# Patient Record
Sex: Female | Born: 1945
Health system: Southern US, Community
[De-identification: ages and names within clinical notes are randomized; demographics above are authoritative.]

## PROBLEM LIST (undated history)

## (undated) DIAGNOSIS — F329 Major depressive disorder, single episode, unspecified: Secondary | ICD-10-CM

## (undated) DIAGNOSIS — Z87898 Personal history of other specified conditions: Secondary | ICD-10-CM

## (undated) DIAGNOSIS — F419 Anxiety disorder, unspecified: Secondary | ICD-10-CM

## (undated) DIAGNOSIS — F32A Depression, unspecified: Secondary | ICD-10-CM

## (undated) DIAGNOSIS — H9190 Unspecified hearing loss, unspecified ear: Secondary | ICD-10-CM

## (undated) DIAGNOSIS — E785 Hyperlipidemia, unspecified: Secondary | ICD-10-CM

## (undated) DIAGNOSIS — J9600 Acute respiratory failure, unspecified whether with hypoxia or hypercapnia: Secondary | ICD-10-CM

## (undated) DIAGNOSIS — I10 Essential (primary) hypertension: Secondary | ICD-10-CM

## (undated) DIAGNOSIS — J302 Other seasonal allergic rhinitis: Secondary | ICD-10-CM

## (undated) DIAGNOSIS — I6529 Occlusion and stenosis of unspecified carotid artery: Secondary | ICD-10-CM

## (undated) DIAGNOSIS — I2510 Atherosclerotic heart disease of native coronary artery without angina pectoris: Secondary | ICD-10-CM

## (undated) DIAGNOSIS — IMO0001 Reserved for inherently not codable concepts without codable children: Secondary | ICD-10-CM

## (undated) DIAGNOSIS — R002 Palpitations: Secondary | ICD-10-CM

## (undated) DIAGNOSIS — Z9981 Dependence on supplemental oxygen: Secondary | ICD-10-CM

## (undated) DIAGNOSIS — J449 Chronic obstructive pulmonary disease, unspecified: Secondary | ICD-10-CM

## (undated) DIAGNOSIS — J441 Chronic obstructive pulmonary disease with (acute) exacerbation: Secondary | ICD-10-CM

## (undated) DIAGNOSIS — K219 Gastro-esophageal reflux disease without esophagitis: Secondary | ICD-10-CM

## (undated) DIAGNOSIS — R0989 Other specified symptoms and signs involving the circulatory and respiratory systems: Secondary | ICD-10-CM

## (undated) DIAGNOSIS — Z515 Encounter for palliative care: Secondary | ICD-10-CM

## (undated) DIAGNOSIS — D649 Anemia, unspecified: Secondary | ICD-10-CM

## (undated) DIAGNOSIS — I499 Cardiac arrhythmia, unspecified: Secondary | ICD-10-CM

## (undated) DIAGNOSIS — M810 Age-related osteoporosis without current pathological fracture: Secondary | ICD-10-CM

## (undated) HISTORY — DX: Chronic obstructive pulmonary disease with (acute) exacerbation: J44.1

## (undated) HISTORY — DX: Occlusion and stenosis of unspecified carotid artery: I65.29

## (undated) HISTORY — PX: VAGINAL HYSTERECTOMY: SUR661

## (undated) HISTORY — PX: TUBAL LIGATION: SHX77

## (undated) HISTORY — DX: Other seasonal allergic rhinitis: J30.2

## (undated) HISTORY — DX: Essential (primary) hypertension: I10

## (undated) HISTORY — PX: CATARACT EXTRACTION, BILATERAL: SHX1313

## (undated) HISTORY — DX: Encounter for palliative care: Z51.5

## (undated) HISTORY — DX: Chronic obstructive pulmonary disease, unspecified: J44.9

## (undated) HISTORY — DX: Other specified symptoms and signs involving the circulatory and respiratory systems: R09.89

## (undated) HISTORY — DX: Acute respiratory failure, unspecified whether with hypoxia or hypercapnia: J96.00

## (undated) HISTORY — DX: Hyperlipidemia, unspecified: E78.5

---

## 2008-01-01 ENCOUNTER — Ambulatory Visit: Payer: Self-pay | Admitting: Gynecology

## 2009-01-04 ENCOUNTER — Ambulatory Visit: Payer: Self-pay

## 2010-01-05 ENCOUNTER — Ambulatory Visit: Payer: Self-pay

## 2011-01-17 ENCOUNTER — Ambulatory Visit: Payer: Self-pay | Admitting: Family Medicine

## 2011-11-22 ENCOUNTER — Emergency Department: Payer: Self-pay | Admitting: Unknown Physician Specialty

## 2012-03-31 ENCOUNTER — Ambulatory Visit: Payer: Self-pay | Admitting: Physician Assistant

## 2013-05-12 ENCOUNTER — Ambulatory Visit: Payer: Self-pay | Admitting: Family Medicine

## 2014-02-25 ENCOUNTER — Inpatient Hospital Stay: Payer: Self-pay | Admitting: Specialist

## 2014-02-25 LAB — CBC
HCT: 38.8 % (ref 35.0–47.0)
HGB: 12.7 g/dL (ref 12.0–16.0)
MCH: 32.4 pg (ref 26.0–34.0)
MCHC: 32.8 g/dL (ref 32.0–36.0)
MCV: 99 fL (ref 80–100)
Platelet: 334 10*3/uL (ref 150–440)
RBC: 3.93 10*6/uL (ref 3.80–5.20)
RDW: 13.9 % (ref 11.5–14.5)
WBC: 17.1 10*3/uL — ABNORMAL HIGH (ref 3.6–11.0)

## 2014-02-25 LAB — COMPREHENSIVE METABOLIC PANEL
ALBUMIN: 2.9 g/dL — AB (ref 3.4–5.0)
ALK PHOS: 186 U/L — AB
Anion Gap: 6 — ABNORMAL LOW (ref 7–16)
BUN: 26 mg/dL — AB (ref 7–18)
Bilirubin,Total: 0.7 mg/dL (ref 0.2–1.0)
CALCIUM: 9.3 mg/dL (ref 8.5–10.1)
CHLORIDE: 101 mmol/L (ref 98–107)
Co2: 27 mmol/L (ref 21–32)
Creatinine: 0.73 mg/dL (ref 0.60–1.30)
EGFR (Non-African Amer.): >60
GLUCOSE: 118 mg/dL — AB (ref 65–99)
OSMOLALITY: 274 (ref 275–301)
Potassium: 3.5 mmol/L (ref 3.5–5.1)
SGOT(AST): 30 U/L (ref 15–37)
SGPT (ALT): 33 U/L (ref 12–78)
Sodium: 134 mmol/L — ABNORMAL LOW (ref 136–145)
Total Protein: 7.3 g/dL (ref 6.4–8.2)

## 2014-02-25 LAB — TROPONIN I: Troponin-I: <0.02 ng/mL

## 2014-02-26 LAB — BASIC METABOLIC PANEL
Anion Gap: 6 — ABNORMAL LOW (ref 7–16)
BUN: 19 mg/dL — ABNORMAL HIGH (ref 7–18)
CALCIUM: 9.2 mg/dL (ref 8.5–10.1)
Chloride: 104 mmol/L (ref 98–107)
Co2: 25 mmol/L (ref 21–32)
Creatinine: 0.55 mg/dL — ABNORMAL LOW (ref 0.60–1.30)
EGFR (Non-African Amer.): >60
Glucose: 125 mg/dL — ABNORMAL HIGH (ref 65–99)
OSMOLALITY: 274 (ref 275–301)
Potassium: 3.7 mmol/L (ref 3.5–5.1)
SODIUM: 135 mmol/L — AB (ref 136–145)

## 2014-02-26 LAB — CBC WITH DIFFERENTIAL/PLATELET
BASOS ABS: 0 10*3/uL (ref 0.0–0.1)
BASOS PCT: 0.1 %
Eosinophil #: 0 10*3/uL (ref 0.0–0.7)
Eosinophil %: 0.1 %
HCT: 35.8 % (ref 35.0–47.0)
HGB: 11.8 g/dL — ABNORMAL LOW (ref 12.0–16.0)
Lymphocyte #: 1.9 10*3/uL (ref 1.0–3.6)
Lymphocyte %: 13.2 %
MCH: 32.7 pg (ref 26.0–34.0)
MCHC: 32.9 g/dL (ref 32.0–36.0)
MCV: 99 fL (ref 80–100)
Monocyte #: 0.5 x10 3/mm (ref 0.2–0.9)
Monocyte %: 3.8 %
Neutrophil #: 11.9 10*3/uL — ABNORMAL HIGH (ref 1.4–6.5)
Neutrophil %: 82.8 %
Platelet: 313 10*3/uL (ref 150–440)
RBC: 3.6 10*6/uL — AB (ref 3.80–5.20)
RDW: 13.9 % (ref 11.5–14.5)
WBC: 14.4 10*3/uL — AB (ref 3.6–11.0)

## 2014-02-26 LAB — LIPID PANEL
CHOLESTEROL: 110 mg/dL (ref 0–200)
HDL Cholesterol: 13 mg/dL — ABNORMAL LOW (ref 40–60)
Ldl Cholesterol, Calc: 57 mg/dL (ref 0–100)
TRIGLYCERIDES: 199 mg/dL (ref 0–200)
VLDL Cholesterol, Calc: 40 mg/dL (ref 5–40)

## 2014-02-26 LAB — MAGNESIUM: Magnesium: 2.1 mg/dL

## 2014-02-26 LAB — TSH: Thyroid Stimulating Horm: 0.193 u[IU]/mL — ABNORMAL LOW

## 2014-03-01 LAB — EXPECTORATED SPUTUM ASSESSMENT W GRAM STAIN, RFLX TO RESP C

## 2014-03-02 LAB — CULTURE, BLOOD (SINGLE)

## 2014-03-26 ENCOUNTER — Ambulatory Visit: Payer: Self-pay | Admitting: Family Medicine

## 2015-03-11 DIAGNOSIS — J302 Other seasonal allergic rhinitis: Secondary | ICD-10-CM | POA: Diagnosis not present

## 2015-03-11 DIAGNOSIS — R0989 Other specified symptoms and signs involving the circulatory and respiratory systems: Secondary | ICD-10-CM | POA: Diagnosis not present

## 2015-03-11 DIAGNOSIS — J449 Chronic obstructive pulmonary disease, unspecified: Secondary | ICD-10-CM | POA: Diagnosis not present

## 2015-03-11 DIAGNOSIS — I10 Essential (primary) hypertension: Secondary | ICD-10-CM | POA: Diagnosis not present

## 2015-03-11 DIAGNOSIS — F418 Other specified anxiety disorders: Secondary | ICD-10-CM | POA: Diagnosis not present

## 2015-03-11 DIAGNOSIS — R42 Dizziness and giddiness: Secondary | ICD-10-CM | POA: Diagnosis not present

## 2015-03-11 LAB — BASIC METABOLIC PANEL
BUN: 8 mg/dL (ref 4–21)
Creatinine: 0.6 mg/dL (ref 0.5–1.1)
GLUCOSE: 107 mg/dL
Potassium: 5.4 mmol/L — AB (ref 3.4–5.3)
SODIUM: 137 mmol/L (ref 137–147)

## 2015-03-11 LAB — CBC AND DIFFERENTIAL
HEMATOCRIT: 45 % (ref 36–46)
Hemoglobin: 15 g/dL (ref 12.0–16.0)
NEUTROS ABS: 62 /uL
PLATELETS: 362 10*3/uL (ref 150–399)
WBC: 9.2 10^3/mL

## 2015-03-11 LAB — HEPATIC FUNCTION PANEL
ALT: 11 U/L (ref 7–35)
AST: 16 U/L (ref 13–35)
Alkaline Phosphatase: 97 U/L (ref 25–125)
BILIRUBIN, TOTAL: 0.2 mg/dL

## 2015-03-17 ENCOUNTER — Ambulatory Visit
Admit: 2015-03-17 | Disposition: A | Discharge status: Home / Self Care | Payer: Self-pay | Attending: Family Medicine | Admitting: Family Medicine

## 2015-03-17 DIAGNOSIS — R0989 Other specified symptoms and signs involving the circulatory and respiratory systems: Secondary | ICD-10-CM | POA: Diagnosis not present

## 2015-03-17 DIAGNOSIS — I6523 Occlusion and stenosis of bilateral carotid arteries: Secondary | ICD-10-CM | POA: Diagnosis not present

## 2015-03-17 DIAGNOSIS — R42 Dizziness and giddiness: Secondary | ICD-10-CM | POA: Diagnosis not present

## 2015-03-25 DIAGNOSIS — J449 Chronic obstructive pulmonary disease, unspecified: Secondary | ICD-10-CM | POA: Diagnosis not present

## 2015-03-25 DIAGNOSIS — G459 Transient cerebral ischemic attack, unspecified: Secondary | ICD-10-CM | POA: Diagnosis not present

## 2015-03-25 DIAGNOSIS — I6529 Occlusion and stenosis of unspecified carotid artery: Secondary | ICD-10-CM | POA: Diagnosis not present

## 2015-03-25 DIAGNOSIS — F17200 Nicotine dependence, unspecified, uncomplicated: Secondary | ICD-10-CM | POA: Diagnosis not present

## 2015-04-02 NOTE — Discharge Summary (Signed)
PATIENT NAME:  Hailey Gibbs, Hailey Gibbs MR#:  [id number] DATE OF BIRTH:  1946-10-17  DATE OF ADMISSION:  02/25/2014 DATE OF DISCHARGE:  02/28/2014  For a detailed note, please take a look at the history and physical done on admission by Dr. Imogene Burnhen.   DIAGNOSES AT DISCHARGE: As follows: 1.  Chronic obstructive pulmonary disease exacerbation.  2.  Acute bronchitis.  3.  Hypertension.  4.  Gastroesophageal reflux disease.  5.  Depression.   DIET: The patient is being discharged on a low-sodium diet.   ACTIVITY: As tolerated.   FOLLOWUP: With Dr. Joanie Coddingtonennis Crissman in the next 1 to 2 weeks.   DISCHARGE MEDICATIONS:  Albuterol inhaler 1 to 2 puffs q.i.d. as needed, lisinopril 5 mg daily, Zyrtec 10 mg daily, Zoloft 50 mg at bedtime, multivitamin daily, prednisone taper starting at 60 mg down to 10 mg over the next 6 days, Levaquin 5 mg daily x 5 days, Spiriva 1 puff daily and Symbicort 160/4.5, 2 puffs b.i.d.   PERTINENT STUDIES DONE DURING THE HOSPITAL COURSE: As follows: A chest x-ray done on admission showing mild interstitial prominence bilaterally, suspicious for edema or pneumonitis and hyperinflation. Blood cultures and sputum cultures noted to be negative.   HOSPITAL COURSE: This is a 69 year old female with medical problems as mentioned above, presented to the hospital with shortness of breath, wheezing and noted to be in COPD exacerbation.  1.  COPD exacerbation. This would likely cause the patient's shortness of breath, wheezing and cough. This was likely secondary to ongoing tobacco abuse and possible underlying bronchitis. The patient's chest x-ray did not show any evidence of focal infiltrate. The patient was started on aggressive therapy with IV steroids, around-the-clock nebulizer treatments, also started on IV Levaquin. The patient also started on some Spiriva. The patient's clinical symptoms have significantly improved since admission. She is less bronchospastic and wheezing. She was  ambulated on room air. Did desaturate below 88%; therefore, qualifies for home oxygen, which is being arranged for her. At this point, since she is clinically feeling much better, she is being discharged home on oral prednisone taper along with Levaquin and oxygen at home. She will also continue some Symbicort and Spiriva and have follow up with her primary care physician as an outpatient.  2.  Hypertension. The patient remained hemodynamically stable. She will continue her lisinopril.  3.  Depression. The patient was maintained on her Zoloft and she will resume that.  4.  Tobacco abuse. The patient was strongly advised to quit smoking and was maintained on nicotine patch while in the hospital.  5.  GERD. The patient was maintained on her Protonix and she will resume that.   CODE STATUS: The patient is a full code.   TIME SPENT ON DISCHARGE: 40 minutes. ____________________________ Rolly PancakeVivek J. Cherlynn KaiserSainani, MD vjs:aw D: 02/28/2014 16:12:24 ET T: 03/01/2014 06:45:36 ET JOB#: [id number]  cc: Rolly PancakeVivek J. Cherlynn KaiserSainani, MD, <Dictator> Joanie CoddingtonENNIS CRISSMAN, MD Houston SirenVIVEK J Stephan Draughn MD ELECTRONICALLY SIGNED 03/05/2014 20:27

## 2015-04-02 NOTE — H&P (Signed)
PATIENT NAME:  Hailey Gibbs, Hailey Gibbs MR#:  [id number] DATE OF BIRTH:  10-06-1946  DATE OF ADMISSION:  02/25/2014  PRIMARY CARE PHYSICIAN: Dr. Shella Spearing  REFERRING PHYSICIAN: Dr. Scotty Court  CHIEF COMPLAINT: Shortness of breath, cough, sputum 4 days.   HISTORY OF PRESENT ILLNESS: A 69 year old Caucasian female with a history of COPD, hypertension, depression, anxiety presenting  to the ED with the above chief complaint. The patient is alert, awake, oriented, in no acute distress. The patient said she started to have shortness of breath, cough, sputum, four days ago. These symptoms have been worsening, so she came to the ED for further evaluation today. The patient chest x-ray showed pneumonia. The patient's ABG showed, pO2 of 56. The patient was treated with nebulizer and put on BiPAP.  The patient was also treated with Levaquin. Blood culture were sent.   PAST MEDICAL HISTORY: COPD, hypertension, depression, anxiety.   SOCIAL HISTORY: Smokes 1 pack a day for 30 years   PAST SURGICAL HISTORY: Hysterectomy.   FAMILY HISTORY: Mother has dementia, COPD, deceased. Brother has Parkinson disease. Father had coronary artery disease and died of pneumonia.   HOME MEDICATIONS: Zyrtec 10 mg p.o. daily, sertraline 50 mg p.o. at bedtime.  ProAir HFA CFC 90 mcg 1 to 2 tabs 4 times a day p.r.n., multivitamin 1 tablet p.o. daily, lisinopril 5 mg p.o. daily, Dulera 5/200 mcg inhalation 1 to 2 tabs b.i.d.   REVIEW OF SYSTEMS:  CONSTITUTIONAL: The patient denies any fever, chills. No headache or dizziness, but feels hot.  CARDIOVASCULAR: No chest pain, palpitation, orthopnea, no nocturnal dyspnea. No leg edema.  EYES: No double vision, blurred vision.  ENT: No postnasal drip, slurred speech or dysphagia.  PULMONARY: Cough, sputum, shortness of breath, wheezing, but no hemoptysis.  GASTROINTESTINAL: No abdominal pain, nausea, vomiting, diarrhea.  GENITOURINARY: No dysuria, hematuria, or incontinence.  SKIN: No  rash or jaundice.  NEUROLOGIC: No syncope, loss of consciousness or seizure.  ENDOCRINOLOGY: No polyuria, polydipsia, heat or cold intolerance.  HEMATOLOGY: No easy bruising or bleeding.   PHYSICAL EXAMINATION: VITAL SIGNS: Temperature 97.9, blood pressure 114/52, pulse 126, respirations 28, oxygen saturation 90% on oxygen.  GENERAL: The patient is alert, awake, oriented, in no acute distress.  HEENT: Pupils round, equal, and reactive to light and accommodation.  NECK: Supple. No JVD or carotid bruit. No lymphadenopathy. No thyromegaly.  CARDIOVASCULAR: S1 and S2, regular rate and tachycardia. No murmurs or gallops.  PULMONARY: Very weak breath sounds with expiratory wheezing and use of accessory muscle to breathe.  ABDOMEN: Soft. No distention or tenderness. No organomegaly. Bowel sounds present.  EXTREMITIES: No edema, clubbing or cyanosis. No calf tenderness and pedal pulses present.  SKIN: No rash or jaundice.  NEUROLOGIC: A and O x 3. No focal deficit. Power 5/5. Sensation intact.   LABORATORY AND DIAGNOSTICS: The chest x-ray showed  Mild interstitial prominence bilaterally suspicious for edema or pneumonitis. Streaky bilateral basilar atelectasis or early infiltrate.  ABG showed pH 7.3, pCO2 of 49, pO2 56 with FiO2 of 36, lactic acid 1.2. WBC 7.1, hemoglobin 12.7, platelets 334. Glucose 118, BUN 26, creatinine 0.7, sodium 134, potassium 3.5, chloride 101, bicarbonate 27. Troponin less than 0.02. ABG shows sinus tachycardia at 121 BPM.   IMPRESSION: 1. Acute respiratory failure with chronic obstructive pulmonary disease exacerbation.  2. Systemic inflammatory response syndrome.  3. Pneumonia.  4. Dehydration.  5. Hypertension.  6. Tobacco abuse.   PLAN OF TREATMENT: 1. The patient will be admitted to medical floor with  a BiPAP p.r.n. give Xopenex p.r.n. and start some Solu-Medrol.  Continue Levaquin. Follow-up CBC, blood culture, and sputum culture.  2. For dehydration, will give  normal saline IV. Follow up BMP. 3.  Hypertension. We will continue lisinopril.  4. Tobacco abuse, smoking cessation was counseled for five minutes. We will give nicotine patch.  5. I discussed the patient's critical condition with the patient.   TIME SPENT: About 56 minutes.    ____________________________ Shaune PollackQing Yoshi Vicencio, MD qc:sg D: 02/25/2014 13:50:00 ET T: 02/25/2014 14:20:50 ET JOB#: [id number]  cc: Shaune PollackQing Shomari Matusik, MD, <Dictator> Shaune PollackQING Detrick Dani MD ELECTRONICALLY SIGNED 02/26/2014 14:36

## 2015-04-08 ENCOUNTER — Ambulatory Visit
Admit: 2015-04-08 | Disposition: A | Discharge status: Home / Self Care | Payer: Self-pay | Attending: Vascular Surgery | Admitting: Vascular Surgery

## 2015-04-08 DIAGNOSIS — I672 Cerebral atherosclerosis: Secondary | ICD-10-CM | POA: Diagnosis not present

## 2015-04-08 DIAGNOSIS — I6523 Occlusion and stenosis of bilateral carotid arteries: Secondary | ICD-10-CM | POA: Diagnosis not present

## 2015-04-08 DIAGNOSIS — I6522 Occlusion and stenosis of left carotid artery: Secondary | ICD-10-CM | POA: Diagnosis not present

## 2015-04-08 DIAGNOSIS — I708 Atherosclerosis of other arteries: Secondary | ICD-10-CM | POA: Diagnosis not present

## 2015-04-08 DIAGNOSIS — I6521 Occlusion and stenosis of right carotid artery: Secondary | ICD-10-CM | POA: Diagnosis not present

## 2015-04-12 DIAGNOSIS — F17200 Nicotine dependence, unspecified, uncomplicated: Secondary | ICD-10-CM | POA: Diagnosis not present

## 2015-04-12 DIAGNOSIS — I6529 Occlusion and stenosis of unspecified carotid artery: Secondary | ICD-10-CM | POA: Diagnosis not present

## 2015-04-12 DIAGNOSIS — J449 Chronic obstructive pulmonary disease, unspecified: Secondary | ICD-10-CM | POA: Diagnosis not present

## 2015-04-12 DIAGNOSIS — G459 Transient cerebral ischemic attack, unspecified: Secondary | ICD-10-CM | POA: Diagnosis not present

## 2015-05-30 ENCOUNTER — Ambulatory Visit (INDEPENDENT_AMBULATORY_CARE_PROVIDER_SITE_OTHER): Payer: Commercial Managed Care - HMO | Admitting: Cardiovascular Disease

## 2015-05-30 ENCOUNTER — Encounter (INDEPENDENT_AMBULATORY_CARE_PROVIDER_SITE_OTHER): Payer: Self-pay

## 2015-05-30 ENCOUNTER — Encounter: Payer: Self-pay | Admitting: Cardiovascular Disease

## 2015-05-30 VITALS — BP 122/62 | HR 77 | Ht 65.0 in | Wt 142.5 lb

## 2015-05-30 DIAGNOSIS — R0602 Shortness of breath: Secondary | ICD-10-CM | POA: Diagnosis not present

## 2015-05-30 DIAGNOSIS — E785 Hyperlipidemia, unspecified: Secondary | ICD-10-CM | POA: Insufficient documentation

## 2015-05-30 DIAGNOSIS — Z01810 Encounter for preprocedural cardiovascular examination: Secondary | ICD-10-CM

## 2015-05-30 DIAGNOSIS — Z720 Tobacco use: Secondary | ICD-10-CM | POA: Insufficient documentation

## 2015-05-30 DIAGNOSIS — Z7189 Other specified counseling: Secondary | ICD-10-CM | POA: Insufficient documentation

## 2015-05-30 NOTE — Progress Notes (Signed)
HPI  This is a 69 year old pleasant female who was referred by Dr. Wyn Quaker for preoperative cardiovascular evaluation before left carotid endarterectomy. The patient has known history of COPD, prolonged tobacco use and hyperlipidemia. She has no previous cardiac history. She was suspected of having TIA recently and underwent carotid Doppler which showed significant left carotid stenosis and moderate right carotid stenosis. The patient denies any chest discomfort. She has chronic exertional dyspnea which she thinks related to COPD. Her functional capacity is reduced due to her lung disease but overall she is able to perform activities of daily living she does have high work without significant limitations.  There is no family history of premature coronary artery disease.  No Known Allergies   No current outpatient prescriptions on file prior to visit.   No current facility-administered medications on file prior to visit.     Past Medical History  Diagnosis Date  . Carotid artery occlusion   . Hypertension   . COPD (chronic obstructive pulmonary disease)   . Seasonal allergies   . Hyperlipidemia      Past Surgical History  Procedure Laterality Date  . Vaginal hysterectomy       Family History  Problem Relation Age of Onset  . Heart disease Father 63    CABG   . Hyperlipidemia Father      History   Social History  . Marital Status: Divorced    Spouse Name: N/A  . Number of Children: N/A  . Years of Education: N/A   Occupational History  . Not on file.   Social History Main Topics  . Smoking status: Current Every Day Smoker -- 1.00 packs/day for 45 years    Types: Cigarettes  . Smokeless tobacco: Not on file  . Alcohol Use: No  . Drug Use: No  . Sexual Activity: Not on file   Other Topics Concern  . Not on file   Social History Narrative  . No narrative on file     ROS A 10 point review of system was performed. It is negative other than that mentioned in  the history of present illness.   PHYSICAL EXAM   BP 122/62 mmHg  Pulse 77  Ht 5\' 5"  (1.651 m)  Wt 142 lb 8 oz (64.638 kg)  BMI 23.71 kg/m2 Constitutional: She is oriented to person, place, and time. She appears well-developed and well-nourished. No distress.  HENT: No nasal discharge.  Head: Normocephalic and atraumatic.  Eyes: Pupils are equal and round. No discharge.  Neck: Normal range of motion. Neck supple. No JVD present. No thyromegaly present. There is a faint right carotid bruit Cardiovascular: Normal rate, regular rhythm, normal heart sounds. Exam reveals no gallop and no friction rub. No murmur heard.  Pulmonary/Chest: Effort normal and breath sounds normal. No stridor. No respiratory distress. She has no wheezes. She has no rales. She exhibits no tenderness.  Abdominal: Soft. Bowel sounds are normal. She exhibits no distension. There is no tenderness. There is no rebound and no guarding.  Musculoskeletal: Normal range of motion. She exhibits no edema and no tenderness.  Neurological: She is alert and oriented to person, place, and time. Coordination normal.  Skin: Skin is warm and dry. No rash noted. She is not diaphoretic. No erythema. No pallor.  Psychiatric: She has a normal mood and affect. Her behavior is normal. Judgment and thought content normal.     NGE:XBMWU  Rhythm  -  Nonspecific T-abnormality.   ABNORMAL  ASSESSMENT AND PLAN

## 2015-05-30 NOTE — Assessment & Plan Note (Signed)
Given recent diagnosis of carotid atherosclerosis, I agree with treatment with a statin with a target LDL of less than 70.

## 2015-05-30 NOTE — Assessment & Plan Note (Signed)
I discussed with the patient the importance of smoking cessation but she is not able to quit at the present time.

## 2015-05-30 NOTE — Patient Instructions (Addendum)
Medication Instructions:  Your physician recommends that you continue on your current medications as directed. Please refer to the Current Medication list given to you today.   Labwork: none  Testing/Procedures: Your physician has requested that you have a lexiscan myoview.   [facility] MYOVIEW  Your caregiver has ordered a Stress Test with nuclear imaging. The purpose of this test is to evaluate the blood supply to your heart muscle. This procedure is referred to as a "Non-Invasive Stress Test." This is because other than having an IV started in your vein, nothing is inserted or "invades" your body. Cardiac stress tests are done to find areas of poor blood flow to the heart by determining the extent of coronary artery disease (CAD). Some patients exercise on a treadmill, which naturally increases the blood flow to your heart, while others who are  unable to walk on a treadmill due to physical limitations have a pharmacologic/chemical stress agent called Lexiscan . This medicine will mimic walking on a treadmill by temporarily increasing your coronary blood flow.   Please note: these test may take anywhere between 2-4 hours to complete  PLEASE REPORT TO [facility] MEDICAL MALL ENTRANCE  THE VOLUNTEERS AT THE FIRST DESK WILL DIRECT YOU WHERE TO GO  Date of Procedure: Thursday, June 23, 8:00am  Arrival Time for Procedure: 7:30am _ Instructions regarding medication:   PLEASE NOTIFY THE OFFICE AT LEAST 24 HOURS IN ADVANCE IF YOU ARE UNABLE TO KEEP YOUR APPOINTMENT.  [phone] AND  PLEASE NOTIFY NUCLEAR MEDICINE AT [facility] AT LEAST 24 HOURS IN ADVANCE IF YOU ARE UNABLE TO KEEP YOUR APPOINTMENT. [phone]  How to prepare for your Myoview test:  1. Do not eat or drink after midnight 2. No caffeine for 24 hours prior to test 3. No smoking 24 hours prior to test. 4. Your medication may be taken with water.  If your doctor stopped a medication because of this test, do not take that  medication. 5. Ladies, please do not wear dresses.  Skirts or pants are appropriate. Please wear a short sleeve shirt. 6. No perfume, cologne or lotion. 7. Wear comfortable walking shoes. No heels!            Follow-Up: Your physician recommends that you schedule a follow-up appointment as needed with Dr. Kirke Corin.    Any Other Special Instructions Will Be Listed Below (If Applicable).

## 2015-05-30 NOTE — Assessment & Plan Note (Signed)
The patient has chronic exertional dyspnea which could be related to COPD. However, she has not had any recent stress testing. She has multiple risk factors for coronary artery disease and has already established history of atherosclerosis manifested by significant carotid stenosis. Functional capacity is reduced due to exertional dyspnea. Physical exam does not reveal any cardiac murmurs. ECG with nonspecific T wave changes. I requested a pharmacologic nuclear stress test for risk stratification. She is not able to exercise on a treadmill due to COPD. If stress test is low risk, the patient can proceed with surgery at an overall low risk from a cardiac standpoint.

## 2015-06-02 ENCOUNTER — Encounter
Admission: RE | Admit: 2015-06-02 | Discharge: 2015-06-02 | Disposition: A | Discharge status: Home / Self Care | Payer: Commercial Managed Care - HMO | Source: Ambulatory Visit | Attending: Cardiovascular Disease | Admitting: Cardiovascular Disease

## 2015-06-02 DIAGNOSIS — R0602 Shortness of breath: Secondary | ICD-10-CM | POA: Insufficient documentation

## 2015-06-02 LAB — NM MYOCAR MULTI W/SPECT W/WALL MOTION / EF
CHL CUP RESTING HR STRESS: 82 {beats}/min
LV dias vol: 51 mL
LV sys vol: 21 mL
NUC STRESS TID: 1.02
Peak HR: 104 {beats}/min
Percent HR: 68 %
SDS: 3
SRS: 4
SSS: 0

## 2015-06-02 MED ORDER — TECHNETIUM TC 99M SESTAMIBI - CARDIOLITE
29.8300 | Freq: Once | INTRAVENOUS | Status: AC | PRN
  Administered 2015-06-02: 09:00:00 29.83 via INTRAVENOUS

## 2015-06-02 MED ORDER — TECHNETIUM TC 99M SESTAMIBI - CARDIOLITE
12.2650 | Freq: Once | INTRAVENOUS | Status: AC | PRN
  Administered 2015-06-02: 12.265 via INTRAVENOUS

## 2015-06-02 MED ORDER — REGADENOSON 0.4 MG/5ML IV SOLN
0.4000 mg | Freq: Once | INTRAVENOUS | Status: AC
  Administered 2015-06-02: 0.4 mg via INTRAVENOUS

## 2015-06-14 ENCOUNTER — Other Ambulatory Visit: Payer: Self-pay | Admitting: Family Medicine

## 2015-06-16 ENCOUNTER — Encounter
Admission: RE | Admit: 2015-06-16 | Discharge: 2015-06-16 | Disposition: A | Discharge status: Home / Self Care | Payer: Commercial Managed Care - HMO | Source: Ambulatory Visit | Attending: Vascular Surgery | Admitting: Vascular Surgery

## 2015-06-16 DIAGNOSIS — E785 Hyperlipidemia, unspecified: Secondary | ICD-10-CM | POA: Diagnosis not present

## 2015-06-16 DIAGNOSIS — Z01812 Encounter for preprocedural laboratory examination: Secondary | ICD-10-CM | POA: Insufficient documentation

## 2015-06-16 DIAGNOSIS — I6529 Occlusion and stenosis of unspecified carotid artery: Secondary | ICD-10-CM | POA: Diagnosis not present

## 2015-06-16 DIAGNOSIS — J449 Chronic obstructive pulmonary disease, unspecified: Secondary | ICD-10-CM | POA: Diagnosis not present

## 2015-06-16 DIAGNOSIS — F329 Major depressive disorder, single episode, unspecified: Secondary | ICD-10-CM | POA: Diagnosis not present

## 2015-06-16 DIAGNOSIS — F17200 Nicotine dependence, unspecified, uncomplicated: Secondary | ICD-10-CM | POA: Diagnosis not present

## 2015-06-16 DIAGNOSIS — Z01810 Encounter for preprocedural cardiovascular examination: Secondary | ICD-10-CM | POA: Diagnosis not present

## 2015-06-16 DIAGNOSIS — I10 Essential (primary) hypertension: Secondary | ICD-10-CM | POA: Diagnosis not present

## 2015-06-16 DIAGNOSIS — G459 Transient cerebral ischemic attack, unspecified: Secondary | ICD-10-CM | POA: Diagnosis not present

## 2015-06-16 HISTORY — DX: Gastro-esophageal reflux disease without esophagitis: K21.9

## 2015-06-16 HISTORY — DX: Depression, unspecified: F32.A

## 2015-06-16 HISTORY — DX: Major depressive disorder, single episode, unspecified: F32.9

## 2015-06-16 HISTORY — DX: Reserved for inherently not codable concepts without codable children: IMO0001

## 2015-06-16 HISTORY — DX: Anxiety disorder, unspecified: F41.9

## 2015-06-16 LAB — CBC
HCT: 44.1 % (ref 35.0–47.0)
HEMOGLOBIN: 14.3 g/dL (ref 12.0–16.0)
MCH: 31.8 pg (ref 26.0–34.0)
MCHC: 32.5 g/dL (ref 32.0–36.0)
MCV: 97.8 fL (ref 80.0–100.0)
Platelets: 335 10*3/uL (ref 150–440)
RBC: 4.51 MIL/uL (ref 3.80–5.20)
RDW: 13.6 % (ref 11.5–14.5)
WBC: 8.7 10*3/uL (ref 3.6–11.0)

## 2015-06-16 LAB — PROTIME-INR
INR: 0.87
Prothrombin Time: 12 seconds (ref 11.4–15.0)

## 2015-06-16 LAB — TYPE AND SCREEN
ABO/RH(D): A POS
ANTIBODY SCREEN: NEGATIVE

## 2015-06-16 LAB — BASIC METABOLIC PANEL
Anion gap: 8 (ref 5–15)
BUN: 9 mg/dL (ref 6–20)
CALCIUM: 9.7 mg/dL (ref 8.9–10.3)
CHLORIDE: 102 mmol/L (ref 101–111)
CO2: 29 mmol/L (ref 22–32)
Creatinine, Ser: 0.66 mg/dL (ref 0.44–1.00)
GFR calc non Af Amer: >60 mL/min (ref >60)
Glucose, Bld: 106 mg/dL — ABNORMAL HIGH (ref 65–99)
Potassium: 4.1 mmol/L (ref 3.5–5.1)
SODIUM: 139 mmol/L (ref 135–145)

## 2015-06-16 LAB — ABO/RH: ABO/RH(D): A POS

## 2015-06-16 LAB — APTT: aPTT: 31 seconds (ref 24–36)

## 2015-06-16 NOTE — Patient Instructions (Signed)
  Your procedure is scheduled on: June 23, 2015 (Thursday) Report to Day Surgery. To find out your arrival time please call [phone] between 1PM - 3PM on June 22, 2015 (Wednesday).  Remember: Instructions that are not followed completely may result in serious medical risk, up to and including death, or upon the discretion of your surgeon and anesthesiologist your surgery may need to be rescheduled.    __x__ 1. Do not eat food or drink liquids after midnight. No gum chewing or hard candies.     ____ 2. No Alcohol for 24 hours before or after surgery.   ____ 3. Bring all medications with you on the day of surgery if instructed.    __x__ 4. Notify your doctor if there is any change in your medical condition     (cold, fever, infections).     Do not wear jewelry, make-up, hairpins, clips or nail polish.  Do not wear lotions, powders, or perfumes. You may wear deodorant.  Do not shave 48 hours prior to surgery. Men may shave face and neck.  Do not bring valuables to the hospital.    [facility] not [facility] any belongings or valuables.               Contacts, dentures or bridgework may not be worn into surgery.  Leave your suitcase in the car. After surgery it may be brought to your room.  For patients admitted to the hospital, discharge time is determined by your                treatment team.   Patients discharged the day of surgery will not be allowed to drive home.   Please read over the following fact sheets that you were given:   Surgical Site Infection Prevention   ____ Take these medicines the morning of surgery with A SIP OF WATER:    1. Atorvastatin  2. Zantac (Zantac at bedtime on July 13)  3.   4.  5.  6.  ____ Fleet Enema (as directed)   __x__ Use CHG Soap as directed  __x__ Use inhalers on the day of surgery (Ventolin, Spiriva, and Symbicort, bring to hospital)  ____ Stop metformin 2 days prior to surgery    ____ Take 1/2 of usual insulin dose  the night before surgery and none on the morning of surgery.   __x__ Stop Coumadin/Plavix/aspirin on (Continue aspirin ,but do not take day of surgery)  ____ Stop Anti-inflammatories on    ____ Stop supplements until after surgery.    ____ Bring C-Pap to the hospital.

## 2015-06-23 ENCOUNTER — Inpatient Hospital Stay: Payer: Commercial Managed Care - HMO | Admitting: Anesthesiology

## 2015-06-23 ENCOUNTER — Encounter
Admission: RE | Disposition: A | Discharge status: Home / Self Care | Payer: Self-pay | Source: Ambulatory Visit | Attending: Vascular Surgery

## 2015-06-23 ENCOUNTER — Inpatient Hospital Stay
Admission: RE | Admit: 2015-06-23 | Discharge: 2015-06-24 | DRG: 039 | Disposition: A | Discharge status: Home / Self Care | Payer: Commercial Managed Care - HMO | Source: Ambulatory Visit | Attending: Vascular Surgery | Admitting: Vascular Surgery

## 2015-06-23 ENCOUNTER — Encounter: Payer: Self-pay | Admitting: *Deleted

## 2015-06-23 DIAGNOSIS — J449 Chronic obstructive pulmonary disease, unspecified: Secondary | ICD-10-CM | POA: Diagnosis not present

## 2015-06-23 DIAGNOSIS — K219 Gastro-esophageal reflux disease without esophagitis: Secondary | ICD-10-CM | POA: Diagnosis not present

## 2015-06-23 DIAGNOSIS — F329 Major depressive disorder, single episode, unspecified: Secondary | ICD-10-CM | POA: Diagnosis present

## 2015-06-23 DIAGNOSIS — Z8673 Personal history of transient ischemic attack (TIA), and cerebral infarction without residual deficits: Secondary | ICD-10-CM | POA: Diagnosis not present

## 2015-06-23 DIAGNOSIS — Z7982 Long term (current) use of aspirin: Secondary | ICD-10-CM

## 2015-06-23 DIAGNOSIS — F17210 Nicotine dependence, cigarettes, uncomplicated: Secondary | ICD-10-CM | POA: Diagnosis present

## 2015-06-23 DIAGNOSIS — I252 Old myocardial infarction: Secondary | ICD-10-CM | POA: Diagnosis not present

## 2015-06-23 DIAGNOSIS — I6529 Occlusion and stenosis of unspecified carotid artery: Secondary | ICD-10-CM

## 2015-06-23 DIAGNOSIS — I6522 Occlusion and stenosis of left carotid artery: Principal | ICD-10-CM | POA: Diagnosis present

## 2015-06-23 DIAGNOSIS — I10 Essential (primary) hypertension: Secondary | ICD-10-CM | POA: Diagnosis present

## 2015-06-23 DIAGNOSIS — I739 Peripheral vascular disease, unspecified: Secondary | ICD-10-CM | POA: Diagnosis present

## 2015-06-23 HISTORY — PX: ENDARTERECTOMY: SHX5162

## 2015-06-23 HISTORY — DX: Occlusion and stenosis of unspecified carotid artery: I65.29

## 2015-06-23 LAB — GLUCOSE, CAPILLARY
GLUCOSE-CAPILLARY: 176 mg/dL — AB (ref 65–99)
GLUCOSE-CAPILLARY: 127 mg/dL — AB (ref 65–99)

## 2015-06-23 LAB — MRSA PCR SCREENING: MRSA BY PCR: NEGATIVE

## 2015-06-23 SURGERY — ENDARTERECTOMY, CAROTID
Anesthesia: General | Laterality: Left | Wound class: Clean

## 2015-06-23 SURGERY — EVACUATION HEMATOMA
Anesthesia: Choice

## 2015-06-23 MED ORDER — OXYCODONE HCL 5 MG PO TABS: 5.0000 mg | ORAL_TABLET | Freq: Once | ORAL | Status: AC | PRN

## 2015-06-23 MED ORDER — ALFENTANIL 500 MCG/ML IJ INJ
INJECTION | INTRAMUSCULAR | Status: DC | PRN
  Administered 2015-06-23: 1000 ug via INTRAVENOUS

## 2015-06-23 MED ORDER — METOPROLOL TARTRATE 1 MG/ML IV SOLN: 2.0000 mg | INTRAVENOUS | Status: DC | PRN

## 2015-06-23 MED ORDER — PHENYLEPHRINE 8 MG IN D5W 100 ML (0.08MG/ML) PREMIX OPTIME
INJECTION | INTRAVENOUS | Status: DC | PRN
  Administered 2015-06-23: 20 ug/min via INTRAVENOUS

## 2015-06-23 MED ORDER — PHENOL 1.4 % MT LIQD
1.0000 | OROMUCOSAL | Status: DC | PRN
  Filled 2015-06-23: qty 177

## 2015-06-23 MED ORDER — INSULIN ASPART 100 UNIT/ML ~~LOC~~ SOLN: 0.0000 [IU] | Freq: Every day | SUBCUTANEOUS | Status: DC

## 2015-06-23 MED ORDER — ACETAMINOPHEN 650 MG RE SUPP: 325.0000 mg | RECTAL | Status: DC | PRN

## 2015-06-23 MED ORDER — FAMOTIDINE IN NACL 20-0.9 MG/50ML-% IV SOLN
20.0000 mg | Freq: Two times a day (BID) | INTRAVENOUS | Status: DC
  Administered 2015-06-23: 20 mg via INTRAVENOUS
  Filled 2015-06-23 (×4): qty 50

## 2015-06-23 MED ORDER — MAGNESIUM SULFATE 2 GM/50ML IV SOLN
2.0000 g | Freq: Every day | INTRAVENOUS | Status: DC | PRN
  Filled 2015-06-23: qty 50

## 2015-06-23 MED ORDER — IPRATROPIUM-ALBUTEROL 0.5-2.5 (3) MG/3ML IN SOLN
RESPIRATORY_TRACT | Status: AC
  Administered 2015-06-23: 3 mL
  Filled 2015-06-23: qty 3

## 2015-06-23 MED ORDER — NITROGLYCERIN IN D5W 200-5 MCG/ML-% IV SOLN
INTRAVENOUS | Status: AC
  Administered 2015-06-23: 10 ug/min via INTRAVENOUS
  Filled 2015-06-23: qty 250

## 2015-06-23 MED ORDER — SODIUM CHLORIDE 0.9 % IV SOLN
INTRAVENOUS | Status: DC
  Administered 2015-06-23: 18:00:00 via INTRAVENOUS

## 2015-06-23 MED ORDER — HEPARIN SODIUM (PORCINE) 1000 UNIT/ML IJ SOLN
INTRAMUSCULAR | Status: AC
  Filled 2015-06-23: qty 1

## 2015-06-23 MED ORDER — INSULIN ASPART 100 UNIT/ML ~~LOC~~ SOLN: 0.0000 [IU] | Freq: Three times a day (TID) | SUBCUTANEOUS | Status: DC

## 2015-06-23 MED ORDER — PROPOFOL 10 MG/ML IV BOLUS
INTRAVENOUS | Status: DC | PRN
  Administered 2015-06-23: 150 mg via INTRAVENOUS
  Administered 2015-06-23: 50 mg via INTRAVENOUS

## 2015-06-23 MED ORDER — CEFAZOLIN SODIUM 1-5 GM-% IV SOLN
1.0000 g | Freq: Once | INTRAVENOUS | Status: AC
  Administered 2015-06-23: 1000 mg via INTRAVENOUS

## 2015-06-23 MED ORDER — ACETAMINOPHEN 325 MG PO TABS
325.0000 mg | ORAL_TABLET | ORAL | Status: DC | PRN
  Administered 2015-06-24: 650 mg via ORAL
  Filled 2015-06-23: qty 2

## 2015-06-23 MED ORDER — PROMETHAZINE HCL 25 MG/ML IJ SOLN
INTRAMUSCULAR | Status: AC
  Administered 2015-06-23: 6.25 mg via INTRAVENOUS
  Filled 2015-06-23: qty 1

## 2015-06-23 MED ORDER — EPHEDRINE SULFATE 50 MG/ML IJ SOLN
INTRAMUSCULAR | Status: DC | PRN
  Administered 2015-06-23 (×2): 5 mg via INTRAVENOUS

## 2015-06-23 MED ORDER — IPRATROPIUM-ALBUTEROL 0.5-2.5 (3) MG/3ML IN SOLN: 3.0000 mL | Freq: Once | RESPIRATORY_TRACT | Status: DC

## 2015-06-23 MED ORDER — MORPHINE SULFATE 2 MG/ML IJ SOLN: 2.0000 mg | INTRAMUSCULAR | Status: DC | PRN

## 2015-06-23 MED ORDER — FENTANYL CITRATE (PF) 100 MCG/2ML IJ SOLN: 25.0000 ug | INTRAMUSCULAR | Status: DC | PRN

## 2015-06-23 MED ORDER — DOCUSATE SODIUM 100 MG PO CAPS
100.0000 mg | ORAL_CAPSULE | Freq: Every day | ORAL | Status: DC
  Administered 2015-06-24: 100 mg via ORAL
  Filled 2015-06-23: qty 1

## 2015-06-23 MED ORDER — POTASSIUM CHLORIDE CRYS ER 20 MEQ PO TBCR: 20.0000 meq | EXTENDED_RELEASE_TABLET | Freq: Every day | ORAL | Status: DC | PRN

## 2015-06-23 MED ORDER — LIDOCAINE HCL (PF) 1 % IJ SOLN
INTRAMUSCULAR | Status: AC
  Filled 2015-06-23: qty 30

## 2015-06-23 MED ORDER — ASPIRIN EC 81 MG PO TBEC
81.0000 mg | DELAYED_RELEASE_TABLET | Freq: Every day | ORAL | Status: DC
  Administered 2015-06-24: 81 mg via ORAL
  Filled 2015-06-23: qty 1

## 2015-06-23 MED ORDER — FENTANYL CITRATE (PF) 100 MCG/2ML IJ SOLN
INTRAMUSCULAR | Status: DC | PRN
  Administered 2015-06-23: 250 ug via INTRAVENOUS

## 2015-06-23 MED ORDER — EVICEL 2 ML EX KIT
PACK | CUTANEOUS | Status: DC | PRN
  Administered 2015-06-23: 2 mL

## 2015-06-23 MED ORDER — DEXAMETHASONE SODIUM PHOSPHATE 4 MG/ML IJ SOLN
INTRAMUSCULAR | Status: DC | PRN
  Administered 2015-06-23: 10 mg via INTRAVENOUS

## 2015-06-23 MED ORDER — LIDOCAINE HCL (PF) 4 % IJ SOLN
INTRAMUSCULAR | Status: DC | PRN
  Administered 2015-06-23: 200 mg

## 2015-06-23 MED ORDER — LABETALOL HCL 5 MG/ML IV SOLN
INTRAVENOUS | Status: AC
  Administered 2015-06-23: 10 mg via INTRAVENOUS
  Filled 2015-06-23: qty 4

## 2015-06-23 MED ORDER — LIDOCAINE HCL 1 % IJ SOLN
INTRAMUSCULAR | Status: DC | PRN
  Administered 2015-06-23: 10 mL via INTRADERMAL

## 2015-06-23 MED ORDER — ONDANSETRON HCL 4 MG/2ML IJ SOLN
INTRAMUSCULAR | Status: DC | PRN
  Administered 2015-06-23: 4 mg via INTRAVENOUS

## 2015-06-23 MED ORDER — ESMOLOL HCL-SODIUM CHLORIDE 2500 MG/250ML IV SOLN
25.0000 ug/kg/min | INTRAVENOUS | Status: DC
  Filled 2015-06-23: qty 250
  Filled 2015-06-23: qty 100

## 2015-06-23 MED ORDER — DOPAMINE-DEXTROSE 3.2-5 MG/ML-% IV SOLN: 3.0000 ug/kg/min | INTRAVENOUS | Status: DC

## 2015-06-23 MED ORDER — ROCURONIUM BROMIDE 100 MG/10ML IV SOLN
INTRAVENOUS | Status: DC | PRN
  Administered 2015-06-23: 35 mg via INTRAVENOUS

## 2015-06-23 MED ORDER — CEFAZOLIN SODIUM 1-5 GM-% IV SOLN
INTRAVENOUS | Status: AC
  Filled 2015-06-23: qty 50

## 2015-06-23 MED ORDER — MIDAZOLAM HCL 2 MG/2ML IJ SOLN
INTRAMUSCULAR | Status: DC | PRN
  Administered 2015-06-23: 1 mg via INTRAVENOUS

## 2015-06-23 MED ORDER — HEPARIN SODIUM (PORCINE) 1000 UNIT/ML IJ SOLN
INTRAMUSCULAR | Status: DC | PRN
  Administered 2015-06-23: 5000 [IU] via INTRAVENOUS

## 2015-06-23 MED ORDER — PROMETHAZINE HCL 25 MG/ML IJ SOLN
6.2500 mg | Freq: Once | INTRAMUSCULAR | Status: AC
  Administered 2015-06-23: 6.25 mg via INTRAVENOUS

## 2015-06-23 MED ORDER — SODIUM CHLORIDE 0.9 % IV SOLN: 500.0000 mL | Freq: Once | INTRAVENOUS | Status: AC | PRN

## 2015-06-23 MED ORDER — PROMETHAZINE HCL 25 MG RE SUPP
RECTAL | Status: AC
  Filled 2015-06-23: qty 1

## 2015-06-23 MED ORDER — PHENYLEPHRINE HCL 10 MG/ML IJ SOLN
INTRAMUSCULAR | Status: DC | PRN
  Administered 2015-06-23: 100 ug via INTRAVENOUS
  Administered 2015-06-23: 50 ug via INTRAVENOUS
  Administered 2015-06-23: 100 ug via INTRAVENOUS
  Administered 2015-06-23: 50 ug via INTRAVENOUS
  Administered 2015-06-23: 200 ug via INTRAVENOUS
  Administered 2015-06-23: 50 ug via INTRAVENOUS

## 2015-06-23 MED ORDER — SODIUM CHLORIDE 0.9 % IJ SOLN
INTRAMUSCULAR | Status: AC
Start: 2015-06-23 — End: 2015-06-24
  Filled 2015-06-23: qty 10

## 2015-06-23 MED ORDER — HEPARIN SODIUM (PORCINE) 1000 UNIT/ML IJ SOLN
INTRAMUSCULAR | Status: DC | PRN
  Administered 2015-06-23: 100 mL via INTRAMUSCULAR

## 2015-06-23 MED ORDER — HYDRALAZINE HCL 20 MG/ML IJ SOLN
10.0000 mg | Freq: Once | INTRAMUSCULAR | Status: AC
  Administered 2015-06-23: 10 mg via INTRAVENOUS

## 2015-06-23 MED ORDER — ACETAMINOPHEN 10 MG/ML IV SOLN
INTRAVENOUS | Status: AC
  Filled 2015-06-23: qty 100

## 2015-06-23 MED ORDER — NITROGLYCERIN IN D5W 200-5 MCG/ML-% IV SOLN
5.0000 ug/min | INTRAVENOUS | Status: DC
  Administered 2015-06-23: 10 ug/min via INTRAVENOUS

## 2015-06-23 MED ORDER — CEFAZOLIN SODIUM 1 G IJ SOLR
INTRAMUSCULAR | Status: AC
Start: 2015-06-23 — End: 2015-06-23
  Filled 2015-06-23: qty 10

## 2015-06-23 MED ORDER — ALUM & MAG HYDROXIDE-SIMETH 200-200-20 MG/5ML PO SUSP: 15.0000 mL | ORAL | Status: DC | PRN

## 2015-06-23 MED ORDER — GUAIFENESIN-DM 100-10 MG/5ML PO SYRP: 15.0000 mL | ORAL_SOLUTION | ORAL | Status: DC | PRN

## 2015-06-23 MED ORDER — ESMOLOL HCL-SODIUM CHLORIDE 2500 MG/250ML IV SOLN
INTRAVENOUS | Status: AC
  Administered 2015-06-23: 5 ug
  Filled 2015-06-23: qty 250

## 2015-06-23 MED ORDER — DEXTROSE 5 % IV SOLN
1.5000 g | Freq: Two times a day (BID) | INTRAVENOUS | Status: AC
  Administered 2015-06-23 – 2015-06-24 (×2): 1.5 g via INTRAVENOUS
  Filled 2015-06-23 (×2): qty 1.5

## 2015-06-23 MED ORDER — ONDANSETRON HCL 4 MG/2ML IJ SOLN: 4.0000 mg | Freq: Once | INTRAMUSCULAR | Status: DC

## 2015-06-23 MED ORDER — ONDANSETRON HCL 4 MG/2ML IJ SOLN
INTRAMUSCULAR | Status: AC
  Administered 2015-06-23: 4 mg
  Filled 2015-06-23: qty 2

## 2015-06-23 MED ORDER — OXYCODONE HCL 5 MG/5ML PO SOLN: 5.0000 mg | Freq: Once | ORAL | Status: AC | PRN

## 2015-06-23 MED ORDER — LACTATED RINGERS IV SOLN
INTRAVENOUS | Status: DC
  Administered 2015-06-23: 13:00:00 via INTRAVENOUS

## 2015-06-23 MED ORDER — ACETAMINOPHEN 10 MG/ML IV SOLN
INTRAVENOUS | Status: DC | PRN
  Administered 2015-06-23: 1000 mg via INTRAVENOUS

## 2015-06-23 MED ORDER — LABETALOL HCL 5 MG/ML IV SOLN
10.0000 mg | Freq: Once | INTRAVENOUS | Status: AC
  Administered 2015-06-23: 10 mg via INTRAVENOUS

## 2015-06-23 MED ORDER — CLOPIDOGREL BISULFATE 75 MG PO TABS
75.0000 mg | ORAL_TABLET | Freq: Every day | ORAL | Status: DC
  Administered 2015-06-24: 75 mg via ORAL
  Filled 2015-06-23: qty 1

## 2015-06-23 MED ORDER — ONDANSETRON HCL 4 MG/2ML IJ SOLN: 4.0000 mg | Freq: Four times a day (QID) | INTRAMUSCULAR | Status: DC | PRN

## 2015-06-23 MED ORDER — OXYCODONE-ACETAMINOPHEN 5-325 MG PO TABS
1.0000 | ORAL_TABLET | ORAL | Status: DC | PRN
  Administered 2015-06-24: 2 via ORAL
  Administered 2015-06-24: 1 via ORAL
  Filled 2015-06-23: qty 1
  Filled 2015-06-23: qty 2

## 2015-06-23 SURGICAL SUPPLY — 58 items
BAG DECANTER STRL (MISCELLANEOUS) ×3 IMPLANT
BLADE SURG 15 STRL LF DISP TIS (BLADE) ×1 IMPLANT
BLADE SURG 15 STRL SS (BLADE) ×2
BLADE SURG SZ11 CARB STEEL (BLADE) ×3 IMPLANT
BOOT SUTURE AID YELLOW STND (SUTURE) ×3 IMPLANT
BRUSH SCRUB 4% CHG (MISCELLANEOUS) ×3 IMPLANT
CANISTER SUCT 1200ML W/VALVE (MISCELLANEOUS) ×3 IMPLANT
CATH TRAY 16F METER LATEX (MISCELLANEOUS) ×3 IMPLANT
DRAPE INCISE IOBAN 66X45 STRL (DRAPES) ×3 IMPLANT
DRAPE PED LAPAROTOMY (DRAPES) ×3 IMPLANT
DRAPE SHEET LG 3/4 BI-LAMINATE (DRAPES) ×6 IMPLANT
DRSG TEGADERM 4X4.75 (GAUZE/BANDAGES/DRESSINGS) IMPLANT
DRSG TELFA 3X8 NADH (GAUZE/BANDAGES/DRESSINGS) IMPLANT
DURAPREP 26ML APPLICATOR (WOUND CARE) ×3 IMPLANT
ELECT CAUTERY BLADE 6.4 (BLADE) ×3 IMPLANT
EVICEL 2ML SEALANT HUMAN (Miscellaneous) ×3 IMPLANT
GLOVE BIO SURGEON STRL SZ7 (GLOVE) ×6 IMPLANT
GOWN STRL REUS W/ TWL LRG LVL3 (GOWN DISPOSABLE) ×1 IMPLANT
GOWN STRL REUS W/ TWL XL LVL3 (GOWN DISPOSABLE) ×2 IMPLANT
GOWN STRL REUS W/TWL LRG LVL3 (GOWN DISPOSABLE) ×2
GOWN STRL REUS W/TWL XL LVL3 (GOWN DISPOSABLE) ×4
HEMOSTAT SURGICEL 2X3 (HEMOSTASIS) ×3 IMPLANT
IV NS 250ML (IV SOLUTION) ×2
IV NS 250ML BAXH (IV SOLUTION) ×1 IMPLANT
KIT RM TURNOVER STRD PROC AR (KITS) ×3 IMPLANT
LABEL OR SOLS (LABEL) ×3 IMPLANT
LIQUID BAND (GAUZE/BANDAGES/DRESSINGS) ×3 IMPLANT
LOOP RED MAXI  1X406MM (MISCELLANEOUS) ×4
LOOP VESSEL MAXI 1X406 RED (MISCELLANEOUS) ×2 IMPLANT
LOOP VESSEL MINI 0.8X406 BLUE (MISCELLANEOUS) ×1 IMPLANT
LOOPS BLUE MINI 0.8X406MM (MISCELLANEOUS) ×2
NDL SAFETY 25GX1.5 (NEEDLE) ×3 IMPLANT
NEEDLE FILTER BLUNT 18X 1/2SAF (NEEDLE) ×2
NEEDLE FILTER BLUNT 18X1 1/2 (NEEDLE) ×1 IMPLANT
NS IRRIG 1000ML POUR BTL (IV SOLUTION) ×3 IMPLANT
PACK BASIN MAJOR ARMC (MISCELLANEOUS) ×3 IMPLANT
PAD GROUND ADULT SPLIT (MISCELLANEOUS) ×3 IMPLANT
PATCH CAROTID ECM VASC 1X10 (Prosthesis & Implant Heart) ×3 IMPLANT
PENCIL ELECTRO HAND CTR (MISCELLANEOUS) IMPLANT
SHUNT CAROTID PRUITT F3 T3103A (SHUNT) ×3 IMPLANT
SUT MNCRL 4-0 (SUTURE) ×2
SUT MNCRL 4-0 27XMFL (SUTURE) ×1
SUT PROLENE 6 0 BV (SUTURE) ×12 IMPLANT
SUT PROLENE BV 1 BLUE 7-0 30IN (SUTURE) ×9 IMPLANT
SUT SILK 2 0 (SUTURE) ×2
SUT SILK 2-0 18XBRD TIE 12 (SUTURE) ×1 IMPLANT
SUT SILK 3 0 (SUTURE) ×2
SUT SILK 3-0 18XBRD TIE 12 (SUTURE) ×1 IMPLANT
SUT SILK 4 0 (SUTURE) ×2
SUT SILK 4-0 18XBRD TIE 12 (SUTURE) ×1 IMPLANT
SUT VIC AB 3-0 SH 27 (SUTURE) ×6
SUT VIC AB 3-0 SH 27X BRD (SUTURE) ×3 IMPLANT
SUTURE MNCRL 4-0 27XMF (SUTURE) ×1 IMPLANT
SYR 20CC LL (SYRINGE) ×3 IMPLANT
SYRINGE 10CC LL (SYRINGE) ×6 IMPLANT
TOWEL OR 17X26 4PK STRL BLUE (TOWEL DISPOSABLE) IMPLANT
TUBING CONNECTING 10 (TUBING) IMPLANT
TUBING CONNECTING 10' (TUBING)

## 2015-06-23 NOTE — H&P (Signed)
[facility] History & Physical Update  The patient was interviewed and re-examined.  The patient's previous History and Physical has been reviewed and is unchanged.  There is no change in the plan of care. We plan to proceed with the scheduled procedure.  DEW,JASON, MD  06/23/2015, 12:03 PM

## 2015-06-23 NOTE — Transfer of Care (Signed)
Immediate Anesthesia Transfer of Care Note  Patient: Hailey Gibbs  Procedure(s) Performed: Procedure(s): ENDARTERECTOMY CAROTID (Left)  Patient Location: PACU  Anesthesia Type:General  Level of Consciousness: sedated  Airway & Oxygen Therapy: Patient Spontanous Breathing and Patient connected to face mask oxygen  Post-op Assessment: Report given to RN and Post -op Vital signs reviewed and stable  Post vital signs: Reviewed and stable  Last Vitals:  Filed Vitals:   06/23/15 1507  BP: 101/55  Pulse:   Temp: 37.7 C  Resp: 11    Complications: No apparent anesthesia complications

## 2015-06-23 NOTE — OR Nursing (Signed)
Patient was noticed to have swelling at surgery site marked 12x7 cm. Dr. Wyn Quakerew notified.

## 2015-06-23 NOTE — Anesthesia Procedure Notes (Addendum)
Procedure Name: Intubation Date/Time: 06/23/2015 12:48 PM Performed by: Rosaria FerriesPISCITELLO, Alzora Ha K Pre-anesthesia Checklist: Patient identified, Patient being monitored, Timeout performed, Emergency Drugs available and Suction available Patient Re-evaluated:Patient Re-evaluated prior to inductionOxygen Delivery Method: Circle system utilized Preoxygenation: Pre-oxygenation with 100% oxygen Intubation Type: IV induction Ventilation: Mask ventilation without difficulty Laryngoscope Size: Mac and 3 Grade View: Grade I Tube type: Oral Tube size: 7.0 mm Number of attempts: 1 Airway Equipment and Method: Stylet Placement Confirmation: ETT inserted through vocal cords under direct vision,  positive ETCO2 and breath sounds checked- equal and bilateral Secured at: 21 cm Tube secured with: Tape Dental Injury: Teeth and Oropharynx as per pre-operative assessment  Comments:    Arterial Line Placement:  Date: 06/23/2015 Time: 2:43 PM  A time-out was completed verifying correct patient, procedure, site, positioning, and special equipment if applicable.   Allen's test was performed to ensure adequate perfusion. The patient's right wrist was prepped and draped in sterile fashion.  A 20 G Arrow arterial line was introduced into the radial artery. The catheter was threaded over the guide wire and the needle was removed with appropriate pulsatile blood return. The catheter was then  then secured with a sterile Tegaderm dressing. Perfusion to the extremity distal to the point of catheter insertion was checked and found to be adequate. Attending was present for the entire procedure.  Estimated Blood Loss: minimal  The patient tolerated the procedure well and there were no immediate complications

## 2015-06-23 NOTE — Anesthesia Preprocedure Evaluation (Addendum)
Anesthesia Evaluation  Patient identified by MRN, date of birth, ID band Patient awake    Reviewed: Allergy & Precautions, H&P , NPO status , Patient's Chart, lab work & pertinent test results, reviewed documented beta blocker date and time   Airway Mallampati: II  TM Distance: >3 FB Neck ROM: full    Dental  (+) Upper Dentures, Lower Dentures   Pulmonary shortness of breath, COPD COPD inhaler, Current Smoker,  breath sounds clear to auscultation  Pulmonary exam normal       Cardiovascular Exercise Tolerance: Good hypertension, + Peripheral Vascular Disease - Past MI Normal cardiovascular examRhythm:regular Rate:Normal     Neuro/Psych PSYCHIATRIC DISORDERS Anxiety Depression TIAnegative psych ROS   GI/Hepatic Neg liver ROS, GERD-  Medicated and Controlled,  Endo/Other  negative endocrine ROS  Renal/GU negative Renal ROS  negative genitourinary   Musculoskeletal   Abdominal   Peds  Hematology negative hematology ROS (+)   Anesthesia Other Findings Past Medical History:   Carotid artery occlusion                                     Hypertension                                                 COPD (chronic obstructive pulmonary disease)                 Seasonal allergies                                           Hyperlipidemia                                               Shortness of breath dyspnea                                  Depression                                                   Anxiety                                                      GERD (gastroesophageal reflux disease)                       Reproductive/Obstetrics negative OB ROS                            Anesthesia Physical Anesthesia Plan  ASA: III  Anesthesia Plan: General ETT   Post-op Pain Management:    Induction:   Airway Management Planned:   Additional Equipment:  Intra-op Plan:   Post-operative  Plan:   Informed Consent: I have reviewed the patients History and Physical, chart, labs and discussed the procedure including the risks, benefits and alternatives for the proposed anesthesia with the patient or authorized representative who has indicated his/her understanding and acceptance.   Dental Advisory Given  Plan Discussed with: Anesthesiologist, CRNA and Surgeon  Anesthesia Plan Comments:         Anesthesia Quick Evaluation

## 2015-06-23 NOTE — Progress Notes (Signed)
Patient resting in recovery room. Developed neck swelling with hypertension.  Had BP in the 175-180/90 range and has moderate hematoma. Have gotten BP under control with esmolol and NTG and now about 120/60. Neck swelling has not progressed in about 30 minutes now, and seems to be slightly better No tracheal deviation.  Airway does not appear threatened at all at this point Does not appear to be actively bleeding or expanding.   Will keep BP down.  Likely had significant oozing with severe HTN earlier.   Appears this will keep neck from swelling worse Monitor closely in PACU and in CCU tonight. Keep head elevated.  Can use Ice pack on neck as needed.

## 2015-06-23 NOTE — Op Note (Signed)
[facility]   OPERATIVE NOTE  PROCEDURE:   1.  Left carotid endarterectomy with CorMatrix arterial patch reconstruction  PRE-OPERATIVE DIAGNOSIS: 1.  left carotid stenosis  POST-OPERATIVE DIAGNOSIS: same as above   SURGEON: Festus Barren, MD  ASSISTANT(S): Raul Del, PA-C  ANESTHESIA: general  ESTIMATED BLOOD LOSS: 50 cc  FINDING(S): 1.  Left carotid plaque, highly calcified.  SPECIMEN(S):  Carotid plaque (sent to Pathology)  INDICATIONS:   Hailey Gibbs is a 69 y.o. female who presents with left carotid stenosis of greater than 70 %.  I discussed with the patient the risks, benefits, and alternatives to carotid endarterectomy.  I discussed the differences between carotid stenting and carotid endarterectomy. I discussed the procedural details of carotid endarterectomy with the patient.  The patient is aware that the risks of carotid endarterectomy include but are not limited to: bleeding, infection, stroke, myocardial infarction, death, cranial nerve injuries both temporary and permanent, neck hematoma, possible airway compromise, labile blood pressure post-operatively, cerebral hyperperfusion syndrome, and possible need for additional interventions in the future. The patient is aware of the risks and agrees to proceed forward with the procedure.  DESCRIPTION: After full informed written consent was obtained from the patient, the patient was brought back to the operating room and placed supine upon the operating table.  Prior to induction, the patient received IV antibiotics.  After obtaining adequate anesthesia, the patient was placed into a modified beach chair position with a shoulder roll in place and the patient's neck slightly hyperextended and rotated away from the surgical site.  The patient was prepped in the standard fashion for a carotid endarterectomy.  I made an incision anterior to the sternocleidomastoid muscle and dissected down through the  subcutaneous tissue.  The platysmas was opened with electrocautery.  Then I dissected down to the internal jugular vein and facial vein.  The facial vein is ligated and divided between 2-0 silk ties.  This was dissected posteriorly until I obtained visualization of the common carotid artery.  This was dissected out and then a vessel loop was placed around the common carotid artery.  I then dissected in a periadventitial fashion along the common carotid artery up to the bifurcation.  I then identified the external carotid artery and the superior thyroid artery.  I placed a vessel loop around the superior thyroid artery, and I also dissected out the external carotid artery and placed a vessel loop around it. In the process of this dissection, the hypoglossal nerve was identified and protected from harm.  I then dissected out the internal carotid artery until I identified an area in the internal carotid artery clearly above the stenosis.  I dissected slightly distal to this area, and placed a vessel loop around the artery.  At this point, we gave the patient 5000 units of intravenous heparin.  After this was allowed to circulate for several minutes, I pulled up control on the vessel loops to clamp the internal carotid artery, external carotid artery, superior thyroid artery, and then the common carotid artery.  I then made an arteriotomy in the common carotid artery with a 11 blade, and extended the arteriotomy with a Potts scissor down into the common carotid artery, then I carried the arteriotomy through the bifurcation into the internal carotid artery until I reached an area that was not diseased.  At this point, I took the [place] shunt that previously been prepared and I inserted it into the internal carotid artery first, and  then into the common carotid artery taking care to flush and de-air prior to release of control. At this point, I started the endarterectomy in the common carotid artery with a  [place]field elevator and carried this dissection down into the common carotid artery circumferentially.  Then I transected the plaque at a segment where it was adherent and transected the plaque with Potts scissors.  I then carried this dissection up into the external carotid artery.  The plaque was extracted by unclamping the external carotid artery and performing an eversion endarterectomy.  The dissection was then carried into the internal carotid artery where a nice feathered end point was created with gentle traction.  I passed the plaque off the field as a specimen. At this point I removed all loose flecks and remaining disease possible.  At this point, I was satisfied that the minimal remaining disease was densely adherent to the wall and wall integrity was intact. The distal endpoint was tacked down with three  7-0 Prolene sutures.  I then fashioned a CorMatrix arterial patch for the artery and sewed it in place with two running stitch of 6-0 Prolene.  I started at the distal endpoint and ran one half the length of the arteriotomy.  I then cut and beveled the patch to an appropriate length to match the arteriotomy.  I started the second 6-0 Prolene at the proximal end point.  The medial suture line was completed and the lateral suture line was run approximately one quarter the length of the arteriotomy.  Prior to completing this patch angioplasty, I removed the shunt first from the internal carotid artery, from which there was excellent backbleeding, and clamped it.  Then I removed the shunt from the common carotid artery, from which there was excellent antegrade bleeding, and then clamped it.  At this point, I allowed the external carotid artery to backbleed, which was excellent.  Then I instilled heparinized saline in this patched artery and then completed the patch angioplasty in the usual fashion.  First, I released the clamp on the external carotid artery, then I released it on the common carotid artery.   After waiting a few seconds, I then released it on the internal carotid artery. Several minutes of pressure were held and three 6-0 Prolene patch sutures were used as need for hemostasis.  At this point, I placed Surgicel and Evicel topical hemostatic agents.  There was no more active bleeding in the surgical site.  The sternocleidomastoid space was closed with three interrupted 3-0 Vicryl sutures. I then reapproximated the platysma muscle with a running stitch of 3-0 Vicryl.  The skin was then closed with a running subcuticular 4-0 Monocryl.  The skin was then cleaned, dried and Dermabond was used to reinforce the skin closure.  The patient awakened and was taken to the recovery room in stable condition, following commands and moving all four extremities without any apparent deficits.    COMPLICATIONS: none  CONDITION: stable  Hailey Gibbs  06/23/2015, 2:40 PM

## 2015-06-23 NOTE — OR Nursing (Signed)
Order in computer for esmolol is not correct dosing according to what pharmacy stocks. Dr. Wyn Quakerew notified and stated 2500mg  in 250 ml is okay.  Order is for 2000 mg in 100 ml.

## 2015-06-24 LAB — BASIC METABOLIC PANEL
ANION GAP: 8 (ref 5–15)
BUN: 12 mg/dL (ref 6–20)
CALCIUM: 8.9 mg/dL (ref 8.9–10.3)
CO2: 27 mmol/L (ref 22–32)
Chloride: 104 mmol/L (ref 101–111)
Creatinine, Ser: 0.62 mg/dL (ref 0.44–1.00)
GFR calc Af Amer: >60 mL/min (ref >60)
GLUCOSE: 125 mg/dL — AB (ref 65–99)
Potassium: 4.5 mmol/L (ref 3.5–5.1)
SODIUM: 139 mmol/L (ref 135–145)

## 2015-06-24 LAB — GLUCOSE, CAPILLARY: GLUCOSE-CAPILLARY: 109 mg/dL — AB (ref 65–99)

## 2015-06-24 LAB — CBC
HCT: 36.6 % (ref 35.0–47.0)
Hemoglobin: 12 g/dL (ref 12.0–16.0)
MCH: 32.1 pg (ref 26.0–34.0)
MCHC: 32.6 g/dL (ref 32.0–36.0)
MCV: 98.4 fL (ref 80.0–100.0)
PLATELETS: 273 10*3/uL (ref 150–440)
RBC: 3.72 MIL/uL — ABNORMAL LOW (ref 3.80–5.20)
RDW: 13.8 % (ref 11.5–14.5)
WBC: 11.7 10*3/uL — AB (ref 3.6–11.0)

## 2015-06-24 MED ORDER — ALBUTEROL SULFATE HFA 108 (90 BASE) MCG/ACT IN AERS: 1.0000 | INHALATION_SPRAY | RESPIRATORY_TRACT | Status: DC | PRN

## 2015-06-24 MED ORDER — HYDROCODONE-ACETAMINOPHEN 5-325 MG PO TABS: 1.0000 | ORAL_TABLET | Freq: Four times a day (QID) | ORAL | Status: DC | PRN

## 2015-06-24 MED ORDER — BUDESONIDE-FORMOTEROL FUMARATE 160-4.5 MCG/ACT IN AERO
2.0000 | INHALATION_SPRAY | Freq: Two times a day (BID) | RESPIRATORY_TRACT | Status: DC
  Administered 2015-06-24: 2 via RESPIRATORY_TRACT
  Filled 2015-06-24: qty 6

## 2015-06-24 MED ORDER — CLOPIDOGREL BISULFATE 75 MG PO TABS: 75.0000 mg | ORAL_TABLET | Freq: Every day | ORAL | Status: DC

## 2015-06-24 MED ORDER — ALBUTEROL SULFATE HFA 108 (90 BASE) MCG/ACT IN AERS
1.0000 | INHALATION_SPRAY | RESPIRATORY_TRACT | Status: DC | PRN
  Filled 2015-06-24: qty 6.7

## 2015-06-24 MED ORDER — ALBUTEROL SULFATE (2.5 MG/3ML) 0.083% IN NEBU: 2.5000 mg | INHALATION_SOLUTION | RESPIRATORY_TRACT | Status: DC | PRN

## 2015-06-24 MED ORDER — TIOTROPIUM BROMIDE MONOHYDRATE 18 MCG IN CAPS
18.0000 ug | ORAL_CAPSULE | Freq: Every day | RESPIRATORY_TRACT | Status: DC
  Filled 2015-06-24: qty 5

## 2015-06-24 NOTE — Progress Notes (Signed)
SATURATION QUALIFICATIONS: (This note is used to comply with regulatory documentation for home oxygen)  Patient Saturations on Room Air at Rest = 79%  Please briefly explain why patient needs home oxygen: COPD

## 2015-06-24 NOTE — Anesthesia Postprocedure Evaluation (Signed)
  Anesthesia Post-op Note  Patient: Hailey Gibbs  Procedure(s) Performed: Procedure(s): ENDARTERECTOMY CAROTID (Left)  Anesthesia type:General ETT  Patient location: ccu 2  Post pain: Pain level controlled  Post assessment: Post-op Vital signs reviewed, Patient's Cardiovascular Status Stable, Respiratory Function Stable, Patent Airway and No signs of Nausea or vomiting  Post vital signs: Reviewed and stable  Last Vitals:  Filed Vitals:   06/24/15 0600  BP: 111/50  Pulse: 72  Temp:   Resp: 14    Level of consciousness: awake, alert  and patient cooperative  Complications: No apparent anesthesia complications

## 2015-06-24 NOTE — Anesthesia Post-op Follow-up Note (Signed)
  Anesthesia Pain Follow-up Note  Patient: Hailey Gibbs  Day #: 1  Date of Follow-up: 06/24/2015 Time: 7:23 AM  Last Vitals:  Filed Vitals:   06/24/15 0600  BP: 111/50  Pulse: 72  Temp:   Resp: 14    Level of Consciousness: alert  Pain: none   Side Effects:None  Catheter Site Exam: none  Plan: D/C from anesthesia care  Chong SicilianLopez,  Chapin Arduini

## 2015-06-24 NOTE — Progress Notes (Signed)
Patient unable to tolerate being on room air without O2 saturation dropping into the 80s, lowest sat on room air observed was 77%, patient sat improved when placed on 1L per [place] currently on at 94%.  RN spoke to Dr Wyn Quakerew who gave order for patient to go home with home O2, no home health needed per Dr Wyn Quakerew.  Patient currently resting in no apparent distress.  See

## 2015-06-24 NOTE — Progress Notes (Signed)
RN notified Dr Wyn Quakerew of patient requesting to restart her inhalers from home, Dr give order to restart meds from home and diet.  Inhalers sent to pharmacy to be verified.

## 2015-06-24 NOTE — Progress Notes (Signed)
Patient walked around unit x2 then proceed to have feeling of dizziness, BP check is 106/44 with map at 59, O2 sats at 86%.  RN notified Dr Wyn Quakerew, ask if MD wants to give patient a 250 bolus, MD states "no, i actually prefer for her systolic to be in the lows 100s or even 90s, I am not too concern about her sat being 86%, she's a smoker and probably lives around there, she may eventually need some oxygen at home as well, tell her to wait a few more hours to see how she feels before we let her go home.

## 2015-06-24 NOTE — Discharge Instructions (Signed)
Call or contact our office with fever >101, wound redness or drainage, severe pain, neurologic changes, or other issue No driving or heavy lifting for one week

## 2015-06-24 NOTE — Progress Notes (Signed)
Patient discharge home with home 02.  Patient and son given discharge instructions and prescriptions without further questions.  Belonging returned to patient. IVs removed catheter intact.  Patient discharged via wheel chair accompanied by Nursing and family.

## 2015-06-24 NOTE — Anesthesia Postprocedure Evaluation (Deleted)
  Anesthesia Post-op Note  Patient: Hailey Gibbs  Procedure(s) Performed: Procedure(s): ENDARTERECTOMY CAROTID (Left)  Anesthesia type:General ETT  Patient location: PACU  Post pain: Pain level controlled  Post assessment: Post-op Vital signs reviewed, Patient's Cardiovascular Status Stable, Respiratory Function Stable, Patent Airway and No signs of Nausea or vomiting  Post vital signs: Reviewed and stable  Last Vitals:  Filed Vitals:   06/24/15 1200  BP:   Pulse: 84  Temp:   Resp: 21    Level of consciousness: awake, alert  and patient cooperative  Complications: No apparent anesthesia complications

## 2015-06-24 NOTE — Discharge Summary (Signed)
[facility]    Discharge Summary    Patient ID:  Hailey Gibbs MRN: [id number] DOB/AGE: 1946/04/02 69 y.o.  Admit date: 06/23/2015 Discharge date: 06/24/2015 Date of Surgery: 06/23/2015 Surgeon: Surgeon(s): Annice Needy, MD  Admission Diagnosis: CAROTID ARTERY STENOSIS  Discharge Diagnoses:  CAROTID ARTERY STENOSIS  Secondary Diagnoses: Past Medical History  Diagnosis Date  . Carotid artery occlusion   . Hypertension   . COPD (chronic obstructive pulmonary disease)   . Seasonal allergies   . Hyperlipidemia   . Shortness of breath dyspnea   . Depression   . Anxiety   . GERD (gastroesophageal reflux disease)     Procedure(s): ENDARTERECTOMY CAROTID  Discharged Condition: good  HPI:  Patient with high grade left ICA stenosis.  Brought in for elective CEA for stroke risk reduction.  Hospital Course:  Hailey Gibbs is a 69 y.o. female is S/P left Procedure(s): ENDARTERECTOMY CAROTID Extubated: in OR Physical exam: AF/VSS.  Neck swelling mild to moderate and stable to improved from last night.  No airway issues or swallowing problems Neuro exam normal. Post-op wounds healing well.  Mild to moderate bruising. Pt. Ambulating, voiding and taking PO diet without difficulty. Pt pain controlled with PO pain meds. Labs as below Complications: none  Consults:     Significant Diagnostic Studies: CBC Lab Results  Component Value Date   WBC 11.7* 06/24/2015   HGB 12.0 06/24/2015   HCT 36.6 06/24/2015   MCV 98.4 06/24/2015   PLT 273 06/24/2015    BMET    Component Value Date/Time   NA 139 06/24/2015 0401   NA 135* 02/26/2014 0412   K 4.5 06/24/2015 0401   K 3.7 02/26/2014 0412   CL 104 06/24/2015 0401   CL 104 02/26/2014 0412   CO2 27 06/24/2015 0401   CO2 25 02/26/2014 0412   GLUCOSE 125* 06/24/2015 0401   GLUCOSE 125* 02/26/2014 0412   BUN 12 06/24/2015 0401   BUN 19* 02/26/2014 0412   CREATININE 0.62 06/24/2015 0401    CREATININE 0.55* 02/26/2014 0412   CALCIUM 8.9 06/24/2015 0401   CALCIUM 9.2 02/26/2014 0412   GFRNONAA >60 06/24/2015 0401   GFRNONAA >60 02/26/2014 0412   GFRAA >60 06/24/2015 0401   GFRAA >60 02/26/2014 0412   COAG Lab Results  Component Value Date   INR 0.87 06/16/2015     Disposition:  Discharge to :home    Medication List    TAKE these medications        acetaminophen 500 MG tablet  Commonly known as:  TYLENOL  Take 1,000 mg by mouth every 6 (six) hours as needed for mild pain or headache.     albuterol 108 (90 BASE) MCG/ACT inhaler  Commonly known as:  PROVENTIL HFA;VENTOLIN HFA  Inhale 1-2 puffs into the lungs 4 (four) times daily as needed for wheezing or shortness of breath.     aspirin 81 MG chewable tablet  Chew 81 mg by mouth at bedtime.     atorvastatin 10 MG tablet  Commonly known as:  LIPITOR  Take 10 mg by mouth at bedtime.     budesonide-formoterol 160-4.5 MCG/ACT inhaler  Commonly known as:  SYMBICORT  Inhale 2 puffs into the lungs 2 (two) times daily.     cetirizine 10 MG tablet  Commonly known as:  ZYRTEC  Take 10 mg by mouth at bedtime.     clopidogrel 75 MG tablet  Commonly known as:  PLAVIX  Take 1  tablet (75 mg total) by mouth daily with breakfast.     HYDROcodone-acetaminophen 5-325 MG per tablet  Commonly known as:  NORCO  Take 1 tablet by mouth every 6 (six) hours as needed for moderate pain.     multivitamin tablet  Take 1 tablet by mouth at bedtime.     ranitidine 150 MG tablet  Commonly known as:  ZANTAC  Take 150 mg by mouth daily.     sertraline 100 MG tablet  Commonly known as:  ZOLOFT  Take 100 mg by mouth at bedtime.     tiotropium 18 MCG inhalation capsule  Commonly known as:  SPIRIVA  Place 18 mcg into inhaler and inhale daily.       Verbal and written Discharge instructions given to the patient. Wound care per Discharge AVS     Follow-up Information    Follow up with DEW,JASON, MD In 3 weeks.    Specialties:  Vascular Surgery, Radiology, Interventional Cardiology   Why:  with me or PA with carotid duplex   Contact information:   [address] [place] [address] [phone]       Signed: Festus BarrenEW,JASON, MD  06/24/2015, 8:40 AM

## 2015-06-24 NOTE — Care Management (Signed)
Home O2 to be delivered by Will with [facility] per Dr. Festus BarrenJason Dew order. O2 portable tank (tank will last ~4 hours- patient aware) will be delivered to this room prior to discharge and patient will be given [facility] telephone number to call when she gets home to have it set up in the home. PCP- Dr. Aline Augustrissmon. Patient advised and agrees. No further RNCM needs. Case closed.

## 2015-06-27 LAB — SURGICAL PATHOLOGY

## 2015-06-29 ENCOUNTER — Telehealth: Payer: Self-pay | Admitting: Family Medicine

## 2015-06-29 NOTE — Telephone Encounter (Signed)
Pt. Called to let Dr know that she just got out of hospital on Friday July 15,2016 discharge home with oxygen. Pt states she will  call back to make a follow up  Appointment. CB# [phone]. CC

## 2015-07-01 NOTE — Telephone Encounter (Signed)
See below

## 2015-07-19 DIAGNOSIS — J449 Chronic obstructive pulmonary disease, unspecified: Secondary | ICD-10-CM | POA: Diagnosis not present

## 2015-07-19 DIAGNOSIS — I6523 Occlusion and stenosis of bilateral carotid arteries: Secondary | ICD-10-CM | POA: Diagnosis not present

## 2015-07-19 DIAGNOSIS — I6522 Occlusion and stenosis of left carotid artery: Secondary | ICD-10-CM | POA: Diagnosis not present

## 2015-07-19 DIAGNOSIS — I6529 Occlusion and stenosis of unspecified carotid artery: Secondary | ICD-10-CM | POA: Diagnosis not present

## 2015-07-25 DIAGNOSIS — J449 Chronic obstructive pulmonary disease, unspecified: Secondary | ICD-10-CM | POA: Diagnosis not present

## 2015-08-08 ENCOUNTER — Other Ambulatory Visit: Payer: Self-pay | Admitting: Family Medicine

## 2015-08-19 ENCOUNTER — Encounter: Payer: Self-pay | Admitting: Vascular Surgery

## 2015-08-25 DIAGNOSIS — J449 Chronic obstructive pulmonary disease, unspecified: Secondary | ICD-10-CM | POA: Diagnosis not present

## 2015-08-29 DIAGNOSIS — F32A Depression, unspecified: Secondary | ICD-10-CM | POA: Insufficient documentation

## 2015-08-29 DIAGNOSIS — I10 Essential (primary) hypertension: Secondary | ICD-10-CM

## 2015-08-29 DIAGNOSIS — F419 Anxiety disorder, unspecified: Secondary | ICD-10-CM

## 2015-08-29 DIAGNOSIS — R0989 Other specified symptoms and signs involving the circulatory and respiratory systems: Secondary | ICD-10-CM | POA: Insufficient documentation

## 2015-08-29 DIAGNOSIS — J441 Chronic obstructive pulmonary disease with (acute) exacerbation: Secondary | ICD-10-CM | POA: Insufficient documentation

## 2015-08-29 DIAGNOSIS — F329 Major depressive disorder, single episode, unspecified: Secondary | ICD-10-CM | POA: Insufficient documentation

## 2015-08-29 HISTORY — DX: Essential (primary) hypertension: I10

## 2015-08-29 HISTORY — DX: Chronic obstructive pulmonary disease with (acute) exacerbation: J44.1

## 2015-08-29 HISTORY — DX: Other specified symptoms and signs involving the circulatory and respiratory systems: R09.89

## 2015-08-30 ENCOUNTER — Ambulatory Visit (INDEPENDENT_AMBULATORY_CARE_PROVIDER_SITE_OTHER): Payer: Commercial Managed Care - HMO | Admitting: Family Medicine

## 2015-08-30 ENCOUNTER — Ambulatory Visit
Admission: RE | Admit: 2015-08-30 | Discharge: 2015-08-30 | Disposition: A | Discharge status: Home / Self Care | Payer: Commercial Managed Care - HMO | Source: Ambulatory Visit | Attending: Family Medicine | Admitting: Family Medicine

## 2015-08-30 ENCOUNTER — Encounter: Payer: Self-pay | Admitting: Family Medicine

## 2015-08-30 VITALS — BP 110/86 | HR 88 | Resp 20 | Wt 148.0 lb

## 2015-08-30 DIAGNOSIS — R0989 Other specified symptoms and signs involving the circulatory and respiratory systems: Secondary | ICD-10-CM

## 2015-08-30 DIAGNOSIS — R05 Cough: Secondary | ICD-10-CM | POA: Diagnosis not present

## 2015-08-30 DIAGNOSIS — Z720 Tobacco use: Secondary | ICD-10-CM

## 2015-08-30 DIAGNOSIS — I10 Essential (primary) hypertension: Secondary | ICD-10-CM | POA: Diagnosis not present

## 2015-08-30 DIAGNOSIS — I6521 Occlusion and stenosis of right carotid artery: Secondary | ICD-10-CM | POA: Diagnosis not present

## 2015-08-30 DIAGNOSIS — J441 Chronic obstructive pulmonary disease with (acute) exacerbation: Secondary | ICD-10-CM | POA: Diagnosis not present

## 2015-08-30 MED ORDER — AZITHROMYCIN 250 MG PO TABS: ORAL_TABLET | ORAL | Status: DC

## 2015-08-30 NOTE — Progress Notes (Signed)
Subjective:    Patient ID: Hailey Gibbs, female    DOB: [birth year], 69 y.o.   MRN: [id number] Chief Complaint  Patient presents with  . Cough  . Hypertension  . Hypotension  . Shortness of Breath   HPI  This 69 year old female developed a cough last week and very concerned about possible pneumonia with history of COPD/emphysema with continuation of smoking (been using Nicotine patches but smoking 1/2-3/4 ppd).Has noticed drop of BP on the Lisinopril 5 mg qd since left carotid endarterectomy 06-23-15. Has stopped taking Lisinopril because BP dropped below 100/60. Denies headaches, dizziness, sore throat or fever. Some greenish sputum production occasionally.  Past Medical History  Diagnosis Date  . Carotid artery occlusion   . Hypertension   . COPD (chronic obstructive pulmonary disease)   . Seasonal allergies   . Hyperlipidemia   . Shortness of breath dyspnea   . Depression   . Anxiety   . GERD (gastroesophageal reflux disease)    Patient Active Problem List   Diagnosis Date Noted  . Abnormal chest sounds 08/29/2015  . Obstructive chronic bronchitis with exacerbation 08/29/2015  . Anxiety and depression 08/29/2015  . BP (high blood pressure) 08/29/2015  . Carotid stenosis 06/23/2015  . Pre-operative cardiovascular examination 05/30/2015  . Tobacco use 05/30/2015  . Hyperlipidemia    Past Surgical History  Procedure Laterality Date  . Vaginal hysterectomy    . Tubal ligation    . Endarterectomy Left 06/23/2015    Procedure: ENDARTERECTOMY CAROTID;  Surgeon: Annice Needy, MD;  Location: [facility] ORS;  Service: Vascular;  Laterality: Left;   Family History  Problem Relation Age of Onset  . Heart disease Father 71    CABG   . Hyperlipidemia Father   . Pneumonia Father   . Dementia Mother   . COPD Mother   . Healthy Sister   . Parkinson's disease Brother    Social History  Substance Use Topics  . Smoking status: Current Every Day Smoker -- 0.75 packs/day for 45  years    Types: Cigarettes  . Smokeless tobacco: Never Used     Comment: pt uses nictine patches also  . Alcohol Use: No   Allergies  Allergen Reactions  . Codeine Anxiety        Current Outpatient Prescriptions on File Prior to Visit  Medication Sig Dispense Refill  . acetaminophen (TYLENOL) 500 MG tablet Take 1,000 mg by mouth every 6 (six) hours as needed for mild pain or headache.     . albuterol (PROVENTIL HFA;VENTOLIN HFA) 108 (90 BASE) MCG/ACT inhaler Inhale 1-2 puffs into the lungs 4 (four) times daily as needed for wheezing or shortness of breath.    Marland Kitchen aspirin 81 MG chewable tablet Chew 81 mg by mouth at bedtime.     Marland Kitchen atorvastatin (LIPITOR) 10 MG tablet Take 10 mg by mouth at bedtime.    . budesonide-formoterol (SYMBICORT) 160-4.5 MCG/ACT inhaler Inhale 2 puffs into the lungs 2 (two) times daily.    . cetirizine (ZYRTEC) 10 MG tablet Take 10 mg by mouth at bedtime.     . clopidogrel (PLAVIX) 75 MG tablet Take 1 tablet (75 mg total) by mouth daily with breakfast. 30 tablet 3  . Multiple Vitamin (MULTIVITAMIN) tablet Take 1 tablet by mouth at bedtime.     . ranitidine (ZANTAC) 150 MG tablet Take 150 mg by mouth daily.    . sertraline (ZOLOFT) 100 MG tablet 1 (ONE) TABLET, ORAL, AT BEDTIME DAILY BY  MOUTH 30 tablet 3  . tiotropium (SPIRIVA) 18 MCG inhalation capsule Place 18 mcg into inhaler and inhale daily.    Marland Kitchen lisinopril (PRINIVIL,ZESTRIL) 5 MG tablet Take by mouth.     No current facility-administered medications on file prior to visit.   Review of Systems  Constitutional: Negative.   HENT: Negative.   Eyes: Negative.   Respiratory: Positive for cough and shortness of breath.   Cardiovascular: Negative.   Gastrointestinal: Negative.   Neurological: Negative.   Hematological: Negative.       BP 110/86 mmHg  Pulse 88  Resp 20  Wt 148 lb (67.132 kg)  SpO2 93%   Objective:   Physical Exam  Constitutional: She is oriented to person, place, and time. She appears  well-developed and well-nourished.  HENT:  Head: Normocephalic and atraumatic.  Right Ear: External ear normal.  Left Ear: External ear normal.  Nose: Nose normal.  Mouth/Throat: Oropharynx is clear and moist.  Eyes: Conjunctivae and EOM are normal.  Neck: Normal range of motion. Neck supple. Carotid bruit is present.    Cardiovascular: Normal rate, regular rhythm and normal heart sounds.   Pulmonary/Chest: Effort normal. She has rales.  Rales in left posterior base. No pleurisy. Otherwise distant breath sounds on 2LPM  24 hours a day.  Abdominal: Soft. Bowel sounds are normal.  Musculoskeletal: Normal range of motion.  Neurological: She is alert and oriented to person, place, and time.  Psychiatric: She has a normal mood and affect. Her behavior is normal. Thought content normal.      Assessment & Plan:  1. Rales Onset with cough over the past week. Denies fever but having some occasional sputum production. No increase in dyspnea. States pulse oximetry at home ranging form 79% (without oxygen) to 97% (with oxygen). Will start Azithromycin (Z-pak) and check CBC with CXR. Follow up pending reports. - DG Chest 2 View - CBC with Differential/Platelet  2. Carotid stenosis, right Still has bruit present on the right. Left carotic endarterectomy was done 06-23-15 by Dr. Wyn Quaker. States she feels well and has not had any headaches since surgery. Denies dizziness or focal neurologic deficits. Dr. Wyn Quaker has her on Pradaxa and Lipitor for the next 3 months. Has follow up appointment with him in Feb. 2017.  3. Obstructive chronic bronchitis with exacerbation Stable on the Spiriva, Symbicort 160/4.5 mcg and oxygen at 2 LPM continuously. Pulse oximetry 93% on oxygen at rest. Drops to 79% at home without oxygen and usually as high as 97% on the oxygen at home. Recent congestion, cough and rales has lowered on oxygen levels.  4. Essential hypertension Stable low BP at home and in the office. Stopped the  Lisinopril recently due to BP below the 100/60 range. May hold the Lisinopril and continue to check BP at home. Call report of progress in 2 weeks to see if needing to restart the Lisinopril. Encouraged to stop all smoking.  5. Tobacco use Has lowered cigarette use to 1/2 - 3/4 ppd now. Using the nicotine patches to try to quit all smoking. Recheck in 3 months. Recommend stopping all smoking while using the nicotine patch. States she is considering stopping the patch until she stops smoking. (Doubt she will quit).

## 2015-08-31 ENCOUNTER — Telehealth: Payer: Self-pay

## 2015-08-31 LAB — CBC WITH DIFFERENTIAL/PLATELET
Basophils Absolute: 0 10*3/uL (ref 0.0–0.2)
Basos: 1 %
EOS (ABSOLUTE): 0.2 10*3/uL (ref 0.0–0.4)
EOS: 2 %
Hematocrit: 35 % (ref 34.0–46.6)
Hemoglobin: 11.3 g/dL (ref 11.1–15.9)
Immature Grans (Abs): 0 10*3/uL (ref 0.0–0.1)
Immature Granulocytes: 0 %
Lymphocytes Absolute: 3 10*3/uL (ref 0.7–3.1)
Lymphs: 35 %
MCH: 31 pg (ref 26.6–33.0)
MCHC: 32.3 g/dL (ref 31.5–35.7)
MCV: 96 fL (ref 79–97)
MONOS ABS: 0.8 10*3/uL (ref 0.1–0.9)
Monocytes: 10 %
NEUTROS PCT: 52 %
Neutrophils Absolute: 4.5 10*3/uL (ref 1.4–7.0)
PLATELETS: 408 10*3/uL — AB (ref 150–379)
RBC: 3.65 x10E6/uL — AB (ref 3.77–5.28)
RDW: 13.3 % (ref 12.3–15.4)
WBC: 8.5 10*3/uL (ref 3.4–10.8)

## 2015-08-31 NOTE — Telephone Encounter (Signed)
-----   Message from Tamsen Roers, [place] sent at 08/30/2015  6:34 PM EDT ----- No sign of pneumonia. Probably an exacerbation of COPD. Awaiting lab reports. Proceed with antibiotic.

## 2015-08-31 NOTE — Telephone Encounter (Signed)
Patient advised as directed below. Patient verbalized understanding.  

## 2015-09-06 ENCOUNTER — Telehealth: Payer: Self-pay

## 2015-09-06 NOTE — Telephone Encounter (Signed)
-----   Message from Tamsen Roers, [place] sent at 09/02/2015  5:36 PM EDT ----- WBC counts back to normal compared to 2 months ago. No sign of pneumonia on chest x-ray. Finish the antibiotics and recheck prn.

## 2015-09-06 NOTE — Telephone Encounter (Signed)
LMTCB

## 2015-09-07 NOTE — Telephone Encounter (Signed)
Patient advised as directed below. Patient verbalized understanding and agrees with treatment plan. 

## 2015-09-24 DIAGNOSIS — J449 Chronic obstructive pulmonary disease, unspecified: Secondary | ICD-10-CM | POA: Diagnosis not present

## 2015-10-25 DIAGNOSIS — J449 Chronic obstructive pulmonary disease, unspecified: Secondary | ICD-10-CM | POA: Diagnosis not present

## 2015-11-12 ENCOUNTER — Other Ambulatory Visit: Payer: Self-pay | Admitting: Family Medicine

## 2015-11-24 DIAGNOSIS — J449 Chronic obstructive pulmonary disease, unspecified: Secondary | ICD-10-CM | POA: Diagnosis not present

## 2015-11-29 ENCOUNTER — Encounter: Payer: Self-pay | Admitting: Family Medicine

## 2015-11-29 ENCOUNTER — Ambulatory Visit (INDEPENDENT_AMBULATORY_CARE_PROVIDER_SITE_OTHER): Payer: Commercial Managed Care - HMO | Admitting: Family Medicine

## 2015-11-29 VITALS — BP 98/70 | HR 96 | Temp 97.6°F | Resp 18 | Wt 151.2 lb

## 2015-11-29 DIAGNOSIS — J441 Chronic obstructive pulmonary disease with (acute) exacerbation: Secondary | ICD-10-CM | POA: Diagnosis not present

## 2015-11-29 DIAGNOSIS — E785 Hyperlipidemia, unspecified: Secondary | ICD-10-CM | POA: Diagnosis not present

## 2015-11-29 DIAGNOSIS — I10 Essential (primary) hypertension: Secondary | ICD-10-CM

## 2015-11-29 NOTE — Progress Notes (Signed)
Patient ID: Hailey Gibbs, female   DOB: 13-Dec-1945, 69 y.o.   MRN: [id number]   Patient: Hailey Gibbs Female    DOB: [birth year]   69 y.o.   MRN: [id number] Visit Date: 11/29/2015  Today's Provider: Dortha Kern, PA   Chief Complaint  Patient presents with  . Hypertension  . COPD  . Hyperlipidemia  . Follow-up   Subjective:    Hypertension This is a chronic problem. The current episode started more than 1 year ago. The problem is controlled. Associated symptoms include chest pain, palpitations and sweats. The current treatment provides significant improvement. There are no compliance problems.   Hyperlipidemia This is a chronic problem. The current episode started more than 1 year ago. The problem is controlled. Associated symptoms include chest pain. Current antihyperlipidemic treatment includes diet change.   COPD: Still smoking 1/2 ppd but feeling very low energy level. Using oxygen bottles when leaving the house and a concentrator when at home. No sputum production. Without oxygen, pulse oximetry drops to 80-81% but back up to 94-96% on the oxygen at 2 LPM. Still on Symbicort, Spiriva and Albuterol prn.   Previous Medications   ACETAMINOPHEN (TYLENOL) 500 MG TABLET    Take 1,000 mg by mouth every 6 (six) hours as needed for mild pain or headache.    ALBUTEROL (PROVENTIL HFA;VENTOLIN HFA) 108 (90 BASE) MCG/ACT INHALER    Inhale 1-2 puffs into the lungs 4 (four) times daily as needed for wheezing or shortness of breath.   ASPIRIN 81 MG CHEWABLE TABLET    Chew 81 mg by mouth at bedtime.    ATORVASTATIN (LIPITOR) 10 MG TABLET    Take 10 mg by mouth at bedtime.   CETIRIZINE (ZYRTEC) 10 MG TABLET    Take 10 mg by mouth at bedtime.    CLOPIDOGREL (PLAVIX) 75 MG TABLET    Take 1 tablet (75 mg total) by mouth daily with breakfast.   LISINOPRIL (PRINIVIL,ZESTRIL) 5 MG TABLET    Take by mouth. Reported on 11/29/2015   MULTIPLE VITAMIN (MULTIVITAMIN) TABLET    Take 1 tablet by mouth  at bedtime.    RANITIDINE (ZANTAC) 150 MG TABLET    Take 150 mg by mouth daily.   SERTRALINE (ZOLOFT) 100 MG TABLET    1 (ONE) TABLET, ORAL, AT BEDTIME DAILY BY MOUTH   SYMBICORT 160-4.5 MCG/ACT INHALER    INHALE 2 PUFFS TWICE A DAY   TIOTROPIUM (SPIRIVA) 18 MCG INHALATION CAPSULE    Place 18 mcg into inhaler and inhale daily.    Review of Systems  Constitutional: Positive for fatigue.  HENT: Negative.   Eyes: Negative.   Respiratory: Negative.   Cardiovascular: Positive for chest pain and palpitations.  Gastrointestinal: Negative.   Endocrine: Negative.   Genitourinary: Negative.   Musculoskeletal: Negative.   Skin: Negative.   Neurological: Negative.   Hematological: Negative.   Psychiatric/Behavioral: Negative.     Social History  Substance Use Topics  . Smoking status: Current Every Day Smoker -- 0.75 packs/day for 45 years    Types: Cigarettes  . Smokeless tobacco: Never Used     Comment: pt uses nictine patches also  . Alcohol Use: No   Objective:   BP 98/70 mmHg  Pulse 96  Temp(Src) 97.6 F (36.4 C) (Oral)  Resp 18  Wt 151 lb 3.2 oz (68.584 kg)  SpO2 94% BP Readings from Last 3 Encounters:  11/29/15 98/70  08/30/15 110/86  03/11/15 104/68   Wt Readings from Last  3 Encounters:  11/29/15 151 lb 3.2 oz (68.584 kg)  08/30/15 148 lb (67.132 kg)  03/11/15 145 lb (65.772 kg)    Physical Exam  Constitutional: She is oriented to person, place, and time. She appears well-developed and well-nourished.  HENT:  Head: Normocephalic.  Right Ear: External ear normal.  Left Ear: External ear normal.  Nose: Nose normal.  Mouth/Throat: Oropharynx is clear and moist.  Eyes: Conjunctivae and EOM are normal.  Neck: Normal range of motion. Neck supple.  Cardiovascular: Normal rate, regular rhythm and normal heart sounds.   Left carotid bruit unchanged.  Pulmonary/Chest:  Very distant breath sounds. On constant oxygen by nasal cannula.  Abdominal: Soft. Bowel sounds are  normal.  Neurological: She is alert and oriented to person, place, and time.  Psychiatric: She has a normal mood and affect. Her behavior is normal.      Assessment & Plan:     1. Essential hypertension BP low today. No longer taking the Lisinopril. Recommend she increase fluid intake and stop all smoking. Will recheck routine labs. - CBC with Differential/Platelet  2. Hyperlipidemia No longer taking the Lipitor. States efforts to stop smoking causing appetite to increase. Will recheck CMP and lipid panel. Follow up pending reports. - Comprehensive metabolic panel - Lipid panel  3. Obstructive chronic bronchitis with exacerbation (HCC) Still getting drops of pulse oximetry to 80-81% when she goes without oxygen. Difficulty handling the oxygen bottles due to weight. Given prescription for a portable concentrator for travel to doctor's appointments and grocery shopping. Still taking the Spiriva, Albuterol and Symbicort regularly. Recheck pending reports of labs.

## 2015-11-30 LAB — LIPID PANEL
CHOL/HDL RATIO: 3.9 ratio (ref 0.0–4.4)
Cholesterol, Total: 210 mg/dL — ABNORMAL HIGH (ref 100–199)
HDL: 54 mg/dL (ref >39)
LDL Calculated: 104 mg/dL — ABNORMAL HIGH (ref 0–99)
TRIGLYCERIDES: 262 mg/dL — AB (ref 0–149)
VLDL Cholesterol Cal: 52 mg/dL — ABNORMAL HIGH (ref 5–40)

## 2015-11-30 LAB — COMPREHENSIVE METABOLIC PANEL
ALT: 13 IU/L (ref 0–32)
AST: 14 IU/L (ref 0–40)
Albumin/Globulin Ratio: 1.9 (ref 1.1–2.5)
Albumin: 4.6 g/dL (ref 3.6–4.8)
Alkaline Phosphatase: 112 IU/L (ref 39–117)
BUN/Creatinine Ratio: 8 — ABNORMAL LOW (ref 11–26)
BUN: 5 mg/dL — ABNORMAL LOW (ref 8–27)
Bilirubin Total: 0.2 mg/dL (ref 0.0–1.2)
CALCIUM: 10.2 mg/dL (ref 8.7–10.3)
CO2: 27 mmol/L (ref 18–29)
Chloride: 96 mmol/L (ref 96–106)
Creatinine, Ser: 0.63 mg/dL (ref 0.57–1.00)
GFR, EST AFRICAN AMERICAN: 107 mL/min/{1.73_m2} (ref >59)
GFR, EST NON AFRICAN AMERICAN: 92 mL/min/{1.73_m2} (ref >59)
GLUCOSE: 96 mg/dL (ref 65–99)
Globulin, Total: 2.4 g/dL (ref 1.5–4.5)
Potassium: 5 mmol/L (ref 3.5–5.2)
SODIUM: 140 mmol/L (ref 134–144)
TOTAL PROTEIN: 7 g/dL (ref 6.0–8.5)

## 2015-11-30 LAB — CBC WITH DIFFERENTIAL/PLATELET
BASOS ABS: 0 10*3/uL (ref 0.0–0.2)
Basos: 0 %
EOS (ABSOLUTE): 0.2 10*3/uL (ref 0.0–0.4)
Eos: 2 %
Hematocrit: 38.6 % (ref 34.0–46.6)
Hemoglobin: 12.2 g/dL (ref 11.1–15.9)
IMMATURE GRANS (ABS): 0 10*3/uL (ref 0.0–0.1)
IMMATURE GRANULOCYTES: 0 %
LYMPHS: 33 %
Lymphocytes Absolute: 2.5 10*3/uL (ref 0.7–3.1)
MCH: 29.2 pg (ref 26.6–33.0)
MCHC: 31.6 g/dL (ref 31.5–35.7)
MCV: 92 fL (ref 79–97)
Monocytes Absolute: 0.5 10*3/uL (ref 0.1–0.9)
Monocytes: 7 %
Neutrophils Absolute: 4.5 10*3/uL (ref 1.4–7.0)
Neutrophils: 58 %
PLATELETS: 376 10*3/uL (ref 150–379)
RBC: 4.18 x10E6/uL (ref 3.77–5.28)
RDW: 13.9 % (ref 12.3–15.4)
WBC: 7.7 10*3/uL (ref 3.4–10.8)

## 2015-12-08 ENCOUNTER — Telehealth: Payer: Self-pay

## 2015-12-08 MED ORDER — ATORVASTATIN CALCIUM 10 MG PO TABS: 10.0000 mg | ORAL_TABLET | Freq: Every day | ORAL | Status: DC

## 2015-12-08 NOTE — Telephone Encounter (Signed)
Patient advised as directed below. Patient verbalized understanding and agrees with treatment plan. RX sent to pharmacy. Patient scheduled for a follow up appointment.  

## 2015-12-08 NOTE — Telephone Encounter (Signed)
-----   Message from Jodell Ciproennis E Rio V[place], [place] sent at 12/05/2015  1:33 PM EST ----- Blood tests essentially in good shape except cholesterol and triglycerides high. Need to be back on the Lipitor 10 mg qd #30 & 3 RF and low fat diet. Recheck lipid levels in 3 months.

## 2015-12-11 ENCOUNTER — Other Ambulatory Visit: Payer: Self-pay | Admitting: Family Medicine

## 2015-12-25 DIAGNOSIS — J449 Chronic obstructive pulmonary disease, unspecified: Secondary | ICD-10-CM | POA: Diagnosis not present

## 2016-01-12 ENCOUNTER — Telehealth: Payer: Self-pay | Admitting: Family Medicine

## 2016-01-12 NOTE — Telephone Encounter (Signed)
Hailey Gibbs with [name] has questions about a hand written Rx for a portable oxygen.  [id number]#[phone]

## 2016-01-12 NOTE — Telephone Encounter (Signed)
[name] is requesting a new RX for portable and stationary oxygen for patient. Okey Regal with [name] will fax recommendations to [place].

## 2016-01-24 DIAGNOSIS — J449 Chronic obstructive pulmonary disease, unspecified: Secondary | ICD-10-CM | POA: Diagnosis not present

## 2016-01-25 DIAGNOSIS — J449 Chronic obstructive pulmonary disease, unspecified: Secondary | ICD-10-CM | POA: Diagnosis not present

## 2016-02-06 DIAGNOSIS — R0902 Hypoxemia: Secondary | ICD-10-CM | POA: Diagnosis not present

## 2016-02-06 DIAGNOSIS — J449 Chronic obstructive pulmonary disease, unspecified: Secondary | ICD-10-CM | POA: Diagnosis not present

## 2016-02-07 ENCOUNTER — Other Ambulatory Visit: Payer: Self-pay | Admitting: Family Medicine

## 2016-02-07 DIAGNOSIS — I6529 Occlusion and stenosis of unspecified carotid artery: Secondary | ICD-10-CM | POA: Diagnosis not present

## 2016-02-07 DIAGNOSIS — I6523 Occlusion and stenosis of bilateral carotid arteries: Secondary | ICD-10-CM | POA: Diagnosis not present

## 2016-02-07 DIAGNOSIS — J449 Chronic obstructive pulmonary disease, unspecified: Secondary | ICD-10-CM | POA: Diagnosis not present

## 2016-02-07 DIAGNOSIS — I6522 Occlusion and stenosis of left carotid artery: Secondary | ICD-10-CM | POA: Diagnosis not present

## 2016-02-21 DIAGNOSIS — J449 Chronic obstructive pulmonary disease, unspecified: Secondary | ICD-10-CM | POA: Diagnosis not present

## 2016-03-05 DIAGNOSIS — R0902 Hypoxemia: Secondary | ICD-10-CM | POA: Diagnosis not present

## 2016-03-05 DIAGNOSIS — J449 Chronic obstructive pulmonary disease, unspecified: Secondary | ICD-10-CM | POA: Diagnosis not present

## 2016-03-06 ENCOUNTER — Encounter: Payer: Self-pay | Admitting: Family Medicine

## 2016-03-06 ENCOUNTER — Ambulatory Visit (INDEPENDENT_AMBULATORY_CARE_PROVIDER_SITE_OTHER): Payer: Commercial Managed Care - HMO | Admitting: Family Medicine

## 2016-03-06 VITALS — BP 110/70 | HR 96 | Temp 98.3°F | Resp 16 | Wt 151.2 lb

## 2016-03-06 DIAGNOSIS — E785 Hyperlipidemia, unspecified: Secondary | ICD-10-CM | POA: Diagnosis not present

## 2016-03-06 DIAGNOSIS — J441 Chronic obstructive pulmonary disease with (acute) exacerbation: Secondary | ICD-10-CM

## 2016-03-06 DIAGNOSIS — F418 Other specified anxiety disorders: Secondary | ICD-10-CM

## 2016-03-06 DIAGNOSIS — F419 Anxiety disorder, unspecified: Secondary | ICD-10-CM

## 2016-03-06 DIAGNOSIS — I10 Essential (primary) hypertension: Secondary | ICD-10-CM | POA: Diagnosis not present

## 2016-03-06 DIAGNOSIS — F329 Major depressive disorder, single episode, unspecified: Secondary | ICD-10-CM

## 2016-03-06 NOTE — Progress Notes (Signed)
Patient ID: Hailey Gibbs, female   DOB: 08-Aug-1946, 70 y.o.   MRN: [id number]   --------------------------------------------------------------------       Patient: Hailey Gibbs Female    DOB: 1946/10/24   70 y.o.   MRN: [id number] Visit Date: 03/06/2016  Today's Provider: Dortha Kern, PA   Chief Complaint  Patient presents with  . Hyperlipidemia  . Anxiety  . Insomnia   Subjective:    Hyperlipidemia This is a chronic problem. Recent lipid tests were reviewed and are high. Factors aggravating her hyperlipidemia include fatty foods and smoking. Associated symptoms include shortness of breath. Pertinent negatives include no myalgias. Current antihyperlipidemic treatment includes statins and diet change. There are no compliance problems.    Patient is present in office today for follow up, she states that she needs refill on her medications today.    Hypertension, follow-up:  BP Readings from Last 3 Encounters:  03/06/16 110/70  11/29/15 98/70  08/30/15 110/86    She was last seen for hypertension 3 months ago.  BP at that visit was 98/70 Management changes since that visit include: stopped Lisinopril. She reports good  compliance with treatment, last office visit patient stopped Lisinopril and encouraged to increase fluid intake and quit smoking She is not having side effects.  She is not exercising. She is not adherent to low salt diet.   Outside blood pressures are not being checked She is experiencing none.  Patient denies none.   Cardiovascular risk factors include smoking/ tobacco exposure.  Use of agents associated with hypertension: none.     Weight trend: stable Wt Readings from Last 3 Encounters:  03/06/16 151 lb 3.2 oz (68.584 kg)  11/29/15 151 lb 3.2 oz (68.584 kg)  08/30/15 148 lb (67.132 kg)    Current diet: in general, a "healthy" diet    ------------------------------------------------------------------------   Insomnia  She  presents today for evaluation of insomnia. Onset was 3years ago. Insomnia is getting worse. She has trouble sleeping several times a week.   She has difficulty FALLING asleep. She has difficulty STAYING asleep. She does not wake frequently to urinate. She does not have urge to move legs when resting. She is not having pain when trying to sleep  She is not having anxiety. She is not having a lot of stress in her life. . She is not having depression.  She is not taking stimulant medications.  She is not taking new medications:  She is not taking OTC sleeping aid. Wants to try Melatonin. She is not taking medications to help sleep. She is not drinking alcohol to help sleep. She is not using illicit drugs.  Past Medical History  Diagnosis Date  . Carotid artery occlusion   . Hypertension   . COPD (chronic obstructive pulmonary disease) (HCC)   . Seasonal allergies   . Hyperlipidemia   . Shortness of breath dyspnea   . Depression   . Anxiety   . GERD (gastroesophageal reflux disease)    Past Surgical History  Procedure Laterality Date  . Vaginal hysterectomy    . Tubal ligation    . Endarterectomy Left 06/23/2015    Procedure: ENDARTERECTOMY CAROTID;  Surgeon: Annice Needy, MD;  Location: [facility] ORS;  Service: Vascular;  Laterality: Left;   Family History  Problem Relation Age of Onset  . Heart disease Father 44    CABG   . Hyperlipidemia Father   . Pneumonia Father   . Dementia Mother   . COPD Mother   .  Healthy Sister   . Parkinson's disease Brother    Allergies  Allergen Reactions  . Codeine Anxiety        Previous Medications   ACETAMINOPHEN (TYLENOL) 500 MG TABLET    Take 1,000 mg by mouth every 6 (six) hours as needed for mild pain or headache.    ALBUTEROL (PROVENTIL HFA;VENTOLIN HFA) 108 (90 BASE) MCG/ACT INHALER    Inhale 1-2 puffs into the lungs 4 (four) times daily as needed for wheezing or shortness of breath.   ASPIRIN 81 MG CHEWABLE TABLET    Chew 81  mg by mouth at bedtime.    ATORVASTATIN (LIPITOR) 10 MG TABLET    Take 1 tablet (10 mg total) by mouth daily.   CETIRIZINE (ZYRTEC) 10 MG TABLET    Take 10 mg by mouth at bedtime.    MULTIPLE VITAMIN (MULTIVITAMIN) TABLET    Take 1 tablet by mouth at bedtime.    SERTRALINE (ZOLOFT) 100 MG TABLET    1 (ONE) TABLET, ORAL, AT BEDTIME DAILY BY MOUTH   SPIRIVA HANDIHALER 18 MCG INHALATION CAPSULE    INHALE 1 CAPSULE ONCE A DAY   SYMBICORT 160-4.5 MCG/ACT INHALER    INHALE 2 PUFFS TWICE A DAY    Review of Systems  Constitutional: Negative.   HENT: Negative.   Eyes: Negative.   Respiratory: Positive for shortness of breath.   Cardiovascular: Negative.   Musculoskeletal: Negative for myalgias.  Psychiatric/Behavioral: The patient is nervous/anxious.        Not sleeping well. Better with use of Sertraline at bedtime.    Social History  Substance Use Topics  . Smoking status: Current Every Day Smoker -- 0.75 packs/day for 45 years    Types: Cigarettes  . Smokeless tobacco: Never Used     Comment: pt uses nictine patches also  . Alcohol Use: No   Objective:   BP 110/70 mmHg  Pulse 96  Temp(Src) 98.3 F (36.8 C) (Oral)  Resp 16  Wt 151 lb 3.2 oz (68.584 kg)  SpO2 93%  Physical Exam  Constitutional: She is oriented to person, place, and time. She appears well-developed and well-nourished.  HENT:  Head: Normocephalic.  Right Ear: External ear normal.  Left Ear: External ear normal.  Nose: Nose normal.  Mouth/Throat: Oropharynx is clear and moist.  Eyes: Conjunctivae and EOM are normal.  Neck: Neck supple.  Well healed scar from left carotid endarterectomy January 2017.  Cardiovascular: Normal rate, regular rhythm and normal heart sounds.   Pulmonary/Chest:  Distant breath sounds. On oxygen 2 LPM 24 hours a day.  Abdominal: Soft. Bowel sounds are normal.  Lymphadenopathy:    She has no cervical adenopathy.  Neurological: She is alert and oriented to person, place, and time.    Skin: No rash noted.  Psychiatric: She has a normal mood and affect. Her behavior is normal.      Assessment & Plan:     1. Hyperlipidemia Tolerating Lipitor 10 mg qd without side effects. Total cholesterol 210, triglycerides 262, HDL 54 and LDL 104 on 11-29-15. Continue low fat diet and recheck labs.   - Comprehensive metabolic panel - Lipid panel  2. Anxiety and depression Stable and well controlled with use of Sertraline 100 mg HS. Continue present dosage. Denies side effects.  3. Obstructive chronic bronchitis with exacerbation (HCC) Dyspnea unchanged. Pulse oximetry 93% on 2 LPM oxygen by nasal cannula today. Continue present level 24 hours a day with Spiriva 18 mcg qd, Symbicort 160-4.5 mcg/ACT BID  and Ventolin-HFA 1-2 inhalations QID prn rescue. Recheck in 3 months.  4. Essential hypertension Stable and well controlled. Restricting sodium in diet and no hypotensive episodes since stopping the Lisinopril in September 2016. Recheck labs and follow up prn. - CBC with Differential/Platelet       Dortha Kern, PA  [facility]

## 2016-03-07 DIAGNOSIS — E785 Hyperlipidemia, unspecified: Secondary | ICD-10-CM | POA: Diagnosis not present

## 2016-03-07 DIAGNOSIS — I10 Essential (primary) hypertension: Secondary | ICD-10-CM | POA: Diagnosis not present

## 2016-03-08 LAB — CBC WITH DIFFERENTIAL/PLATELET
BASOS ABS: 0 10*3/uL (ref 0.0–0.2)
Basos: 0 %
EOS (ABSOLUTE): 0.1 10*3/uL (ref 0.0–0.4)
Eos: 2 %
Hematocrit: 38.8 % (ref 34.0–46.6)
Hemoglobin: 12.5 g/dL (ref 11.1–15.9)
IMMATURE GRANS (ABS): 0 10*3/uL (ref 0.0–0.1)
IMMATURE GRANULOCYTES: 0 %
LYMPHS: 31 %
Lymphocytes Absolute: 2.4 10*3/uL (ref 0.7–3.1)
MCH: 30.6 pg (ref 26.6–33.0)
MCHC: 32.2 g/dL (ref 31.5–35.7)
MCV: 95 fL (ref 79–97)
MONOS ABS: 0.9 10*3/uL (ref 0.1–0.9)
Monocytes: 12 %
NEUTROS PCT: 55 %
Neutrophils Absolute: 4.1 10*3/uL (ref 1.4–7.0)
PLATELETS: 371 10*3/uL (ref 150–379)
RBC: 4.09 x10E6/uL (ref 3.77–5.28)
RDW: 13.8 % (ref 12.3–15.4)
WBC: 7.5 10*3/uL (ref 3.4–10.8)

## 2016-03-08 LAB — LIPID PANEL
CHOL/HDL RATIO: 2.9 ratio (ref 0.0–4.4)
Cholesterol, Total: 180 mg/dL (ref 100–199)
HDL: 62 mg/dL (ref >39)
LDL Calculated: 84 mg/dL (ref 0–99)
TRIGLYCERIDES: 168 mg/dL — AB (ref 0–149)
VLDL CHOLESTEROL CAL: 34 mg/dL (ref 5–40)

## 2016-03-08 LAB — COMPREHENSIVE METABOLIC PANEL
A/G RATIO: 2.1 (ref 1.2–2.2)
ALT: 15 IU/L (ref 0–32)
AST: 19 IU/L (ref 0–40)
Albumin: 4.9 g/dL — ABNORMAL HIGH (ref 3.6–4.8)
Alkaline Phosphatase: 104 IU/L (ref 39–117)
BUN/Creatinine Ratio: 17 (ref 11–26)
BUN: 11 mg/dL (ref 8–27)
Bilirubin Total: <0.2 mg/dL (ref 0.0–1.2)
CALCIUM: 10.3 mg/dL (ref 8.7–10.3)
CO2: 29 mmol/L (ref 18–29)
CREATININE: 0.63 mg/dL (ref 0.57–1.00)
Chloride: 96 mmol/L (ref 96–106)
GFR calc Af Amer: 107 mL/min/{1.73_m2} (ref >59)
GFR, EST NON AFRICAN AMERICAN: 92 mL/min/{1.73_m2} (ref >59)
GLUCOSE: 93 mg/dL (ref 65–99)
Globulin, Total: 2.3 g/dL (ref 1.5–4.5)
POTASSIUM: 5.3 mmol/L — AB (ref 3.5–5.2)
Sodium: 141 mmol/L (ref 134–144)
Total Protein: 7.2 g/dL (ref 6.0–8.5)

## 2016-03-12 ENCOUNTER — Other Ambulatory Visit: Payer: Self-pay

## 2016-03-12 MED ORDER — ATORVASTATIN CALCIUM 10 MG PO TABS: 10.0000 mg | ORAL_TABLET | Freq: Every day | ORAL | Status: DC

## 2016-03-12 NOTE — Telephone Encounter (Signed)
Patient advised as directed below. Patient verbalized understanding.   Patient is requesting a ref[facility] on [facility] [place]. Patient states she is completely out of the medication.

## 2016-03-12 NOTE — Telephone Encounter (Signed)
-----   Message from Tamsen Roersennis E [place], [place] sent at 03/09/2016  5:54 PM EDT ----- All blood tests normal. Triglycerides are improved but still above the goal of <150. Continue present medications and recheck in 3 months.

## 2016-03-23 DIAGNOSIS — J449 Chronic obstructive pulmonary disease, unspecified: Secondary | ICD-10-CM | POA: Diagnosis not present

## 2016-04-05 DIAGNOSIS — J449 Chronic obstructive pulmonary disease, unspecified: Secondary | ICD-10-CM | POA: Diagnosis not present

## 2016-04-05 DIAGNOSIS — R0902 Hypoxemia: Secondary | ICD-10-CM | POA: Diagnosis not present

## 2016-04-06 ENCOUNTER — Other Ambulatory Visit: Payer: Self-pay | Admitting: Family Medicine

## 2016-04-22 DIAGNOSIS — J449 Chronic obstructive pulmonary disease, unspecified: Secondary | ICD-10-CM | POA: Diagnosis not present

## 2016-05-01 IMAGING — CR DG CHEST 2V
1 series · 2 of 2 positions shown · non-contrast
Comparison: March 26, 2014.

CLINICAL DATA: Productive cough.

EXAM:
CHEST  2 VIEW

[Series 1: dg chest 2 view · 0.14mm/px · 2 of 2 slices shown]
[im 1/2]
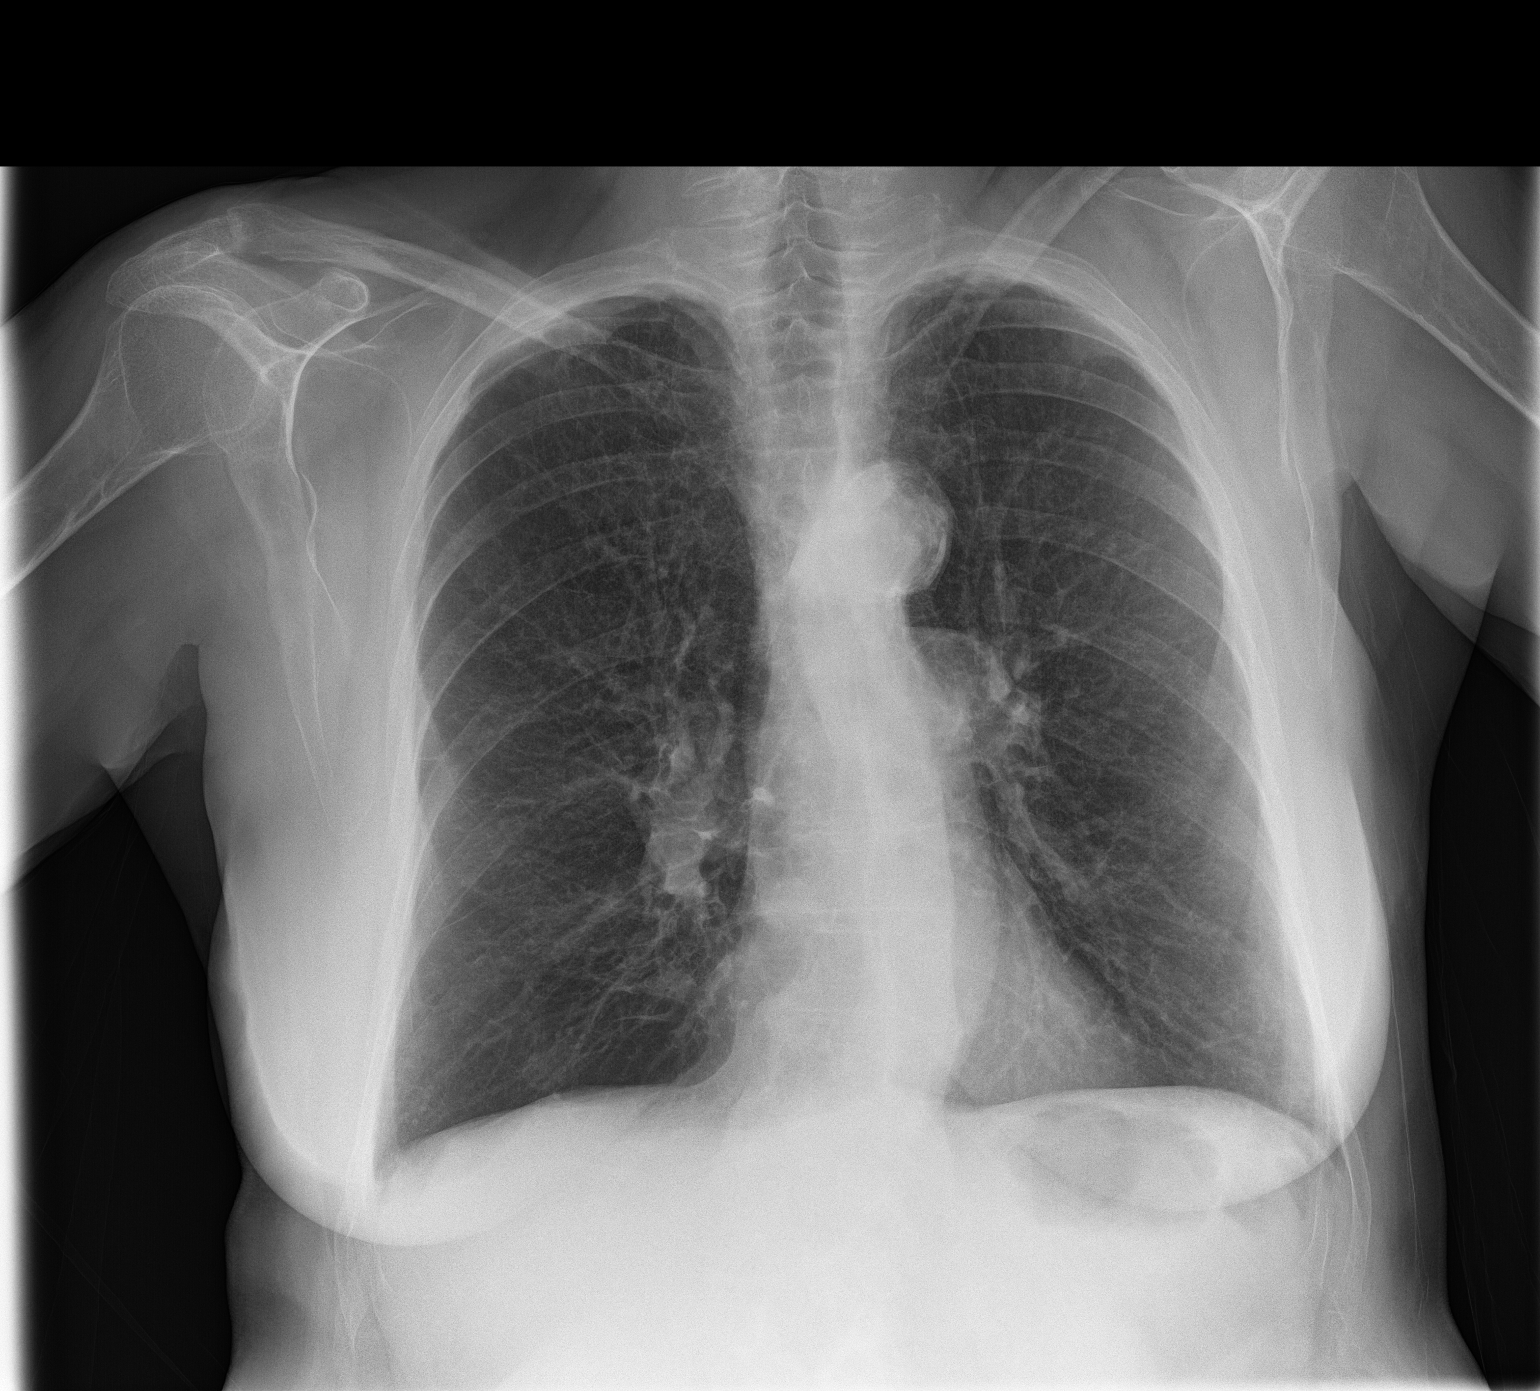
[im 2/2]
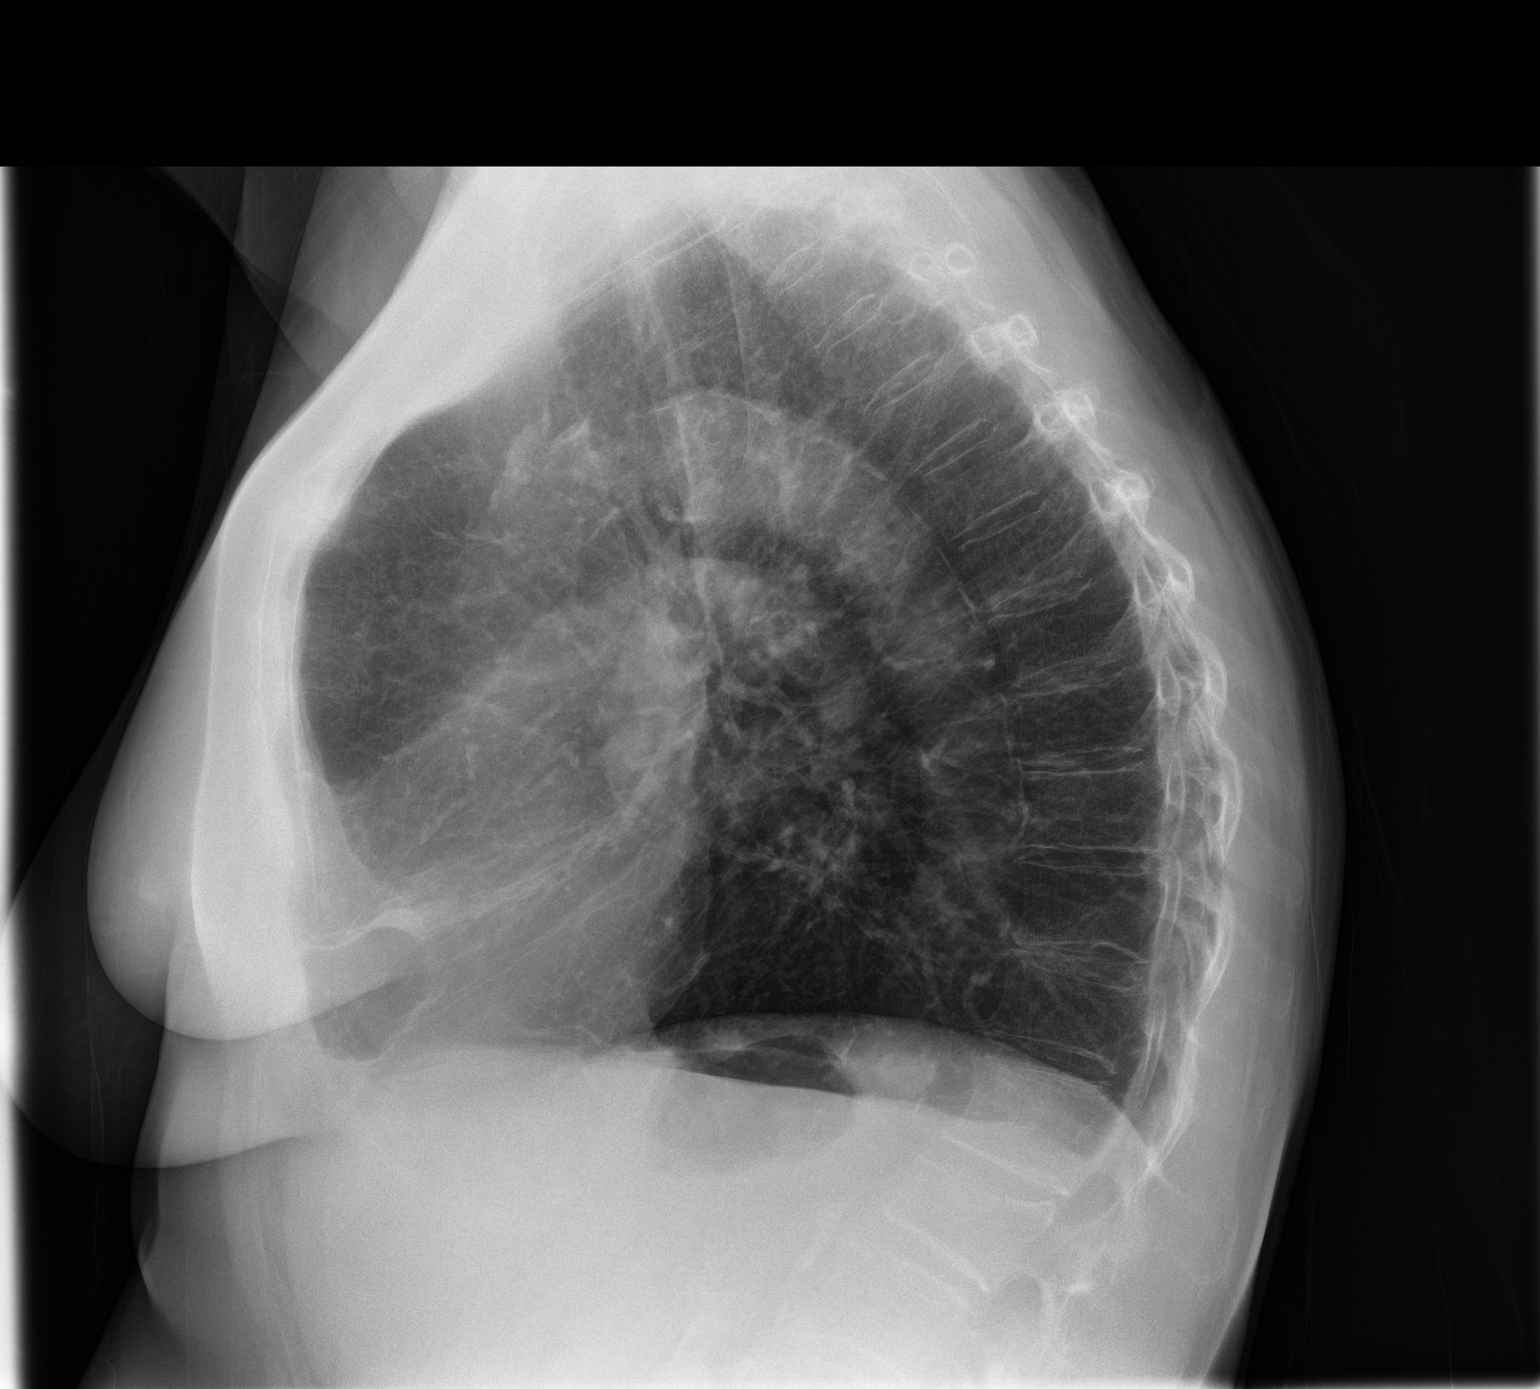

[2 of 2 positions shown; findings below may reference images not displayed]

FINDINGS: The heart size and mediastinal contours are within normal limits.
Both lungs are clear. No pneumothorax or pleural effusion is noted.
The visualized skeletal structures are unremarkable.
IMPRESSION: No active cardiopulmonary disease.

## 2016-05-05 DIAGNOSIS — J449 Chronic obstructive pulmonary disease, unspecified: Secondary | ICD-10-CM | POA: Diagnosis not present

## 2016-05-05 DIAGNOSIS — R0902 Hypoxemia: Secondary | ICD-10-CM | POA: Diagnosis not present

## 2016-05-23 DIAGNOSIS — J449 Chronic obstructive pulmonary disease, unspecified: Secondary | ICD-10-CM | POA: Diagnosis not present

## 2016-06-05 DIAGNOSIS — J449 Chronic obstructive pulmonary disease, unspecified: Secondary | ICD-10-CM | POA: Diagnosis not present

## 2016-06-05 DIAGNOSIS — R0902 Hypoxemia: Secondary | ICD-10-CM | POA: Diagnosis not present

## 2016-06-22 DIAGNOSIS — J449 Chronic obstructive pulmonary disease, unspecified: Secondary | ICD-10-CM | POA: Diagnosis not present

## 2016-07-05 DIAGNOSIS — R0902 Hypoxemia: Secondary | ICD-10-CM | POA: Diagnosis not present

## 2016-07-05 DIAGNOSIS — J449 Chronic obstructive pulmonary disease, unspecified: Secondary | ICD-10-CM | POA: Diagnosis not present

## 2016-07-23 DIAGNOSIS — J449 Chronic obstructive pulmonary disease, unspecified: Secondary | ICD-10-CM | POA: Diagnosis not present

## 2016-08-05 ENCOUNTER — Other Ambulatory Visit: Payer: Self-pay | Admitting: Family Medicine

## 2016-08-05 DIAGNOSIS — R0902 Hypoxemia: Secondary | ICD-10-CM | POA: Diagnosis not present

## 2016-08-05 DIAGNOSIS — J449 Chronic obstructive pulmonary disease, unspecified: Secondary | ICD-10-CM | POA: Diagnosis not present

## 2016-08-08 ENCOUNTER — Other Ambulatory Visit: Payer: Self-pay | Admitting: Family Medicine

## 2016-08-23 DIAGNOSIS — J449 Chronic obstructive pulmonary disease, unspecified: Secondary | ICD-10-CM | POA: Diagnosis not present

## 2016-09-05 DIAGNOSIS — R0902 Hypoxemia: Secondary | ICD-10-CM | POA: Diagnosis not present

## 2016-09-05 DIAGNOSIS — J449 Chronic obstructive pulmonary disease, unspecified: Secondary | ICD-10-CM | POA: Diagnosis not present

## 2016-09-22 DIAGNOSIS — J449 Chronic obstructive pulmonary disease, unspecified: Secondary | ICD-10-CM | POA: Diagnosis not present

## 2016-09-24 ENCOUNTER — Ambulatory Visit (INDEPENDENT_AMBULATORY_CARE_PROVIDER_SITE_OTHER): Payer: Commercial Managed Care - HMO | Admitting: Family Medicine

## 2016-09-24 ENCOUNTER — Encounter: Payer: Self-pay | Admitting: Family Medicine

## 2016-09-24 VITALS — BP 122/76 | HR 98 | Temp 97.7°F | Resp 18 | Wt 152.2 lb

## 2016-09-24 DIAGNOSIS — J449 Chronic obstructive pulmonary disease, unspecified: Secondary | ICD-10-CM

## 2016-09-24 DIAGNOSIS — E782 Mixed hyperlipidemia: Secondary | ICD-10-CM | POA: Diagnosis not present

## 2016-09-24 DIAGNOSIS — I10 Essential (primary) hypertension: Secondary | ICD-10-CM | POA: Diagnosis not present

## 2016-09-24 DIAGNOSIS — F418 Other specified anxiety disorders: Secondary | ICD-10-CM | POA: Diagnosis not present

## 2016-09-24 DIAGNOSIS — F419 Anxiety disorder, unspecified: Secondary | ICD-10-CM

## 2016-09-24 DIAGNOSIS — F329 Major depressive disorder, single episode, unspecified: Secondary | ICD-10-CM

## 2016-09-24 MED ORDER — SERTRALINE HCL 100 MG PO TABS: ORAL_TABLET | ORAL | 3 refills | Status: DC

## 2016-09-24 NOTE — Progress Notes (Signed)
Patient: Hailey Gibbs Female    DOB: [birth year]   70 y.o.   MRN: [id number] Visit Date: 09/24/2016  Today's Provider: Dortha Kern, PA   Chief Complaint  Patient presents with  . Hyperlipidemia  . Hypertension  . COPD  . Follow-up   Subjective:    HPI   Hypertension, follow-up:  BP Readings from Last 3 Encounters:  09/24/16 122/76  03/06/16 110/70  11/29/15 98/70    She was last seen for hypertension 7 months ago.  BP at that visit was 110/70. Management since that visit includes continue restricting sodium intake.She reports good compliance with treatment. She is not having side effects.  She is not exercising. She is adherent to low salt diet.   Outside blood pressures are not being checked. She is experiencing none.  Patient denies chest pain, irregular heart beat and palpitations.   Cardiovascular risk factors include advanced age (older than 42 for men, 19 for women), dyslipidemia and smoking/ tobacco exposure.  Use of agents associated with hypertension: none.   ------------------------------------------------------------------------    Lipid/Cholesterol, Follow-up:   Last seen for this 7 months ago.  Management since that visit includes continue medication.  Last Lipid Panel:    Component Value Date/Time   CHOL 180 03/07/2016 1152   CHOL 110 02/26/2014 0412   TRIG 168 (H) 03/07/2016 1152   TRIG 199 02/26/2014 0412   HDL 62 03/07/2016 1152   HDL 13 (L) 02/26/2014 0412   CHOLHDL 2.9 03/07/2016 1152   VLDL 40 02/26/2014 0412   LDLCALC 84 03/07/2016 1152   LDLCALC 57 02/26/2014 0412    She reports excellent compliance with treatment. She is not having side effects.   Wt Readings from Last 3 Encounters:  09/24/16 152 lb 3.2 oz (69 kg)  03/06/16 151 lb 3.2 oz (68.6 kg)  11/29/15 151 lb 3.2 oz (68.6 kg)    ------------------------------------------------------------------------ COPD: 7 month follow up: Using oxygen bottles when leaving  the house and a concentrator when at home at 2 LPM. Patient denies sputum production. Patient is taking Symbicort, Spiriva and Albuterol prn. Fatigues easily and becomes very dyspneic walking with or without oxygen. Still smoking 1 ppd. States she smokes because of anxiety and irritability from relationship with daughter-in-law. States she wants to be back on her SSRI (Zoloft).   Previous Medications   ACETAMINOPHEN (TYLENOL) 500 MG TABLET    Take 1,000 mg by mouth every 6 (six) hours as needed for mild pain or headache.    ASPIRIN 81 MG CHEWABLE TABLET    Chew 81 mg by mouth at bedtime.    ATORVASTATIN (LIPITOR) 10 MG TABLET    TAKE 1 TABLET (10 MG TOTAL) BY MOUTH DAILY.   CETIRIZINE (ZYRTEC) 10 MG TABLET    Take 10 mg by mouth at bedtime.    MULTIPLE VITAMIN (MULTIVITAMIN) TABLET    Take 1 tablet by mouth at bedtime.    SERTRALINE (ZOLOFT) 100 MG TABLET    1 (ONE) TABLET, ORAL, AT BEDTIME DAILY BY MOUTH   SPIRIVA HANDIHALER 18 MCG INHALATION CAPSULE    INHALE 1 CAPSULE ONCE A DAY   SYMBICORT 160-4.5 MCG/ACT INHALER    INHALE 2 PUFFS TWICE A DAY   VENTOLIN HFA 108 (90 BASE) MCG/ACT INHALER    INHALE 1-2 PUFFS 4 TIMES A DAY AS NEEDED FOR WHEEZING    Review of Systems  Constitutional: Positive for fatigue.  Respiratory: Positive for shortness of breath.   Cardiovascular: Negative.  Social History  Substance Use Topics  . Smoking status: Current Every Day Smoker    Packs/day: 0.75    Years: 45.00    Types: Cigarettes  . Smokeless tobacco: Never Used     Comment: pt uses nictine patches also  . Alcohol use No   Objective:   BP 122/76 (BP Location: Right Arm, Patient Position: Sitting, Cuff Size: Normal)   Pulse 98   Temp 97.7 F (36.5 C) (Oral)   Resp 18   Wt 152 lb 3.2 oz (69 kg)   SpO2 98%   BMI 25.33 kg/m   Physical Exam  Constitutional: She is oriented to person, place, and time. She appears well-developed and well-nourished.  HENT:  Head: Normocephalic.  Right Ear:  External ear normal.  Left Ear: External ear normal.  Nose: Nose normal.  Mouth/Throat: Oropharynx is clear and moist.  Neck: Neck supple.  Cardiovascular: Normal rate and regular rhythm.   Pulmonary/Chest: She is in respiratory distress.  Dyspnea with any exertion. No wheezing today. Barrel chested with very distant breath sounds. No rales or rhonchi today.  Abdominal: Soft. Bowel sounds are normal.  Lymphadenopathy:    She has no cervical adenopathy.  Neurological: She is alert and oriented to person, place, and time.  Psychiatric: Her speech is normal. Judgment and thought content normal. Her mood appears anxious. She is agitated. Cognition and memory are normal. She expresses no suicidal plans and no homicidal plans.      Assessment & Plan:     1. COPD (chronic obstructive pulmonary disease) with chronic bronchitis (HCC) Dyspnea unchanged. Pulse oximetry 98% on 2 LPM oxygen by nasal cannula sitting today - 87% walking with oxygen - 93% sitting without oxygen- 83% walking without oxygen. Spirometry shows severe restriction and unable to repeat test. Continue present level of oxygen 24 hours a day with Spiriva 18 mcg qd, Symbicort 160-4.5 mcg/ACT BID and Ventolin-HFA 1-2 inhalations QID prn rescue. Recheck in 3 months - Spirometry with graph  2. Essential hypertension Well controlled. Continue to restrict sodium in diet and continue oxygen therapy. Recheck CBC, CMP and Lipid panel. Follow up appointment pending reports.  3. Mixed hyperlipidemia Tolerating Lipitor without side effects. Trying to follow low fat diet. Recheck CMP and lipid panel today.  4. Anxiety and depression Return of anxiety and depressive reaction over the past 4 months. Sleeping well but feeling irritable and stressed caring for her sick dog. Also, has a stressful relationship with daughter-in-law. Want to get back on the Zoloft. Will send prescription to her pharmacy and start with 50 mg hs for 3 nights then 100 mg  hs. Recheck pending lab reports.

## 2016-09-25 LAB — CBC WITH DIFFERENTIAL/PLATELET
BASOS: 0 %
Basophils Absolute: 0 10*3/uL (ref 0.0–0.2)
EOS (ABSOLUTE): 0.1 10*3/uL (ref 0.0–0.4)
EOS: 1 %
HEMATOCRIT: 38.9 % (ref 34.0–46.6)
HEMOGLOBIN: 12.7 g/dL (ref 11.1–15.9)
IMMATURE GRANS (ABS): 0 10*3/uL (ref 0.0–0.1)
IMMATURE GRANULOCYTES: 0 %
Lymphocytes Absolute: 2.1 10*3/uL (ref 0.7–3.1)
Lymphs: 26 %
MCH: 31.3 pg (ref 26.6–33.0)
MCHC: 32.6 g/dL (ref 31.5–35.7)
MCV: 96 fL (ref 79–97)
MONOCYTES: 10 %
Monocytes Absolute: 0.8 10*3/uL (ref 0.1–0.9)
NEUTROS PCT: 63 %
Neutrophils Absolute: 5 10*3/uL (ref 1.4–7.0)
Platelets: 362 10*3/uL (ref 150–379)
RBC: 4.06 x10E6/uL (ref 3.77–5.28)
RDW: 12.8 % (ref 12.3–15.4)
WBC: 8.1 10*3/uL (ref 3.4–10.8)

## 2016-09-25 LAB — COMPREHENSIVE METABOLIC PANEL
A/G RATIO: 1.8 (ref 1.2–2.2)
ALBUMIN: 4.5 g/dL (ref 3.6–4.8)
ALT: 12 IU/L (ref 0–32)
AST: 16 IU/L (ref 0–40)
Alkaline Phosphatase: 99 IU/L (ref 39–117)
BUN / CREAT RATIO: 12 (ref 12–28)
BUN: 8 mg/dL (ref 8–27)
Bilirubin Total: <0.2 mg/dL (ref 0.0–1.2)
CALCIUM: 10.6 mg/dL — AB (ref 8.7–10.3)
CO2: 33 mmol/L — AB (ref 18–29)
CREATININE: 0.66 mg/dL (ref 0.57–1.00)
Chloride: 94 mmol/L — ABNORMAL LOW (ref 96–106)
GFR, EST AFRICAN AMERICAN: 104 mL/min/{1.73_m2} (ref >59)
GFR, EST NON AFRICAN AMERICAN: 90 mL/min/{1.73_m2} (ref >59)
GLOBULIN, TOTAL: 2.5 g/dL (ref 1.5–4.5)
Glucose: 100 mg/dL — ABNORMAL HIGH (ref 65–99)
Potassium: 4.9 mmol/L (ref 3.5–5.2)
SODIUM: 140 mmol/L (ref 134–144)
TOTAL PROTEIN: 7 g/dL (ref 6.0–8.5)

## 2016-09-25 LAB — LIPID PANEL
CHOL/HDL RATIO: 2.6 ratio (ref 0.0–4.4)
Cholesterol, Total: 150 mg/dL (ref 100–199)
HDL: 57 mg/dL (ref >39)
LDL CALC: 61 mg/dL (ref 0–99)
TRIGLYCERIDES: 160 mg/dL — AB (ref 0–149)
VLDL Cholesterol Cal: 32 mg/dL (ref 5–40)

## 2016-09-27 ENCOUNTER — Telehealth: Payer: Self-pay

## 2016-09-27 NOTE — Telephone Encounter (Signed)
Patient advised. Patient scheduled for 3 month follow up appointment.  

## 2016-09-27 NOTE — Telephone Encounter (Signed)
-----   Message from Tamsen Roersennis E [place], [place] sent at 09/27/2016 11:19 AM EDT ----- Blood cell counts are normal. CO2 slightly elevated with other variations on results due to lung disease. Triglycerides above goal of <150. Continue atorvastatin daily and recheck progress in 3 months.

## 2016-10-05 DIAGNOSIS — J449 Chronic obstructive pulmonary disease, unspecified: Secondary | ICD-10-CM | POA: Diagnosis not present

## 2016-10-05 DIAGNOSIS — R0902 Hypoxemia: Secondary | ICD-10-CM | POA: Diagnosis not present

## 2016-10-23 DIAGNOSIS — J449 Chronic obstructive pulmonary disease, unspecified: Secondary | ICD-10-CM | POA: Diagnosis not present

## 2016-10-26 ENCOUNTER — Encounter: Payer: Self-pay | Admitting: Family Medicine

## 2016-10-26 DIAGNOSIS — J441 Chronic obstructive pulmonary disease with (acute) exacerbation: Secondary | ICD-10-CM

## 2016-10-26 HISTORY — DX: Chronic obstructive pulmonary disease with (acute) exacerbation: J44.1

## 2016-11-05 DIAGNOSIS — R0902 Hypoxemia: Secondary | ICD-10-CM | POA: Diagnosis not present

## 2016-11-05 DIAGNOSIS — J449 Chronic obstructive pulmonary disease, unspecified: Secondary | ICD-10-CM | POA: Diagnosis not present

## 2016-11-07 ENCOUNTER — Emergency Department: Payer: Commercial Managed Care - HMO

## 2016-11-07 ENCOUNTER — Inpatient Hospital Stay
Admission: EM | Admit: 2016-11-07 | Discharge: 2016-11-10 | DRG: 871 | Disposition: A | Discharge status: Home / Self Care | Payer: Commercial Managed Care - HMO | Attending: Internal Medicine | Admitting: Internal Medicine

## 2016-11-07 DIAGNOSIS — Z7951 Long term (current) use of inhaled steroids: Secondary | ICD-10-CM | POA: Diagnosis not present

## 2016-11-07 DIAGNOSIS — J440 Chronic obstructive pulmonary disease with acute lower respiratory infection: Secondary | ICD-10-CM | POA: Diagnosis not present

## 2016-11-07 DIAGNOSIS — I10 Essential (primary) hypertension: Secondary | ICD-10-CM | POA: Diagnosis not present

## 2016-11-07 DIAGNOSIS — D72829 Elevated white blood cell count, unspecified: Secondary | ICD-10-CM | POA: Diagnosis not present

## 2016-11-07 DIAGNOSIS — R509 Fever, unspecified: Secondary | ICD-10-CM | POA: Diagnosis not present

## 2016-11-07 DIAGNOSIS — Z79899 Other long term (current) drug therapy: Secondary | ICD-10-CM | POA: Diagnosis not present

## 2016-11-07 DIAGNOSIS — J189 Pneumonia, unspecified organism: Secondary | ICD-10-CM | POA: Diagnosis present

## 2016-11-07 DIAGNOSIS — R2689 Other abnormalities of gait and mobility: Secondary | ICD-10-CM

## 2016-11-07 DIAGNOSIS — J441 Chronic obstructive pulmonary disease with (acute) exacerbation: Secondary | ICD-10-CM | POA: Diagnosis present

## 2016-11-07 DIAGNOSIS — A419 Sepsis, unspecified organism: Secondary | ICD-10-CM | POA: Diagnosis present

## 2016-11-07 DIAGNOSIS — J9620 Acute and chronic respiratory failure, unspecified whether with hypoxia or hypercapnia: Secondary | ICD-10-CM | POA: Diagnosis not present

## 2016-11-07 DIAGNOSIS — R0602 Shortness of breath: Secondary | ICD-10-CM | POA: Diagnosis not present

## 2016-11-07 DIAGNOSIS — Z7982 Long term (current) use of aspirin: Secondary | ICD-10-CM

## 2016-11-07 DIAGNOSIS — R05 Cough: Secondary | ICD-10-CM | POA: Diagnosis not present

## 2016-11-07 DIAGNOSIS — Z9981 Dependence on supplemental oxygen: Secondary | ICD-10-CM | POA: Diagnosis not present

## 2016-11-07 DIAGNOSIS — F329 Major depressive disorder, single episode, unspecified: Secondary | ICD-10-CM | POA: Diagnosis present

## 2016-11-07 DIAGNOSIS — Z90710 Acquired absence of both cervix and uterus: Secondary | ICD-10-CM | POA: Diagnosis not present

## 2016-11-07 DIAGNOSIS — E785 Hyperlipidemia, unspecified: Secondary | ICD-10-CM | POA: Diagnosis not present

## 2016-11-07 DIAGNOSIS — E876 Hypokalemia: Secondary | ICD-10-CM | POA: Diagnosis present

## 2016-11-07 DIAGNOSIS — F17210 Nicotine dependence, cigarettes, uncomplicated: Secondary | ICD-10-CM | POA: Diagnosis present

## 2016-11-07 LAB — COMPREHENSIVE METABOLIC PANEL
ALK PHOS: 126 U/L (ref 38–126)
ALT: 13 U/L — ABNORMAL LOW (ref 14–54)
ANION GAP: 8 (ref 5–15)
AST: 16 U/L (ref 15–41)
Albumin: 3.6 g/dL (ref 3.5–5.0)
BILIRUBIN TOTAL: 0.4 mg/dL (ref 0.3–1.2)
BUN: 10 mg/dL (ref 6–20)
CALCIUM: 9.1 mg/dL (ref 8.9–10.3)
CO2: 31 mmol/L (ref 22–32)
Chloride: 96 mmol/L — ABNORMAL LOW (ref 101–111)
Creatinine, Ser: 0.52 mg/dL (ref 0.44–1.00)
GFR calc Af Amer: >60 mL/min (ref >60)
Glucose, Bld: 130 mg/dL — ABNORMAL HIGH (ref 65–99)
POTASSIUM: 3.3 mmol/L — AB (ref 3.5–5.1)
Sodium: 135 mmol/L (ref 135–145)
TOTAL PROTEIN: 7.2 g/dL (ref 6.5–8.1)

## 2016-11-07 LAB — CBC
HEMATOCRIT: 34.1 % — AB (ref 35.0–47.0)
HEMOGLOBIN: 11.1 g/dL — AB (ref 12.0–16.0)
MCH: 30.5 pg (ref 26.0–34.0)
MCHC: 32.6 g/dL (ref 32.0–36.0)
MCV: 93.4 fL (ref 80.0–100.0)
Platelets: 375 10*3/uL (ref 150–440)
RBC: 3.66 MIL/uL — ABNORMAL LOW (ref 3.80–5.20)
RDW: 13 % (ref 11.5–14.5)
WBC: 20.3 10*3/uL — ABNORMAL HIGH (ref 3.6–11.0)

## 2016-11-07 LAB — TROPONIN I: Troponin I: <0.03 ng/mL (ref <0.03)

## 2016-11-07 LAB — BRAIN NATRIURETIC PEPTIDE: B NATRIURETIC PEPTIDE 5: 131 pg/mL — AB (ref 0.0–100.0)

## 2016-11-07 MED ORDER — DEXTROSE 5 % IV SOLN: 1.0000 g | INTRAVENOUS | Status: DC

## 2016-11-07 MED ORDER — LORATADINE 10 MG PO TABS
10.0000 mg | ORAL_TABLET | Freq: Every day | ORAL | Status: DC
  Administered 2016-11-07 – 2016-11-10 (×4): 10 mg via ORAL
  Filled 2016-11-07 (×3): qty 1

## 2016-11-07 MED ORDER — ACETAMINOPHEN 500 MG PO TABS
ORAL_TABLET | ORAL | Status: AC
  Filled 2016-11-07: qty 2

## 2016-11-07 MED ORDER — SERTRALINE HCL 100 MG PO TABS
100.0000 mg | ORAL_TABLET | Freq: Every day | ORAL | Status: DC
  Administered 2016-11-07 – 2016-11-09 (×3): 100 mg via ORAL
  Filled 2016-11-07 (×3): qty 1

## 2016-11-07 MED ORDER — ALBUTEROL SULFATE (2.5 MG/3ML) 0.083% IN NEBU: 5.0000 mg | INHALATION_SOLUTION | Freq: Once | RESPIRATORY_TRACT | Status: DC

## 2016-11-07 MED ORDER — ACETAMINOPHEN 650 MG RE SUPP: 650.0000 mg | Freq: Four times a day (QID) | RECTAL | Status: DC | PRN

## 2016-11-07 MED ORDER — IPRATROPIUM BROMIDE 0.02 % IN SOLN
0.5000 mg | Freq: Once | RESPIRATORY_TRACT | Status: AC
  Administered 2016-11-07: 0.5 mg via RESPIRATORY_TRACT
  Filled 2016-11-07: qty 2.5

## 2016-11-07 MED ORDER — ENOXAPARIN SODIUM 40 MG/0.4ML ~~LOC~~ SOLN
40.0000 mg | SUBCUTANEOUS | Status: DC
  Administered 2016-11-07 – 2016-11-09 (×3): 40 mg via SUBCUTANEOUS
  Filled 2016-11-07 (×3): qty 0.4

## 2016-11-07 MED ORDER — ALBUTEROL SULFATE HFA 108 (90 BASE) MCG/ACT IN AERS: 1.0000 | INHALATION_SPRAY | Freq: Four times a day (QID) | RESPIRATORY_TRACT | Status: DC | PRN

## 2016-11-07 MED ORDER — IPRATROPIUM-ALBUTEROL 0.5-2.5 (3) MG/3ML IN SOLN
3.0000 mL | Freq: Once | RESPIRATORY_TRACT | Status: DC
Start: 2016-11-07 — End: 2016-11-07

## 2016-11-07 MED ORDER — ATORVASTATIN CALCIUM 20 MG PO TABS
10.0000 mg | ORAL_TABLET | Freq: Every day | ORAL | Status: DC
  Administered 2016-11-07 – 2016-11-09 (×3): 10 mg via ORAL
  Filled 2016-11-07 (×3): qty 1

## 2016-11-07 MED ORDER — DOCUSATE SODIUM 100 MG PO CAPS
100.0000 mg | ORAL_CAPSULE | Freq: Two times a day (BID) | ORAL | Status: DC
  Administered 2016-11-07 – 2016-11-10 (×4): 100 mg via ORAL
  Filled 2016-11-07 (×6): qty 1

## 2016-11-07 MED ORDER — BISACODYL 10 MG RE SUPP: 10.0000 mg | Freq: Every day | RECTAL | Status: DC | PRN

## 2016-11-07 MED ORDER — METHYLPREDNISOLONE SODIUM SUCC 125 MG IJ SOLR
125.0000 mg | Freq: Once | INTRAMUSCULAR | Status: AC
  Administered 2016-11-07: 125 mg via INTRAVENOUS
  Filled 2016-11-07: qty 2

## 2016-11-07 MED ORDER — ADULT MULTIVITAMIN W/MINERALS CH
1.0000 | ORAL_TABLET | Freq: Every day | ORAL | Status: DC
  Administered 2016-11-07 – 2016-11-09 (×3): 1 via ORAL
  Filled 2016-11-07 (×3): qty 1

## 2016-11-07 MED ORDER — ATORVASTATIN CALCIUM 20 MG PO TABS: 10.0000 mg | ORAL_TABLET | Freq: Every day | ORAL | Status: DC

## 2016-11-07 MED ORDER — ACETAMINOPHEN 500 MG PO TABS
1000.0000 mg | ORAL_TABLET | Freq: Four times a day (QID) | ORAL | Status: DC | PRN
  Administered 2016-11-07: 1000 mg via ORAL

## 2016-11-07 MED ORDER — SODIUM CHLORIDE 0.9 % IV BOLUS (SEPSIS)
1000.0000 mL | Freq: Once | INTRAVENOUS | Status: DC
Start: 2016-11-07 — End: 2016-11-07

## 2016-11-07 MED ORDER — ACETAMINOPHEN 325 MG PO TABS: 650.0000 mg | ORAL_TABLET | Freq: Four times a day (QID) | ORAL | Status: DC | PRN

## 2016-11-07 MED ORDER — SODIUM CHLORIDE 0.9 % IV SOLN
Freq: Once | INTRAVENOUS | Status: AC
  Administered 2016-11-07: 15:00:00 via INTRAVENOUS

## 2016-11-07 MED ORDER — ALBUTEROL SULFATE (2.5 MG/3ML) 0.083% IN NEBU
5.0000 mg | INHALATION_SOLUTION | Freq: Once | RESPIRATORY_TRACT | Status: AC
  Administered 2016-11-07: 5 mg via RESPIRATORY_TRACT
  Filled 2016-11-07: qty 6

## 2016-11-07 MED ORDER — MOMETASONE FURO-FORMOTEROL FUM 200-5 MCG/ACT IN AERO
2.0000 | INHALATION_SPRAY | Freq: Two times a day (BID) | RESPIRATORY_TRACT | Status: DC
  Administered 2016-11-07 – 2016-11-10 (×6): 2 via RESPIRATORY_TRACT
  Filled 2016-11-07: qty 8.8

## 2016-11-07 MED ORDER — CEFTRIAXONE SODIUM-DEXTROSE 1-3.74 GM-% IV SOLR
1.0000 g | INTRAVENOUS | Status: DC
  Administered 2016-11-08 – 2016-11-09 (×2): 1 g via INTRAVENOUS
  Filled 2016-11-07 (×2): qty 50

## 2016-11-07 MED ORDER — SODIUM CHLORIDE 0.9 % IV BOLUS (SEPSIS)
1000.0000 mL | Freq: Once | INTRAVENOUS | Status: AC
  Administered 2016-11-07: 1000 mL via INTRAVENOUS

## 2016-11-07 MED ORDER — DEXTROSE 5 % IV SOLN
500.0000 mg | Freq: Once | INTRAVENOUS | Status: AC
  Administered 2016-11-07: 500 mg via INTRAVENOUS
  Filled 2016-11-07: qty 500

## 2016-11-07 MED ORDER — DEXTROSE 5 % IV SOLN: 1.0000 g | Freq: Once | INTRAVENOUS | Status: DC

## 2016-11-07 MED ORDER — METHYLPREDNISOLONE SODIUM SUCC 125 MG IJ SOLR
60.0000 mg | INTRAMUSCULAR | Status: DC
  Administered 2016-11-08 – 2016-11-09 (×2): 60 mg via INTRAVENOUS
  Filled 2016-11-07 (×2): qty 2

## 2016-11-07 MED ORDER — TRAMADOL HCL 50 MG PO TABS: 50.0000 mg | ORAL_TABLET | Freq: Four times a day (QID) | ORAL | Status: DC | PRN

## 2016-11-07 MED ORDER — DEXTROSE 5 % IV SOLN
250.0000 mg | INTRAVENOUS | Status: DC
  Administered 2016-11-08: 250 mg via INTRAVENOUS
  Filled 2016-11-07: qty 250

## 2016-11-07 MED ORDER — TIOTROPIUM BROMIDE MONOHYDRATE 18 MCG IN CAPS
18.0000 ug | ORAL_CAPSULE | Freq: Every day | RESPIRATORY_TRACT | Status: DC
  Administered 2016-11-08 – 2016-11-10 (×3): 18 ug via RESPIRATORY_TRACT
  Filled 2016-11-07: qty 5

## 2016-11-07 MED ORDER — IPRATROPIUM BROMIDE 0.02 % IN SOLN: 0.5000 mg | RESPIRATORY_TRACT | Status: DC | PRN

## 2016-11-07 MED ORDER — CEFTRIAXONE SODIUM-DEXTROSE 1-3.74 GM-% IV SOLR
INTRAVENOUS | Status: AC
  Administered 2016-11-07: 1000 mg
  Filled 2016-11-07: qty 50

## 2016-11-07 MED ORDER — ALBUTEROL SULFATE (2.5 MG/3ML) 0.083% IN NEBU
2.5000 mg | INHALATION_SOLUTION | RESPIRATORY_TRACT | Status: DC | PRN
  Administered 2016-11-09: 2.5 mg via RESPIRATORY_TRACT
  Filled 2016-11-07: qty 3

## 2016-11-07 MED ORDER — ASPIRIN 81 MG PO CHEW
81.0000 mg | CHEWABLE_TABLET | Freq: Every day | ORAL | Status: DC
  Administered 2016-11-07 – 2016-11-09 (×3): 81 mg via ORAL
  Filled 2016-11-07 (×3): qty 1

## 2016-11-07 NOTE — ED Triage Notes (Signed)
Pt arrives from home via [facility] from home with reports of increased shortness of breath  Productive cough with thick green sputum  Pt with HX of COPD  ST upon arrival

## 2016-11-07 NOTE — Progress Notes (Signed)
Family Meeting Note  Advance Directive: no Today a meeting took place with the patient in the ER The following clinical team members were present during this meeting:patient The following were discussed:Patient's diagnosis: Patient has history of chronic respiratory failure with COPD comes in with pneumonia. Discussed with patient regarding her living will and CODE STATUS. Patient requests full code.   Time spent during discussion:20 mins  Lennart Gladish, MD

## 2016-11-07 NOTE — ED Notes (Addendum)
Pt returned from xray   I placed pt back on monitor

## 2016-11-07 NOTE — ED Provider Notes (Addendum)
[facility] Emergency Department Provider Note  ____________________________________________   I have reviewed the triage vital signs and the nursing notes.   HISTORY  Chief Complaint Shortness of Breath; Cough; Nasal Congestion; and COPD    HPI Hailey Gibbs is a 70 y.o. female history of ongoing tobacco abuse COPD, who presents today complaining of cough, productive, low-grade fever and feeling worse for the last week. She is on her home oxygen. She has not had a go up on her action. She denies chest pain or leg swelling. No history of CHF. Patient states that she has had no recent travel, she denies hemoptysis. Cough is productive of greenish mucus. No recent hospitalization  Past Medical History:  Diagnosis Date  . Anxiety   . Carotid artery occlusion   . COPD (chronic obstructive pulmonary disease) (HCC)   . Depression   . GERD (gastroesophageal reflux disease)   . Hyperlipidemia   . Hypertension   . Seasonal allergies   . Shortness of breath dyspnea     Patient Active Problem List   Diagnosis Date Noted  . Chronic obstructive pulmonary disease with hypoxia (HCC) 10/26/2016  . Abnormal chest sounds 08/29/2015  . Obstructive chronic bronchitis with exacerbation (HCC) 08/29/2015  . Anxiety and depression 08/29/2015  . BP (high blood pressure) 08/29/2015  . Carotid stenosis 06/23/2015  . Pre-operative cardiovascular examination 05/30/2015  . Tobacco use 05/30/2015  . Hyperlipidemia     Past Surgical History:  Procedure Laterality Date  . ENDARTERECTOMY Left 06/23/2015   Procedure: ENDARTERECTOMY CAROTID;  Surgeon: Annice NeedyJason S Dew, MD;  Location: [facility] ORS;  Service: Vascular;  Laterality: Left;  . TUBAL LIGATION    . VAGINAL HYSTERECTOMY      Prior to Admission medications   Medication Sig Start Date End Date Taking? Authorizing Provider  aspirin 81 MG chewable tablet Chew 81 mg by mouth at bedtime.    Yes Historical Provider, MD   atorvastatin (LIPITOR) 10 MG tablet TAKE 1 TABLET (10 MG TOTAL) BY MOUTH DAILY. 08/06/16  Yes Dennis E Chrismon, PA  cetirizine (ZYRTEC) 10 MG tablet Take 10 mg by mouth at bedtime.    Yes Historical Provider, MD  Multiple Vitamin (MULTIVITAMIN) tablet Take 1 tablet by mouth at bedtime.    Yes Historical Provider, MD  sertraline (ZOLOFT) 100 MG tablet 1 (ONE) TABLET, ORAL, AT BEDTIME DAILY BY MOUTH 09/24/16  Yes Jodell CiproDennis E Chrismon, PA  SPIRIVA HANDIHALER 18 MCG inhalation capsule INHALE 1 CAPSULE ONCE A DAY 02/07/16  Yes Jodell Ciproennis E Chrismon, PA  SYMBICORT 160-4.5 MCG/ACT inhaler INHALE 2 PUFFS TWICE A DAY 08/06/16  Yes Dennis E Chrismon, PA  VENTOLIN HFA 108 (90 Base) MCG/ACT inhaler INHALE 1-2 PUFFS 4 TIMES A DAY AS NEEDED FOR WHEEZING 08/09/16  Yes Dennis E Chrismon, PA  acetaminophen (TYLENOL) 500 MG tablet Take 1,000 mg by mouth every 6 (six) hours as needed for mild pain or headache.     Historical Provider, MD    Allergies Codeine  Family History  Problem Relation Age of Onset  . Heart disease Father 7975    CABG   . Hyperlipidemia Father   . Pneumonia Father   . Dementia Mother   . COPD Mother   . Healthy Sister   . Parkinson's disease Brother     Social History Social History  Substance Use Topics  . Smoking status: Current Every Day Smoker    Packs/day: 0.75    Years: 45.00    Types: Cigarettes  .  Smokeless tobacco: Never Used     Comment: pt uses nictine patches also  . Alcohol use No    Review of Systems Constitutional: No fever/chills Eyes: No visual changes. ENT: No sore throat. No stiff neck no neck pain Cardiovascular: Denies chest pain. Respiratory: Positive shortness of breath. Gastrointestinal:   no vomiting.  No diarrhea.  No constipation. Genitourinary: Negative for dysuria. Musculoskeletal: Negative lower extremity swelling Skin: Negative for rash. Neurological: Negative for severe headaches, focal weakness or numbness. 10-point ROS otherwise  negative.  ____________________________________________   PHYSICAL EXAM:  VITAL SIGNS: ED Triage Vitals  Enc Vitals Group     BP 11/07/16 1000 113/63     Pulse Rate 11/07/16 1000 (!) 107     Resp 11/07/16 1000 (!) 28     Temp 11/07/16 1006 99.2 F (37.3 C)     Temp Source 11/07/16 1006 Oral     SpO2 11/07/16 1000 93 %     Weight 11/07/16 0940 155 lb (70.3 kg)     Height 11/07/16 0940 5\' 3"  (1.6 m)     Head Circumference --      Peak Flow --      Pain Score --      Pain Loc --      Pain Edu? --      Excl. in GC? --     Constitutional: Alert and oriented. Likely ill-appearing, patient appears to be ill today but not toxic Eyes: Conjunctivae are normal. PERRL. EOMI. Head: Atraumatic. Nose: No congestion/rhinnorhea. Mouth/Throat: Mucous membranes are moist.  Oropharynx non-erythematous. Neck: No stridor.   Nontender with no meningismus Cardiovascular: Mild tachycardia, regular rhythm. Grossly normal heart sounds.  Good peripheral circulation. Respiratory: Patient with prolonged expiratory phase with diffuse wheeze in all fields occasional rhonchi no Rales no accessory muscle use however does appear to get winded when she moves in the bed. Abdominal: Soft and nontender. No distention. No guarding no rebound Back:  There is no focal tenderness or step off.  there is no midline tenderness there are no lesions noted. there is no CVA tenderness Musculoskeletal: No lower extremity tenderness, no upper extremity tenderness. No joint effusions, no DVT signs strong distal pulses no edema Neurologic:  Normal speech and language. No gross focal neurologic deficits are appreciated.  Skin:  Skin is warm, dry and intact. No rash noted. Psychiatric: Mood and affect are normal. Speech and behavior are normal.  ____________________________________________   LABS (all labs ordered are listed, but only abnormal results are displayed)  Labs Reviewed  CBC - Abnormal; Notable for the following:        Result Value   WBC 20.3 (*)    RBC 3.66 (*)    Hemoglobin 11.1 (*)    HCT 34.1 (*)    All other components within normal limits  COMPREHENSIVE METABOLIC PANEL - Abnormal; Notable for the following:    Potassium 3.3 (*)    Chloride 96 (*)    Glucose, Bld 130 (*)    ALT 13 (*)    All other components within normal limits  BRAIN NATRIURETIC PEPTIDE - Abnormal; Notable for the following:    B Natriuretic Peptide 131.0 (*)    All other components within normal limits  CULTURE, BLOOD (ROUTINE X 2)  CULTURE, BLOOD (ROUTINE X 2)  TROPONIN I   ____________________________________________  EKG  I personally interpreted any EKGs ordered by me or triage Sinus tachycardia rate 109 acute ST elevation or depression normal axis nonspecific ST changes ____________________________________________  RADIOLOGY  I reviewed any imaging ordered by me or triage that were performed during my shift and, if possible, patient and/or family made aware of any abnormal findings. ____________________________________________   PROCEDURES  Procedure(s) performed: None  Procedures  Critical Care performed: None  ____________________________________________   INITIAL IMPRESSION / ASSESSMENT AND PLAN / ED COURSE  Pertinent labs & imaging results that were available during my care of the patient were reviewed by me and considered in my medical decision making (see chart for details).  Patient with COPD presents with cough and shortness of breath. Do not feel this likely Represents ACS CHF PE or dissection. Patient's chest x-ray, white count, and findings are all consistent with a COPD flare with Possible supervening in infection. We'll treat her with Solu-Medrol, as there is been no recent hospitalization we will treat her for community-acquired pneumonia, we will give her steroids, breathing treatments and she will be admitted to the hospital.  Clinical Course     ____________________________________________   FINAL CLINICAL IMPRESSION(S) / ED DIAGNOSES  Final diagnoses:  None      This chart was dictated using voice recognition software.  Despite best efforts to proofread,  errors can occur which can change meaning.      Jeanmarie Plant, MD 11/07/16 1141    Jeanmarie Plant, MD 11/07/16 2049

## 2016-11-07 NOTE — ED Notes (Signed)
ED Provider at bedside. 

## 2016-11-07 NOTE — H&P (Signed)
[facility] [place] [facility]   PATIENT NAME: Hailey SaaJuanita Shroff    MR#:  [id number]  DATE OF BIRTH:  21-Mar-1946  DATE OF ADMISSION:  11/07/2016  PRIMARY CARE PHYSICIAN: Dortha Kernennis Chrismon, PA   REQUESTING/REFERRING PHYSICIAN:   CHIEF COMPLAINT:  Increasing shortness of breath cough with productive green phlegm for 2 days  HISTORY OF PRESENT ILLNESS:  Hailey Gibbs  is a 70 y.o. female with a known history of Chronic respiratory failure on chronic 2 L nasal canal oxygen secondary to COPD, hypertension, hyperlipidemia, depression comes to the emergency room with increasing shortness of breath cough with productive phlegm and low-grade fever. Patient is on home oxygen. She had to go up on her oxygen. No history of CHF. Denies any recent travel or any hemoptysis. Patient was found to have pneumonia. She is being admitted with sepsis. Came in with fever of 99 1 tachycardia tachypnea and elevated white count of 20,000. She received IV Rocephin and Zithromax. Blood cultures and sputum cultures have been sent from the ER.  PAST MEDICAL HISTORY:   Past Medical History:  Diagnosis Date  . Anxiety   . Carotid artery occlusion   . COPD (chronic obstructive pulmonary disease) (HCC)   . Depression   . GERD (gastroesophageal reflux disease)   . Hyperlipidemia   . Hypertension   . Seasonal allergies   . Shortness of breath dyspnea     PAST SURGICAL HISTOIRY:   Past Surgical History:  Procedure Laterality Date  . ENDARTERECTOMY Left 06/23/2015   Procedure: ENDARTERECTOMY CAROTID;  Surgeon: Annice NeedyJason S Dew, MD;  Location: [facility] ORS;  Service: Vascular;  Laterality: Left;  . TUBAL LIGATION    . VAGINAL HYSTERECTOMY      SOCIAL HISTORY:   Social History  Substance Use Topics  . Smoking status: Current Every Day Smoker    Packs/day: 0.75    Years: 45.00    Types: Cigarettes  . Smokeless tobacco: Never Used     Comment: pt uses nictine patches also  . Alcohol use  No    FAMILY HISTORY:   Family History  Problem Relation Age of Onset  . Heart disease Father 7875    CABG   . Hyperlipidemia Father   . Pneumonia Father   . Dementia Mother   . COPD Mother   . Healthy Sister   . Parkinson's disease Brother     DRUG ALLERGIES:   Allergies  Allergen Reactions  . Codeine Anxiety         REVIEW OF SYSTEMS:  Review of Systems  Constitutional: Positive for fever. Negative for chills and weight loss.  HENT: Negative for ear discharge, ear pain and nosebleeds.   Eyes: Negative for blurred vision, pain and discharge.  Respiratory: Positive for cough, sputum production and shortness of breath. Negative for wheezing and stridor.   Cardiovascular: Negative for chest pain, palpitations, orthopnea and PND.  Gastrointestinal: Negative for abdominal pain, diarrhea, nausea and vomiting.  Genitourinary: Negative for frequency and urgency.  Musculoskeletal: Negative for back pain and joint pain.  Neurological: Positive for weakness. Negative for sensory change, speech change and focal weakness.  Psychiatric/Behavioral: Negative for depression and hallucinations. The patient is not nervous/anxious.      MEDICATIONS AT HOME:   Prior to Admission medications   Medication Sig Start Date End Date Taking? Authorizing Provider  aspirin 81 MG chewable tablet Chew 81 mg by mouth at bedtime.    Yes Historical Provider, MD  atorvastatin (LIPITOR) 10  MG tablet TAKE 1 TABLET (10 MG TOTAL) BY MOUTH DAILY. 08/06/16  Yes Dennis E Chrismon, PA  cetirizine (ZYRTEC) 10 MG tablet Take 10 mg by mouth at bedtime.    Yes Historical Provider, MD  Multiple Vitamin (MULTIVITAMIN) tablet Take 1 tablet by mouth at bedtime.    Yes Historical Provider, MD  sertraline (ZOLOFT) 100 MG tablet 1 (ONE) TABLET, ORAL, AT BEDTIME DAILY BY MOUTH 09/24/16  Yes Jodell Cipro Chrismon, PA  SPIRIVA HANDIHALER 18 MCG inhalation capsule INHALE 1 CAPSULE ONCE A DAY 02/07/16  Yes Jodell Cipro Chrismon, PA   SYMBICORT 160-4.5 MCG/ACT inhaler INHALE 2 PUFFS TWICE A DAY 08/06/16  Yes Dennis E Chrismon, PA  VENTOLIN HFA 108 (90 Base) MCG/ACT inhaler INHALE 1-2 PUFFS 4 TIMES A DAY AS NEEDED FOR WHEEZING 08/09/16  Yes Dennis E Chrismon, PA  acetaminophen (TYLENOL) 500 MG tablet Take 1,000 mg by mouth every 6 (six) hours as needed for mild pain or headache.     Historical Provider, MD      VITAL SIGNS:  Blood pressure 113/63, pulse (!) 104, temperature 99.2 F (37.3 C), temperature source Oral, resp. rate (!) 28, height 5\' 3"  (1.6 m), weight 70.3 kg (155 lb), SpO2 91 %.  PHYSICAL EXAMINATION:  GENERAL:  70 y.o.-year-old patient lying in the bed with no acute distress. Thin cachectic EYES: Pupils equal, round, reactive to light and accommodation. No scleral icterus. Extraocular muscles intact.  HEENT: Head atraumatic, normocephalic. Oropharynx and nasopharynx clear.  NECK:  Supple, no jugular venous distention. No thyroid enlargement, no tenderness.  LUNGS: Distant course breath sounds bilaterally, no wheezing, rales,rhonchi or crepitation. No use of accessory muscles of respiration.  CARDIOVASCULAR: S1, S2 normal. No murmurs, rubs, or gallops. Tachycardia ABDOMEN: Soft, nontender, nondistended. Bowel sounds present. No organomegaly or mass.  EXTREMITIES: No pedal edema, cyanosis, or clubbing.  NEUROLOGIC: Cranial nerves II through XII are intact. Muscle strength 5/5 in all extremities. Sensation intact. Gait not checked. Subjective weakness PSYCHIATRIC: The patient is alert and oriented x 3.  SKIN: No obvious rash, lesion, or ulcer.   LABORATORY PANEL:   CBC  Recent Labs Lab 11/07/16 1008  WBC 20.3*  HGB 11.1*  HCT 34.1*  PLT 375   ------------------------------------------------------------------------------------------------------------------  Chemistries   Recent Labs Lab 11/07/16 1008  NA 135  K 3.3*  CL 96*  CO2 31  GLUCOSE 130*  BUN 10  CREATININE 0.52  CALCIUM 9.1  AST  16  ALT 13*  ALKPHOS 126  BILITOT 0.4   ------------------------------------------------------------------------------------------------------------------  Cardiac Enzymes  Recent Labs Lab 11/07/16 1008  TROPONINI <0.03   ------------------------------------------------------------------------------------------------------------------  RADIOLOGY:  Dg Chest 2 View  Result Date: 11/07/2016 CLINICAL DATA:  Productive cough, shortness of breath. EXAM: CHEST  2 VIEW COMPARISON:  Radiographs of August 30, 2015. FINDINGS: The heart size and mediastinal contours are within normal limits. Atherosclerosis of thoracic aorta is noted. No pneumothorax or pleural effusion is noted. Increased interstitial densities are noted throughout both lungs diffusely concerning for edema or atypical inflammation. The visualized skeletal structures are unremarkable. IMPRESSION: Aortic atherosclerosis. Increased diffuse interstitial densities throughout both lungs concerning for edema or atypical inflammation. Electronically Signed   By: Lupita Raider, M.D.   On: 11/07/2016 10:51    EKG:    IMPRESSION AND PLAN:   Mahagony Grieb  is a 71 y.o. female with a known history of Chronic respiratory failure on chronic 2 L nasal canal oxygen secondary to COPD, hypertension, hyperlipidemia, depression comes to the emergency  room with increasing shortness of breath cough with productive phlegm and low-grade fever.   1 sepsis due to bilateral pneumonia community-acquired in the setting of chronic respiratory failure/COPD -Admit to MedSurg -IV Rocephin and IV Zithromax -IV Solu-Medrol, inhalers, nebulizer, oxygen -Incentive spirometry -Follow blood cultures sputum culture  2. Leukocytosis due to 1  3. Acute on chronic respiratory failure secondary to COPD exacerbation in the setting of pneumonia -Treatment as above  4. Hypertension continue home meds  5. Tobacco abuse smoking cessation advised 3 minutes  spent  No family in the ER.   All the records are reviewed and case discussed with ED provider. Management plans discussed with the patient, family and they are in agreement.  CODE STATUS: Full code discussed with patient TOTAL TIME TAKING CARE OF THIS PATIENT: 50 minutes.    Jerre Vandrunen M.D on 11/07/2016 at 12:01 PM  Between 7am to 6pm - Pager - [phone]  After 6pm go to [email or web address] - password [email or web address] [email or web address]  [facility]  Office  [phone]  CC: Primary care physician; Dortha Kernennis Chrismon, PA

## 2016-11-08 LAB — CBC
HCT: 33 % — ABNORMAL LOW (ref 35.0–47.0)
Hemoglobin: 10.9 g/dL — ABNORMAL LOW (ref 12.0–16.0)
MCH: 31.5 pg (ref 26.0–34.0)
MCHC: 33.2 g/dL (ref 32.0–36.0)
MCV: 95.1 fL (ref 80.0–100.0)
PLATELETS: 399 10*3/uL (ref 150–440)
RBC: 3.47 MIL/uL — ABNORMAL LOW (ref 3.80–5.20)
RDW: 13.3 % (ref 11.5–14.5)
WBC: 11.7 10*3/uL — AB (ref 3.6–11.0)

## 2016-11-08 LAB — EXPECTORATED SPUTUM ASSESSMENT W REFEX TO RESP CULTURE

## 2016-11-08 LAB — EXPECTORATED SPUTUM ASSESSMENT W GRAM STAIN, RFLX TO RESP C

## 2016-11-08 LAB — MAGNESIUM: Magnesium: 2.1 mg/dL (ref 1.7–2.4)

## 2016-11-08 MED ORDER — POTASSIUM CHLORIDE CRYS ER 20 MEQ PO TBCR
40.0000 meq | EXTENDED_RELEASE_TABLET | Freq: Once | ORAL | Status: AC
  Administered 2016-11-08: 40 meq via ORAL
  Filled 2016-11-08: qty 2

## 2016-11-08 MED ORDER — AZITHROMYCIN 250 MG PO TABS
250.0000 mg | ORAL_TABLET | Freq: Every day | ORAL | Status: DC
  Administered 2016-11-09 – 2016-11-10 (×2): 250 mg via ORAL
  Filled 2016-11-08 (×2): qty 1

## 2016-11-08 NOTE — Evaluation (Signed)
Physical Therapy Evaluation Patient Details Name: Hailey Gibbs MRN: [id number] DOB: 1946-10-20 Today's Date: 11/08/2016   History of Present Illness  Pt is a 70 y.o. female presenting to hospital with low grade fever, cough, SOB, and productive green phlegm.  Pt admitted with sepsis d/t B PNA (community acquired) in setting of chronic respiratory failure/COPD.  PMH includes COPD, chronic respiratory failure on 2 L  at home, htn, anxiety, and h/o L CEA.  Clinical Impression  Prior to hospital admission, pt was independent with functional mobility and utilized 2 L home O2 baseline.  Pt lives alone in 1 level home with stairs to enter.  Currently pt is CGA ambulating around nursing loop without AD; pt's O2 desaturated from 93% at rest to 81% post ambulation all on 3 L/min O2 via nasal cannula (nursing notified).  O2 increased back to 92% within a few minutes of purse lip breathing.  Pt would benefit from skilled PT to address noted impairments and functional limitations.  Recommend pt discharge to home when medically appropriate.    Follow Up Recommendations No PT follow up    Equipment Recommendations  None recommended by PT    Recommendations for Other Services       Precautions / Restrictions Precautions Precautions: Fall Restrictions Weight Bearing Restrictions: No      Mobility  Bed Mobility Overal bed mobility: Modified Independent             General bed mobility comments: Supine to/from sit with HOB elevated  Transfers Overall transfer level: Independent Equipment used: None Transfers: Sit to/from Ray[employer]onStand;Stand Pivot Transfers Sit to Stand: Independent Stand pivot transfers: Independent (toilet transfer)       General transfer comment: steady without loss of balance  Ambulation/Gait Ambulation/Gait assistance: Min guard Ambulation Distance (Feet): 200 Feet Assistive device:  (pt pushing O2 tank (pt preferring to walk this way)) Gait Pattern/deviations:  Step-through pattern   Gait velocity interpretation: at or above normal speed for age/gender General Gait Details: steady without loss of balance; mild SOB noted with distance  Stairs            Wheelchair Mobility    Modified Rankin (Stroke Patients Only)       Balance Overall balance assessment: No apparent balance deficits (not formally assessed)                                           Pertinent Vitals/Pain Pain Assessment: No/denies pain    Home Living Family/patient expects to be discharged to:: Private residence Living Arrangements: Alone   Type of Home: House Home Access: Stairs to enter Entrance Stairs-Rails: None Entrance Stairs-Number of Steps: 2 Home Layout: One level Home Equipment: None      Prior Function Level of Independence: Independent with assistive device(s)         Comments: Pt utilizes 2 L home O2.     Hand Dominance        Extremity/Trunk Assessment   Upper Extremity Assessment: Overall WFL for tasks assessed           Lower Extremity Assessment: Overall WFL for tasks assessed         Communication   Communication: No difficulties  Cognition Arousal/Alertness: Awake/alert Behavior During Therapy: WFL for tasks assessed/performed Overall Cognitive Status: Within Functional Limits for tasks assessed  General Comments   Nursing cleared pt for participation in physical therapy.  Pt agreeable to PT session.    Exercises  Pt required vc's for purse lip breathing during activity and post activity and also education on modifying activity based on pulmonary tolerance (monitoring SOB and O2 saturations).   Assessment/Plan    PT Assessment Patient needs continued PT services  PT Problem List Cardiopulmonary status limiting activity          PT Treatment Interventions Therapeutic activities;Therapeutic exercise;Patient/family education    PT Goals (Current goals can be  found in the Care Plan section)  Acute Rehab PT Goals Patient Stated Goal: to go home PT Goal Formulation: With patient Time For Goal Achievement: 11/22/16 Potential to Achieve Goals: Good    Frequency Min 2X/week   Barriers to discharge        Co-evaluation               End of Session Equipment Utilized During Treatment: Gait belt;Oxygen (3 L/min O2 via nasal cannula) Activity Tolerance: Other (comment) (Tolerated well except for O2 desaturation with activity) Patient left: in bed;with call bell/phone within reach (pt moderate fall risk score of 5) Nurse Communication: Mobility status;Precautions (O2 desaturation with activity)         Time: [id number] PT Time Calculation (min) (ACUTE ONLY): 20 min   Charges:   PT Evaluation $PT Eval Low Complexity: 1 Procedure PT Treatments $Therapeutic Exercise: 8-22 mins   PT G CodesHendricks Limes:        Atthew Coutant 11/08/2016, 3:48 PM Hendricks LimesEmily Tiah Heckel, PT [phone]

## 2016-11-08 NOTE — Progress Notes (Signed)
[facility] - [place] at [facility]   PATIENT NAME: Hailey Gibbs    MR#:  [id number]  DATE OF BIRTH:  1946-04-25  SUBJECTIVE:  CHIEF COMPLAINT:   Chief Complaint  Patient presents with  . Shortness of Breath  . Cough  . Nasal Congestion  . COPD   Cough, better wheezing and shortness of breath. REVIEW OF SYSTEMS:  Review of Systems  Constitutional: Positive for malaise/fatigue. Negative for chills and fever.  HENT: Negative for congestion.   Eyes: Negative for blurred vision and double vision.  Respiratory: Positive for cough, sputum production, shortness of breath and wheezing. Negative for hemoptysis and stridor.   Cardiovascular: Negative for chest pain and leg swelling.  Gastrointestinal: Negative for abdominal pain, blood in stool, diarrhea, melena, nausea and vomiting.  Genitourinary: Negative for dysuria and hematuria.  Musculoskeletal: Negative for back pain and joint pain.  Skin: Negative for itching and rash.  Neurological: Negative for dizziness, focal weakness and loss of consciousness.  Psychiatric/Behavioral: Negative for depression. The patient is not nervous/anxious.     DRUG ALLERGIES:   Allergies  Allergen Reactions  . Codeine Anxiety        VITALS:  Blood pressure 121/65, pulse (!) 106, temperature 98.3 F (36.8 C), temperature source Oral, resp. rate 18, height  (1.626 m), weight 145 lb 12.8 oz (66.1 kg), SpO2 91 %. PHYSICAL EXAMINATION:  Physical Exam  Constitutional: She is oriented to person, place, and time and well-developed, well-nourished, and in no distress.  HENT:  Head: Normocephalic.  Mouth/Throat: Oropharynx is clear and moist.  Eyes: Conjunctivae and EOM are normal.  Neck: Normal range of motion. Neck supple. No JVD present. No tracheal deviation present. No thyromegaly present.  Cardiovascular: Normal rate, regular rhythm and normal heart sounds.  Exam reveals no gallop.   No murmur  heard. Pulmonary/Chest: Effort normal. No respiratory distress. She has wheezes. She has no rales. She exhibits no tenderness.  Abdominal: Soft. Bowel sounds are normal. She exhibits no distension. There is no tenderness.  Musculoskeletal: Normal range of motion. She exhibits no edema or tenderness.  Neurological: She is alert and oriented to person, place, and time. No cranial nerve deficit.  Skin: No rash noted. No erythema.  Psychiatric: Affect and judgment normal.   LABORATORY PANEL:   CBC  Recent Labs Lab 11/08/16 0539  WBC 11.7*  HGB 10.9*  HCT 33.0*  PLT 399   ------------------------------------------------------------------------------------------------------------------ Chemistries   Recent Labs Lab 11/07/16 1008 11/08/16 0539  NA 135  --   K 3.3*  --   CL 96*  --   CO2 31  --   GLUCOSE 130*  --   BUN 10  --   CREATININE 0.52  --   CALCIUM 9.1  --   MG  --  2.1  AST 16  --   ALT 13*  --   ALKPHOS 126  --   BILITOT 0.4  --    RADIOLOGY:  No results found. ASSESSMENT AND PLAN:   Hailey Gibbs  is a 70 y.o. female with a known history of Chronic respiratory failure on chronic 2 L nasal canal oxygen secondary to COPD, hypertension, hyperlipidemia, depression comes to the emergency room with increasing shortness of breath cough with productive phlegm and low-grade fever.   1 sepsis due to bilateral pneumonia community-acquired in the setting of chronic respiratory failure/COPD Continue IV Rocephin and IV Zithromax Taper Solu-Medrol, continue inhalers, nebulizer, oxygen -Incentive spirometry -Follow blood cultures sputum culture  2. Leukocytosis due to 1, improving.  3. Acute on chronic respiratory failure secondary to COPD exacerbation in the setting of pneumonia -Treatment as above  4. Hypertension continue home meds  5. Tobacco abuse smoking cessation advised 3 minutes spent  Hypokalemia. Given potassium supplement, magnesium level is  normal.  All the records are reviewed and case discussed with Care Management/Social Worker. Management plans discussed with the patient, her husband and they are in agreement.  CODE STATUS: Full code  TOTAL TIME TAKING CARE OF THIS PATIENT: 38 minutes.   More than 50% of the time was spent in counseling/coordination of care: YES  POSSIBLE D/C IN 2 DAYS, DEPENDING ON CLINICAL CONDITION.   Hailey Pollackhen, Hailey Gibbs M.D on 11/08/2016 at 3:31 PM  Between 7am to 6pm - Pager - [phone]  After 6pm go to [email or web address] - Social research officer, governmentpassword [email or web address]  [facility] [place] [facility]  Office  [phone]  CC: Primary care physician; Dortha Kernennis Chrismon, PA  Note: This dictation was prepared with Dragon dictation along with smaller phrase technology. Any transcriptional errors that result from this process are unintentional.

## 2016-11-09 MED ORDER — PREDNISONE 20 MG PO TABS
50.0000 mg | ORAL_TABLET | Freq: Every day | ORAL | Status: DC
  Administered 2016-11-10: 50 mg via ORAL
  Filled 2016-11-09: qty 2

## 2016-11-09 MED ORDER — GUAIFENESIN-DM 100-10 MG/5ML PO SYRP: 5.0000 mL | ORAL_SOLUTION | ORAL | Status: DC | PRN

## 2016-11-09 MED ORDER — AMLODIPINE BESYLATE 10 MG PO TABS
10.0000 mg | ORAL_TABLET | Freq: Every day | ORAL | Status: DC
  Administered 2016-11-10: 10 mg via ORAL
  Filled 2016-11-09: qty 1

## 2016-11-09 MED ORDER — PHENOL 1.4 % MT LIQD
1.0000 | OROMUCOSAL | Status: DC | PRN
Start: 2016-11-09 — End: 2016-11-10
  Filled 2016-11-09: qty 177

## 2016-11-09 MED ORDER — HYDRALAZINE HCL 20 MG/ML IJ SOLN: 10.0000 mg | Freq: Four times a day (QID) | INTRAMUSCULAR | Status: DC | PRN

## 2016-11-09 NOTE — Progress Notes (Signed)
[facility] - [place] at [facility]   PATIENT NAME: Hailey Gibbs    MR#:  [id number]  DATE OF BIRTH:  11-22-1946  SUBJECTIVE:  CHIEF COMPLAINT:   Chief Complaint  Patient presents with  . Shortness of Breath  . Cough  . Nasal Congestion  . COPD   Better cough and shortness of breath. No wheezing. On O2 [place] 3 L. REVIEW OF SYSTEMS:  Review of Systems  Constitutional: Positive for malaise/fatigue. Negative for chills and fever.  HENT: Negative for congestion.   Eyes: Negative for blurred vision and double vision.  Respiratory: Positive for cough, sputum production and shortness of breath. Negative for hemoptysis, wheezing and stridor.   Cardiovascular: Negative for chest pain and leg swelling.  Gastrointestinal: Negative for abdominal pain, blood in stool, diarrhea, melena, nausea and vomiting.  Genitourinary: Negative for dysuria and hematuria.  Musculoskeletal: Negative for back pain and joint pain.  Skin: Negative for itching and rash.  Neurological: Negative for dizziness, focal weakness and loss of consciousness.  Psychiatric/Behavioral: Negative for depression. The patient is not nervous/anxious.     DRUG ALLERGIES:   Allergies  Allergen Reactions  . Codeine Anxiety        VITALS:  Blood pressure (!) 160/67, pulse (!) 103, temperature 98.1 F (36.7 C), temperature source Oral, resp. rate 18, height 5\' 4"  (1.626 m), weight 145 lb 12.8 oz (66.1 kg), SpO2 91 %. PHYSICAL EXAMINATION:  Physical Exam  Constitutional: She is oriented to person, place, and time and well-developed, well-nourished, and in no distress.  HENT:  Head: Normocephalic.  Mouth/Throat: Oropharynx is clear and moist.  Eyes: Conjunctivae and EOM are normal.  Neck: Normal range of motion. Neck supple. No JVD present. No tracheal deviation present. No thyromegaly present.  Cardiovascular: Normal rate, regular rhythm and normal heart sounds.  Exam reveals no gallop.   No murmur  heard. Pulmonary/Chest: Effort normal. No respiratory distress. She has no wheezes. She has no rales. She exhibits no tenderness.  Diminished lung sounds.  Abdominal: Soft. Bowel sounds are normal. She exhibits no distension. There is no tenderness.  Musculoskeletal: Normal range of motion. She exhibits no edema or tenderness.  Neurological: She is alert and oriented to person, place, and time. No cranial nerve deficit.  Skin: No rash noted. No erythema.  Psychiatric: Affect and judgment normal.   LABORATORY PANEL:   CBC  Recent Labs Lab 11/08/16 0539  WBC 11.7*  HGB 10.9*  HCT 33.0*  PLT 399   ------------------------------------------------------------------------------------------------------------------ Chemistries   Recent Labs Lab 11/07/16 1008 11/08/16 0539  NA 135  --   K 3.3*  --   CL 96*  --   CO2 31  --   GLUCOSE 130*  --   BUN 10  --   CREATININE 0.52  --   CALCIUM 9.1  --   MG  --  2.1  AST 16  --   ALT 13*  --   ALKPHOS 126  --   BILITOT 0.4  --    RADIOLOGY:  No results found. ASSESSMENT AND PLAN:   Hailey Gibbs  is a 70 y.o. female with a known history of Chronic respiratory failure on chronic 2 L nasal canal oxygen secondary to COPD, hypertension, hyperlipidemia, depression comes to the emergency room with increasing shortness of breath cough with productive phlegm and low-grade fever.   1 sepsis due to bilateral pneumonia community-acquired in the setting of chronic respiratory failure/COPD Discontinue IV Rocephin and change IV Zithromax  to by mouth. Discontinued Solu-Medrol, start and taper prednisone, continue inhalers, nebulizer, oxygen -Incentive spirometry -Follow blood cultures sputum culture  2. Leukocytosis due to 1, improving.  3. Acute on chronic respiratory failure secondary to COPD exacerbation in the setting of pneumonia -Treatment as above  4. Hypertension. Norvasc. Hydralazine IV when necessary.  5. Tobacco abuse  smoking cessation advised 3 minutes spent  Hypokalemia. Given potassium supplement, magnesium level is normal.  All the records are reviewed and case discussed with Care Management/Social Worker. Management plans discussed with the patient, her husband and they are in agreement.  CODE STATUS: Full code  TOTAL TIME TAKING CARE OF THIS PATIENT: 36 minutes.   More than 50% of the time was spent in counseling/coordination of care: YES  POSSIBLE D/C IN 2 DAYS, DEPENDING ON CLINICAL CONDITION.   Shaune Pollackhen, Dea Bitting M.D on 11/09/2016 at 5:06 PM  Between 7am to 6pm - Pager - [phone]  After 6pm go to [email or web address] - Social research officer, governmentpassword [email or web address]  [facility] [place] [facility]  Office  [phone]  CC: Primary care physician; Dortha Kernennis Chrismon, PA  Note: This dictation was prepared with Dragon dictation along with smaller phrase technology. Any transcriptional errors that result from this process are unintentional.

## 2016-11-10 LAB — CULTURE, RESPIRATORY W GRAM STAIN: Culture: NORMAL

## 2016-11-10 LAB — CULTURE, RESPIRATORY

## 2016-11-10 MED ORDER — METOPROLOL TARTRATE 25 MG PO TABS
25.0000 mg | ORAL_TABLET | Freq: Two times a day (BID) | ORAL | Status: DC
  Administered 2016-11-10: 25 mg via ORAL
  Filled 2016-11-10: qty 1

## 2016-11-10 MED ORDER — AZITHROMYCIN 250 MG PO TABS: 250.0000 mg | ORAL_TABLET | Freq: Every day | ORAL | 0 refills | Status: DC

## 2016-11-10 MED ORDER — PREDNISONE 10 MG PO TABS
ORAL_TABLET | ORAL | 0 refills | Status: DC
Start: 2016-11-10 — End: 2016-11-20

## 2016-11-10 MED ORDER — METOPROLOL TARTRATE 25 MG PO TABS: 25.0000 mg | ORAL_TABLET | Freq: Two times a day (BID) | ORAL | 0 refills | Status: DC

## 2016-11-10 MED ORDER — GUAIFENESIN-DM 100-10 MG/5ML PO SYRP
5.0000 mL | ORAL_SOLUTION | ORAL | 0 refills | Status: DC | PRN
Start: 2016-11-10 — End: 2017-04-12

## 2016-11-10 MED ORDER — AMLODIPINE BESYLATE 10 MG PO TABS: 10.0000 mg | ORAL_TABLET | Freq: Every day | ORAL | 2 refills | Status: DC

## 2016-11-10 NOTE — Discharge Instructions (Signed)
Heart healthy diet. °Continue home O2 [place]  2L. °

## 2016-11-10 NOTE — Discharge Summary (Signed)
[facility] - [place] at [facility]   PATIENT NAME: Hailey Gibbs    MR#:  [id number]  DATE OF BIRTH:  1946-04-14  DATE OF ADMISSION:  11/07/2016   ADMITTING PHYSICIAN: Enedina Finner, MD  DATE OF DISCHARGE: 11/10/2016 12:29 PM  PRIMARY CARE PHYSICIAN: Dortha Kern, PA   ADMISSION DIAGNOSIS:  sick DISCHARGE DIAGNOSIS:  Active Problems:   Sepsis (HCC) sepsis due to bilateral pneumonia  Acute on chronic respiratory failure secondary to COPD exacerbation in the setting of pneumonia SECONDARY DIAGNOSIS:   Past Medical History:  Diagnosis Date  . Anxiety   . Carotid artery occlusion   . COPD (chronic obstructive pulmonary disease) (HCC)   . Depression   . GERD (gastroesophageal reflux disease)   . Hyperlipidemia   . Hypertension   . Seasonal allergies   . Shortness of breath dyspnea    HOSPITAL COURSE:   Hailey Gibbs a 70 y.o. femalewith a known history of Chronic respiratory failure on chronic 2 L nasal canal oxygen secondary to COPD, hypertension, hyperlipidemia, depression comes to the emergency room with increasing shortness of breath cough with productive phlegm and low-grade fever.   1 sepsis due to bilateral pneumonia community-acquired in the setting of chronic respiratory failure/COPD Discontinued IV Rocephin and changed  IVZithromax to by mouth. Discontinued Solu-Medrol, started and tapered prednisone, continue inhalers, nebulizer, oxygen -Incentive spirometry negative blood cultures sputum culture  2. Leukocytosis due to 1, improving.  3. Acute on chronic respiratory failure secondary to COPD exacerbation in the setting of pneumonia -Treatment as above  4. Hypertension. Norvasc. Hydralazine IV when necessary.  5. Tobacco abuse smoking cessation advised 3 minutes spent  Hypokalemia. Given potassium supplement, magnesium level is normal.   DISCHARGE CONDITIONS:  Stable, discharged to home today. CONSULTS OBTAINED:    DRUG ALLERGIES:   Allergies  Allergen Reactions  . Codeine Anxiety        DISCHARGE MEDICATIONS:     Medication List    TAKE these medications   acetaminophen 500 MG tablet Commonly known as:  TYLENOL Take 1,000 mg by mouth every 6 (six) hours as needed for mild pain or headache.   amLODipine 10 MG tablet Commonly known as:  NORVASC Take 1 tablet (10 mg total) by mouth daily.   aspirin 81 MG chewable tablet Chew 81 mg by mouth at bedtime.   atorvastatin 10 MG tablet Commonly known as:  LIPITOR TAKE 1 TABLET (10 MG TOTAL) BY MOUTH DAILY.   azithromycin 250 MG tablet Commonly known as:  ZITHROMAX Take 1 tablet (250 mg total) by mouth daily.   cetirizine 10 MG tablet Commonly known as:  ZYRTEC Take 10 mg by mouth at bedtime.   guaiFENesin-dextromethorphan 100-10 MG/5ML syrup Commonly known as:  ROBITUSSIN DM Take 5 mLs by mouth every 4 (four) hours as needed for cough (chest congestion).   metoprolol tartrate 25 MG tablet Commonly known as:  LOPRESSOR Take 1 tablet (25 mg total) by mouth 2 (two) times daily.   multivitamin tablet Take 1 tablet by mouth at bedtime.   predniSONE 10 MG tablet Commonly known as:  DELTASONE 40 mg po daily for 2 days, 20 mg po daily for 2 days, 10 mg po daily for 3 days.   sertraline 100 MG tablet Commonly known as:  ZOLOFT 1 (ONE) TABLET, ORAL, AT BEDTIME DAILY BY MOUTH   SPIRIVA HANDIHALER 18 MCG inhalation capsule Generic drug:  tiotropium INHALE 1 CAPSULE ONCE A DAY   SYMBICORT 160-4.5 MCG/ACT inhaler Generic  drug:  budesonide-formoterol INHALE 2 PUFFS TWICE A DAY   VENTOLIN HFA 108 (90 Base) MCG/ACT inhaler Generic drug:  albuterol INHALE 1-2 PUFFS 4 TIMES A DAY AS NEEDED FOR WHEEZING        DISCHARGE INSTRUCTIONS:  See AVS. OXYGEN:  Home Oxygen: 2L.   If you experience worsening of your admission symptoms, develop shortness of breath, life threatening emergency, suicidal or homicidal thoughts you must seek  medical attention immediately by calling 911 or calling your MD immediately  if symptoms less severe.  You Must read complete instructions/literature along with all the possible adverse reactions/side effects for all the Medicines you take and that have been prescribed to you. Take any new Medicines after you have completely understood and accpet all the possible adverse reactions/side effects.   Please note  You were cared for by a hospitalist during your hospital stay. If you have any questions about your discharge medications or the care you received while you were in the hospital after you are discharged, you can call the unit and asked to speak with the hospitalist on call if the hospitalist that took care of you is not available. Once you are discharged, your primary care physician will handle any further medical issues. Please note that NO REFILLS for any discharge medications will be authorized once you are discharged, as it is imperative that you return to your primary care physician (or establish a relationship with a primary care physician if you do not have one) for your aftercare needs so that they can reassess your need for medications and monitor your lab values.    On the day of Discharge:  VITAL SIGNS:  Blood pressure (!) 153/62, pulse 99, temperature 98.7 F (37.1 C), temperature source Oral, resp. rate 20, height 5\' 4"  (1.626 m), weight 145 lb 12.8 oz (66.1 kg), SpO2 93 %. PHYSICAL EXAMINATION:  GENERAL:  70 y.o.-year-old patient lying in the bed with no acute distress.  EYES: Pupils equal, round, reactive to light and accommodation. No scleral icterus. Extraocular muscles intact.  HEENT: Head atraumatic, normocephalic. Oropharynx and nasopharynx clear.  NECK:  Supple, no jugular venous distention. No thyroid enlargement, no tenderness.  LUNGS: Normal breath sounds bilaterally, no wheezing, rales,rhonchi or crepitation. No use of accessory muscles of respiration.    CARDIOVASCULAR: S1, S2 normal. No murmurs, rubs, or gallops.  ABDOMEN: Soft, non-tender, non-distended. Bowel sounds present. No organomegaly or mass.  EXTREMITIES: No pedal edema, cyanosis, or clubbing.  NEUROLOGIC: Cranial nerves II through XII are intact. Muscle strength 5/5 in all extremities. Sensation intact. Gait not checked.  PSYCHIATRIC: The patient is alert and oriented x 3.  SKIN: No obvious rash, lesion, or ulcer.  DATA REVIEW:   CBC  Recent Labs Lab 11/08/16 0539  WBC 11.7*  HGB 10.9*  HCT 33.0*  PLT 399    Chemistries   Recent Labs Lab 11/07/16 1008 11/08/16 0539  NA 135  --   K 3.3*  --   CL 96*  --   CO2 31  --   GLUCOSE 130*  --   BUN 10  --   CREATININE 0.52  --   CALCIUM 9.1  --   MG  --  2.1  AST 16  --   ALT 13*  --   ALKPHOS 126  --   BILITOT 0.4  --      Microbiology Results  Results for orders placed or performed during the hospital encounter of 11/07/16  Culture, blood (routine x  2)     Status: None (Preliminary result)   Collection Time: 11/07/16 11:08 AM  Result Value Ref Range Status   Specimen Description BLOOD RIGHT FATTY CASTS  Final   Special Requests   Final    BOTTLES DRAWN AEROBIC AND ANAEROBIC 11CCAERO,10CCAA   Culture NO GROWTH 3 DAYS  Final   Report Status PENDING  Incomplete  Culture, blood (routine x 2)     Status: None (Preliminary result)   Collection Time: 11/07/16 11:08 AM  Result Value Ref Range Status   Specimen Description BLOOD LEFT ASSIST CONTROL  Final   Special Requests   Final    BOTTLES DRAWN AEROBIC AND ANAEROBIC 15CCAERO,14CCANA   Culture NO GROWTH 3 DAYS  Final   Report Status PENDING  Incomplete  Culture, sputum-assessment     Status: None   Collection Time: 11/08/16  5:26 AM  Result Value Ref Range Status   Specimen Description SPUTUM  Final   Special Requests NONE  Final   Sputum evaluation THIS SPECIMEN IS ACCEPTABLE FOR SPUTUM CULTURE  Final   Report Status 11/08/2016 FINAL  Final   Culture, respiratory (NON-Expectorated)     Status: None   Collection Time: 11/08/16  5:26 AM  Result Value Ref Range Status   Specimen Description SPUTUM  Final   Special Requests NONE Reflexed from [id number]  Final   Gram Stain   Final    MODERATE WBC PRESENT, PREDOMINANTLY PMN NO ORGANISMS SEEN    Culture   Final    Consistent with normal respiratory flora. Performed at [facility]    Report Status 11/10/2016 FINAL  Final    RADIOLOGY:  No results found.   Management plans discussed with the patient, family and they are in agreement.  CODE STATUS:  Code Status History    Date Active Date Inactive Code Status Order ID Comments User Context   11/07/2016  2:27 PM 11/10/2016  3:35 PM Full Code [id number]  Enedina FinnerSona Patel, MD Inpatient   06/23/2015  5:59 PM 06/24/2015  8:36 PM Full Code [id number]  Annice NeedyJason S Dew, MD Inpatient      TOTAL TIME TAKING CARE OF THIS PATIENT: 33 minutes.    Shaune Pollackhen, Khamia Stambaugh M.D on 11/10/2016 at 5:29 PM  Between 7am to 6pm - Pager - [phone]  After 6pm go to [email or web address] - Social research officer, governmentpassword [email or web address]  [facility] [place] [facility]  Office  [phone]  CC: Primary care physician; Dortha Kernennis Chrismon, PA   Note: This dictation was prepared with Dragon dictation along with smaller phrase technology. Any transcriptional errors that result from this process are unintentional.

## 2016-11-10 NOTE — Progress Notes (Addendum)
Patient discharged to home as ordered. Patient uses oxygen at home an her family will bring her tank for the ride home. Patient has aa follow up appointment with Dortha Kernennis Chrismon PA Jan 19th at 230 pm, follow up with Dr Wyn Quakerew February 08 2017 at 1030, Carotid appointment March 2 at 0945 AM. Patient did not received any immunizations this admission, Prescriptions given to patient as ordered.

## 2016-11-12 ENCOUNTER — Telehealth: Payer: Self-pay

## 2016-11-12 LAB — CULTURE, BLOOD (ROUTINE X 2)
Culture: NO GROWTH
Culture: NO GROWTH

## 2016-11-12 NOTE — Telephone Encounter (Signed)
-----   Message from Tamsen Roersennis E Chrismon, [place] sent at 11/12/2016  9:02 AM EST ----- Blood cultures are negative. Continue treatment as outlined at hospital discharge.

## 2016-11-12 NOTE — Telephone Encounter (Signed)
Patient advised. Patient discharged from hospital on 11/10/2016. Patient reports she feels much better and is doing okay. She states she started a new BP med becausec her BP was elevated while in the hospital, but she is checking BP at home and getting lower reading. Patient will keep record to bring to hospital follow up scheduled for 11/20/2016.

## 2016-11-20 ENCOUNTER — Encounter: Payer: Self-pay | Admitting: Family Medicine

## 2016-11-20 ENCOUNTER — Ambulatory Visit (INDEPENDENT_AMBULATORY_CARE_PROVIDER_SITE_OTHER): Payer: Commercial Managed Care - HMO | Admitting: Family Medicine

## 2016-11-20 VITALS — BP 118/82 | HR 87 | Temp 97.8°F | Resp 18 | Wt 147.8 lb

## 2016-11-20 DIAGNOSIS — J9621 Acute and chronic respiratory failure with hypoxia: Secondary | ICD-10-CM | POA: Diagnosis not present

## 2016-11-20 DIAGNOSIS — I10 Essential (primary) hypertension: Secondary | ICD-10-CM

## 2016-11-20 DIAGNOSIS — J180 Bronchopneumonia, unspecified organism: Secondary | ICD-10-CM

## 2016-11-20 DIAGNOSIS — E876 Hypokalemia: Secondary | ICD-10-CM | POA: Diagnosis not present

## 2016-11-20 NOTE — Progress Notes (Signed)
Patient: Hailey Gibbs Female    DOB: Jul 05, 1946   70 y.o.   MRN: [id number] Visit Date: 11/20/2016  Today's Provider: Dortha Kernennis Chrismon, PA   Chief Complaint  Patient presents with  . Hospitalization Follow-up   Subjective:    HPI  Follow up Hospitalization  Patient was [facility] on 11/07/2016 and discharged on 11/10/2016.  She was treated for sepsis due to bilateral pneumonia community-acquired in the setting of chronic respiratory failure/COPD, Acute on chronic respiratory failure secondary to COPD exacerbation in the setting of pneumonia    Treatment for this included started amLODipine 10 MG tablet azithromycin 250 MG tablet, guaiFENesin-dextromethorphan 100-10 MG/5ML syrup ,metoprolol tartrate 25 MG tablet ,predniSONE 10 MG tablet   She reports good compliance with treatment.  She reports this condition is Improved.   ------------------------------------------------------------------------------------      Previous Medications   ACETAMINOPHEN (TYLENOL) 500 MG TABLET    Take 1,000 mg by mouth every 6 (six) hours as needed for mild pain or headache.    AMLODIPINE (NORVASC) 10 MG TABLET    Take 1 tablet (10 mg total) by mouth daily.   ASPIRIN 81 MG CHEWABLE TABLET    Chew 81 mg by mouth at bedtime.    ATORVASTATIN (LIPITOR) 10 MG TABLET    TAKE 1 TABLET (10 MG TOTAL) BY MOUTH DAILY.   CETIRIZINE (ZYRTEC) 10 MG TABLET    Take 10 mg by mouth at bedtime.    GUAIFENESIN-DEXTROMETHORPHAN (ROBITUSSIN DM) 100-10 MG/5ML SYRUP    Take 5 mLs by mouth every 4 (four) hours as needed for cough (chest congestion).   METOPROLOL TARTRATE (LOPRESSOR) 25 MG TABLET    Take 1 tablet (25 mg total) by mouth 2 (two) times daily.   MULTIPLE VITAMIN (MULTIVITAMIN) TABLET    Take 1 tablet by mouth at bedtime.    SERTRALINE (ZOLOFT) 100 MG TABLET    1 (ONE) TABLET, ORAL, AT BEDTIME DAILY BY MOUTH   SPIRIVA HANDIHALER 18 MCG INHALATION CAPSULE    INHALE 1 CAPSULE ONCE A DAY   SYMBICORT  160-4.5 MCG/ACT INHALER    INHALE 2 PUFFS TWICE A DAY   VENTOLIN HFA 108 (90 BASE) MCG/ACT INHALER    INHALE 1-2 PUFFS 4 TIMES A DAY AS NEEDED FOR WHEEZING    Review of Systems  Constitutional: Negative.   Respiratory: Positive for cough.   Cardiovascular: Negative.     Social History  Substance Use Topics  . Smoking status: Current Every Day Smoker    Packs/day: 0.75    Years: 45.00    Types: Cigarettes  . Smokeless tobacco: Never Used     Comment: pt uses nictine patches also  . Alcohol use No   Objective:   BP 118/82 (BP Location: Right Arm, Patient Position: Sitting, Cuff Size: Normal)   Pulse 87   Temp 97.8 F (36.6 C) (Oral)   Resp 16   Wt 147 lb 12.8 oz (67 kg)   SpO2 97%   BMI 25.37 kg/m   Physical Exam  Constitutional: She appears well-developed and well-nourished.  HENT:  Head: Normocephalic.  Right Ear: External ear normal.  Left Ear: External ear normal.  Mouth/Throat: Oropharynx is clear and moist.  Eyes: Conjunctivae are normal.  Neck: Neck supple.  Cardiovascular: Normal rate and regular rhythm.   Pulmonary/Chest:  No wheezing, rales or chest pains. Slightly labored breathing. Still using Oxygen by nasal cannula at 2 LPM.  Abdominal: Soft. Bowel sounds are normal.  Lymphadenopathy:  She has no cervical adenopathy.      Assessment & Plan:     1. Diffuse pneumonia Hospitalized on 11-07-16 with diffuse bilateral pneumonia. Treated with IV rocephin then Zithromax. Also, Solu-Medrolthen prednisone taper with inhalers and nebulizer. Finished the antibiotics and prednisone. Feeling much improved. Will check labs. Blood cultures were all negative. Recheck 12-28-16 to recheck CXR for any residual infiltrates or lesions. - CBC with Differential/Platelet - Comprehensive metabolic panel  2. Acute on chronic respiratory failure with hypoxia (HCC) Back on regular 2 LPM oxygen by nasal cannula and feels energy level much improved. Pulse oximetry 97% on oxygen  today. Still using Spiriva qd and Ventolin prn wheezing QID. Went back to the Symbicort 160/4.5 mcg/ACT 2 sprays BID (was on Dulera during hospitalization).  3. Hypokalemia Potassium was down to to 3.3 on admission date 11-07-16. Denies significant muscle cramps. Will check labs to see if further supplementation needed. - Comprehensive metabolic panel  4. Essential hypertension Medications changed during hospitalization to Metoprolol 25 mg BID and Amlodipine10 mg qd. Feeling well without hypotensive episodes. Recheck labs and follow up pending reports. - Comprehensive metabolic panel

## 2016-11-21 ENCOUNTER — Other Ambulatory Visit: Payer: Self-pay | Admitting: Family Medicine

## 2016-11-21 LAB — COMPREHENSIVE METABOLIC PANEL
A/G RATIO: 1.9 (ref 1.2–2.2)
ALBUMIN: 4.4 g/dL (ref 3.6–4.8)
ALK PHOS: 93 IU/L (ref 39–117)
ALT: 20 IU/L (ref 0–32)
AST: 16 IU/L (ref 0–40)
BUN / CREAT RATIO: 16 (ref 12–28)
BUN: 9 mg/dL (ref 8–27)
Bilirubin Total: <0.2 mg/dL (ref 0.0–1.2)
CO2: 26 mmol/L (ref 18–29)
CREATININE: 0.56 mg/dL — AB (ref 0.57–1.00)
Calcium: 9.6 mg/dL (ref 8.7–10.3)
Chloride: 96 mmol/L (ref 96–106)
GFR calc Af Amer: 110 mL/min/{1.73_m2} (ref >59)
GFR, EST NON AFRICAN AMERICAN: 95 mL/min/{1.73_m2} (ref >59)
GLOBULIN, TOTAL: 2.3 g/dL (ref 1.5–4.5)
Glucose: 100 mg/dL — ABNORMAL HIGH (ref 65–99)
POTASSIUM: 4.7 mmol/L (ref 3.5–5.2)
SODIUM: 140 mmol/L (ref 134–144)
Total Protein: 6.7 g/dL (ref 6.0–8.5)

## 2016-11-21 LAB — CBC WITH DIFFERENTIAL/PLATELET
BASOS: 0 %
Basophils Absolute: 0 10*3/uL (ref 0.0–0.2)
EOS (ABSOLUTE): 0.2 10*3/uL (ref 0.0–0.4)
EOS: 1 %
HEMATOCRIT: 34.5 % (ref 34.0–46.6)
HEMOGLOBIN: 10.9 g/dL — AB (ref 11.1–15.9)
Immature Grans (Abs): 0.1 10*3/uL (ref 0.0–0.1)
Immature Granulocytes: 1 %
LYMPHS ABS: 2.8 10*3/uL (ref 0.7–3.1)
Lymphs: 19 %
MCH: 29.9 pg (ref 26.6–33.0)
MCHC: 31.6 g/dL (ref 31.5–35.7)
MCV: 95 fL (ref 79–97)
MONOCYTES: 7 %
MONOS ABS: 1 10*3/uL — AB (ref 0.1–0.9)
NEUTROS ABS: 10.9 10*3/uL — AB (ref 1.4–7.0)
Neutrophils: 72 %
Platelets: 587 10*3/uL — ABNORMAL HIGH (ref 150–379)
RBC: 3.65 x10E6/uL — AB (ref 3.77–5.28)
RDW: 14.1 % (ref 12.3–15.4)
WBC: 14.9 10*3/uL — ABNORMAL HIGH (ref 3.4–10.8)

## 2016-11-22 DIAGNOSIS — J449 Chronic obstructive pulmonary disease, unspecified: Secondary | ICD-10-CM | POA: Diagnosis not present

## 2016-11-26 ENCOUNTER — Telehealth: Payer: Self-pay

## 2016-11-26 NOTE — Telephone Encounter (Signed)
Patient advised as directed below. Patient denies any fever at this time.

## 2016-11-26 NOTE — Telephone Encounter (Signed)
-----   Message from Tamsen Roersennis E Chrismon, [place] sent at 11/24/2016  9:33 AM EST ----- Potassium back to normal. WBC count still elevated but better than in the hospital. If having any fever, will need to be back on some antibiotic. Keep follow up appointment as planned to recheck chest x-ray. Sooner if needed.

## 2016-12-05 DIAGNOSIS — J449 Chronic obstructive pulmonary disease, unspecified: Secondary | ICD-10-CM | POA: Diagnosis not present

## 2016-12-05 DIAGNOSIS — R0902 Hypoxemia: Secondary | ICD-10-CM | POA: Diagnosis not present

## 2016-12-23 DIAGNOSIS — J449 Chronic obstructive pulmonary disease, unspecified: Secondary | ICD-10-CM | POA: Diagnosis not present

## 2016-12-28 ENCOUNTER — Ambulatory Visit: Payer: Self-pay | Admitting: Family Medicine

## 2017-01-01 ENCOUNTER — Encounter: Payer: Self-pay | Admitting: Family Medicine

## 2017-01-01 ENCOUNTER — Ambulatory Visit (INDEPENDENT_AMBULATORY_CARE_PROVIDER_SITE_OTHER): Payer: Medicare HMO | Admitting: Family Medicine

## 2017-01-01 VITALS — BP 112/72 | HR 98 | Temp 98.1°F | Resp 18 | Wt 144.6 lb

## 2017-01-01 DIAGNOSIS — J3089 Other allergic rhinitis: Secondary | ICD-10-CM | POA: Diagnosis not present

## 2017-01-01 DIAGNOSIS — J9621 Acute and chronic respiratory failure with hypoxia: Secondary | ICD-10-CM | POA: Diagnosis not present

## 2017-01-01 DIAGNOSIS — J180 Bronchopneumonia, unspecified organism: Secondary | ICD-10-CM | POA: Diagnosis not present

## 2017-01-01 DIAGNOSIS — F419 Anxiety disorder, unspecified: Secondary | ICD-10-CM

## 2017-01-01 DIAGNOSIS — F329 Major depressive disorder, single episode, unspecified: Secondary | ICD-10-CM

## 2017-01-01 DIAGNOSIS — F418 Other specified anxiety disorders: Secondary | ICD-10-CM

## 2017-01-01 NOTE — Progress Notes (Signed)
Patient: Hailey Gibbs Female    DOB: April 05, 1946   71 y.o.   MRN: [id number] Visit Date: 01/01/2017  Today's Provider: Dortha Kernennis Josefina Rynders, PA   Chief Complaint  Patient presents with  . Hypertension  . Hyperlipidemia  . Anxiety  . Follow-up   Subjective:    HPI  Hypertension, follow-up:  BP Readings from Last 3 Encounters:  01/01/17 112/72  11/20/16 118/82  11/10/16 (!) 153/62    She was last seen for hypertension 3 months ago.  BP at that visit was 122/76. Management since that visit includes continue medications. Restrict sodium in diet.She reports good compliance with treatment. She is not having side effects.  She is not exercising. She is adherent to low salt diet.   Outside blood pressures are not being checked. She is experiencing none.  Patient denies chest pain, chest pressure/discomfort, irregular heart beat and palpitations.   Cardiovascular risk factors include advanced age (older than 6755 for men, 465 for women), dyslipidemia and smoking/ tobacco exposure.  Use of agents associated with hypertension: none.   ------------------------------------------------------------------------    Lipid/Cholesterol, Follow-up:   Last seen for this 3 months ago.  Management since that visit includes continue medications. Follow a low fat diet.  Last Lipid Panel:    Component Value Date/Time   CHOL 150 09/24/2016 1241   CHOL 110 02/26/2014 0412   TRIG 160 (H) 09/24/2016 1241   TRIG 199 02/26/2014 0412   HDL 57 09/24/2016 1241   HDL 13 (L) 02/26/2014 0412   CHOLHDL 2.6 09/24/2016 1241   VLDL 40 02/26/2014 0412   LDLCALC 61 09/24/2016 1241   LDLCALC 57 02/26/2014 0412    She reports good compliance with treatment. She is not having side effects.   Wt Readings from Last 3 Encounters:  01/01/17 144 lb 9.6 oz (65.6 kg)  11/20/16 147 lb 12.8 oz (67 kg)  11/07/16 145 lb 12.8 oz (66.1 kg)     ------------------------------------------------------------------------  Anxiety/Depression: 3 month follow up. Patient requested to restart Zoloft because of anxiety and irritability from relationship with daughter-in-law. Zoloft restarted at 50 mg for 3 nights then increase to 100 mg qhs. Patient reports good compliance with treatment plan. Symptoms are stable.    Past Medical History:  Diagnosis Date  . Anxiety   . Carotid artery occlusion   . COPD (chronic obstructive pulmonary disease) (HCC)   . Depression   . GERD (gastroesophageal reflux disease)   . Hyperlipidemia   . Hypertension   . Seasonal allergies   . Shortness of breath dyspnea    Past Surgical History:  Procedure Laterality Date  . ENDARTERECTOMY Left 06/23/2015   Procedure: ENDARTERECTOMY CAROTID;  Surgeon: Annice NeedyJason S Dew, MD;  Location: [facility] ORS;  Service: Vascular;  Laterality: Left;  . TUBAL LIGATION    . VAGINAL HYSTERECTOMY     Family History  Problem Relation Age of Onset  . Heart disease Father 3775    CABG   . Hyperlipidemia Father   . Pneumonia Father   . Dementia Mother   . COPD Mother   . Healthy Sister   . Parkinson's disease Brother    Allergies  Allergen Reactions  . Codeine Anxiety         Previous Medications   ACETAMINOPHEN (TYLENOL) 500 MG TABLET    Take 1,000 mg by mouth every 6 (six) hours as needed for mild pain or headache.    AMLODIPINE (NORVASC) 10 MG TABLET    Take 1 tablet (10 mg  total) by mouth daily.   ASPIRIN 81 MG CHEWABLE TABLET    Chew 81 mg by mouth at bedtime.    ATORVASTATIN (LIPITOR) 10 MG TABLET    TAKE 1 TABLET (10 MG TOTAL) BY MOUTH DAILY.   BUDESONIDE-FORMOTEROL (SYMBICORT) 160-4.5 MCG/ACT INHALER    INHALE 2 PUFFS TWICE A DAY   CETIRIZINE (ZYRTEC) 10 MG TABLET    Take 10 mg by mouth at bedtime.    GUAIFENESIN-DEXTROMETHORPHAN (ROBITUSSIN DM) 100-10 MG/5ML SYRUP    Take 5 mLs by mouth every 4 (four) hours as needed for cough (chest congestion).   METOPROLOL  TARTRATE (LOPRESSOR) 25 MG TABLET    Take 1 tablet (25 mg total) by mouth 2 (two) times daily.   MULTIPLE VITAMIN (MULTIVITAMIN) TABLET    Take 1 tablet by mouth at bedtime.    SERTRALINE (ZOLOFT) 100 MG TABLET    1 (ONE) TABLET, ORAL, AT BEDTIME DAILY BY MOUTH   SPIRIVA HANDIHALER 18 MCG INHALATION CAPSULE    INHALE 1 CAPSULE ONCE A DAY   VENTOLIN HFA 108 (90 BASE) MCG/ACT INHALER    INHALE 1-2 PUFFS 4 TIMES A DAY AS NEEDED FOR WHEEZING    Review of Systems  Constitutional: Negative.   Respiratory: Negative.   Cardiovascular: Negative.   Psychiatric/Behavioral: Positive for dysphoric mood. The patient is nervous/anxious.     Social History  Substance Use Topics  . Smoking status: Current Every Day Smoker    Packs/day: 0.75    Years: 45.00    Types: Cigarettes  . Smokeless tobacco: Never Used     Comment: pt uses nictine patches also  . Alcohol use No   Objective:   BP 112/72 (BP Location: Right Arm, Patient Position: Sitting, Cuff Size: Normal)   Pulse 98   Temp 98.1 F (36.7 C) (Oral)   Resp 18   Wt 144 lb 9.6 oz (65.6 kg)   SpO2 97%   BMI 24.82 kg/m  BP Readings from Last 3 Encounters:  01/01/17 112/72  11/20/16 118/82  11/10/16 (!) 153/62   Wt Readings from Last 3 Encounters:  01/01/17 144 lb 9.6 oz (65.6 kg)  11/20/16 147 lb 12.8 oz (67 kg)  11/07/16 145 lb 12.8 oz (66.1 kg)     Physical Exam  Constitutional: She is oriented to person, place, and time. She appears well-developed and well-nourished. No distress.  HENT:  Head: Normocephalic and atraumatic.  Right Ear: Hearing normal.  Left Ear: Hearing normal.  Nose: Nose normal.  Eyes: Conjunctivae and lids are normal. Right eye exhibits no discharge. Left eye exhibits no discharge. No scleral icterus.  Neck: Neck supple.  Cardiovascular: Normal rate and regular rhythm.   Pulmonary/Chest: She is in respiratory distress.  Far distant breath sounds throughout. No rales or rhonchi.  Musculoskeletal: Normal  range of motion.  Neurological: She is alert and oriented to person, place, and time.  Skin: Skin is intact. No lesion and no rash noted.  Psychiatric: She has a normal mood and affect. Her speech is normal and behavior is normal. Thought content normal.      Assessment & Plan:     1. Diffuse pneumonia Feeling well. No sputum production with some cough occasionally associated with COPD with respiratory failure. Hospitalized on 11-07-16 with diffuse bilateral pneumonia on x-rays. Declines follow up CXR today due to cost. Will get CBC to be sure WBC count back to normal (was 14,900 on 11-20-16 post hospitalization follow up). Recheck pending reports. - CBC with Differential/Platelet  2. Acute on chronic respiratory failure with hypoxia (HCC) Unchanged dyspnea and pulse oximetry 97% on 2 LPM oxygen by nasal cannula 24 hours a day. Still using Spiriva qd, Symbicort 160-4.5 MCG/ACT 2 inhalations BID and Ventolin-HFA 2 inhalations QID prn.   3. Chronic non-seasonal allergic rhinitis, unspecified trigger Some stuffiness, rhinorrhea, sneezing and ticklish cough similar to allergies. May use Claritin or Allegra prn.  4. Anxiety and depression Feeling better on the Zoloft 100 mg HS after using it for 3 nights. Recommend she continue it at this dosage regularly. Recheck prn.

## 2017-01-02 LAB — CBC WITH DIFFERENTIAL/PLATELET
BASOS ABS: 0 10*3/uL (ref 0.0–0.2)
Basos: 0 %
EOS (ABSOLUTE): 0.2 10*3/uL (ref 0.0–0.4)
Eos: 2 %
HEMOGLOBIN: 11.3 g/dL (ref 11.1–15.9)
Hematocrit: 37.6 % (ref 34.0–46.6)
IMMATURE GRANS (ABS): 0 10*3/uL (ref 0.0–0.1)
Immature Granulocytes: 0 %
LYMPHS: 27 %
Lymphocytes Absolute: 2.7 10*3/uL (ref 0.7–3.1)
MCH: 28.3 pg (ref 26.6–33.0)
MCHC: 30.1 g/dL — AB (ref 31.5–35.7)
MCV: 94 fL (ref 79–97)
MONOCYTES: 7 %
Monocytes Absolute: 0.8 10*3/uL (ref 0.1–0.9)
Neutrophils Absolute: 6.6 10*3/uL (ref 1.4–7.0)
Neutrophils: 64 %
PLATELETS: 434 10*3/uL — AB (ref 150–379)
RBC: 3.99 x10E6/uL (ref 3.77–5.28)
RDW: 14 % (ref 12.3–15.4)
WBC: 10.2 10*3/uL (ref 3.4–10.8)

## 2017-01-03 ENCOUNTER — Telehealth: Payer: Self-pay

## 2017-01-03 NOTE — Telephone Encounter (Signed)
-----   Message from Kohl'sDennis E Chrismon, [place] sent at 01/03/2017  9:26 AM EST ----- WBC count back to normal and platelets coming back down. Recheck in 3 months.

## 2017-01-03 NOTE — Telephone Encounter (Signed)
Patient advised as directed below. She is going to call back for lab requisition.  Thanks,  -Margareth Kanner

## 2017-01-05 DIAGNOSIS — R0902 Hypoxemia: Secondary | ICD-10-CM | POA: Diagnosis not present

## 2017-01-05 DIAGNOSIS — J449 Chronic obstructive pulmonary disease, unspecified: Secondary | ICD-10-CM | POA: Diagnosis not present

## 2017-01-09 ENCOUNTER — Other Ambulatory Visit: Payer: Self-pay | Admitting: Family Medicine

## 2017-01-17 ENCOUNTER — Other Ambulatory Visit: Payer: Self-pay | Admitting: Family Medicine

## 2017-01-17 DIAGNOSIS — F329 Major depressive disorder, single episode, unspecified: Secondary | ICD-10-CM

## 2017-01-17 DIAGNOSIS — F419 Anxiety disorder, unspecified: Principal | ICD-10-CM

## 2017-01-17 NOTE — Telephone Encounter (Signed)
LOV 01/01/2017. Last refill 09/24/2016. Allene DillonEmily Drozdowski, CMA

## 2017-01-23 DIAGNOSIS — J449 Chronic obstructive pulmonary disease, unspecified: Secondary | ICD-10-CM | POA: Diagnosis not present

## 2017-02-05 DIAGNOSIS — R0902 Hypoxemia: Secondary | ICD-10-CM | POA: Diagnosis not present

## 2017-02-05 DIAGNOSIS — J449 Chronic obstructive pulmonary disease, unspecified: Secondary | ICD-10-CM | POA: Diagnosis not present

## 2017-02-07 ENCOUNTER — Other Ambulatory Visit: Payer: Self-pay | Admitting: Family Medicine

## 2017-02-07 ENCOUNTER — Other Ambulatory Visit (INDEPENDENT_AMBULATORY_CARE_PROVIDER_SITE_OTHER): Payer: Self-pay | Admitting: Vascular Surgery

## 2017-02-07 DIAGNOSIS — I779 Disorder of arteries and arterioles, unspecified: Secondary | ICD-10-CM

## 2017-02-07 DIAGNOSIS — I739 Peripheral vascular disease, unspecified: Principal | ICD-10-CM

## 2017-02-08 ENCOUNTER — Ambulatory Visit (INDEPENDENT_AMBULATORY_CARE_PROVIDER_SITE_OTHER): Payer: Self-pay | Admitting: Vascular Surgery

## 2017-02-08 ENCOUNTER — Ambulatory Visit (INDEPENDENT_AMBULATORY_CARE_PROVIDER_SITE_OTHER): Payer: Medicare HMO

## 2017-02-08 DIAGNOSIS — I779 Disorder of arteries and arterioles, unspecified: Secondary | ICD-10-CM

## 2017-02-08 DIAGNOSIS — I739 Peripheral vascular disease, unspecified: Principal | ICD-10-CM

## 2017-02-08 LAB — VAS US CAROTID
LCCAPSYS: 105 cm/s
LEFT ECA DIAS: -19 cm/s
LEFT VERTEBRAL DIAS: 9 cm/s
LICAPSYS: 95 cm/s
Left CCA dist dias: 17 cm/s
Left CCA dist sys: 86 cm/s
Left CCA prox dias: 20 cm/s
Left ICA dist dias: -22 cm/s
Left ICA dist sys: -97 cm/s
Left ICA prox dias: 20 cm/s
RIGHT CCA MID DIAS: 22 cm/s
RIGHT ECA DIAS: -22 cm/s
RIGHT VERTEBRAL DIAS: 0 cm/s
Right CCA prox dias: 20 cm/s
Right CCA prox sys: 81 cm/s
Right cca dist sys: -51 cm/s

## 2017-02-13 ENCOUNTER — Encounter (INDEPENDENT_AMBULATORY_CARE_PROVIDER_SITE_OTHER): Payer: Self-pay | Admitting: Vascular Surgery

## 2017-02-14 ENCOUNTER — Telehealth: Payer: Self-pay | Admitting: Family Medicine

## 2017-02-14 NOTE — Telephone Encounter (Signed)
Called Pt to schedule AWV with NHA - knb °

## 2017-02-20 DIAGNOSIS — J449 Chronic obstructive pulmonary disease, unspecified: Secondary | ICD-10-CM | POA: Diagnosis not present

## 2017-03-04 ENCOUNTER — Other Ambulatory Visit: Payer: Self-pay | Admitting: Family Medicine

## 2017-03-04 ENCOUNTER — Telehealth: Payer: Self-pay

## 2017-03-04 MED ORDER — ATORVASTATIN CALCIUM 10 MG PO TABS: 10.0000 mg | ORAL_TABLET | Freq: Every day | ORAL | 0 refills | Status: DC

## 2017-03-04 NOTE — Telephone Encounter (Signed)
Will send refill to the [facility] and leave instructions to recheck lipid panel in the next 4-6 weeks.

## 2017-03-04 NOTE — Telephone Encounter (Signed)
Reill request received from [facility] requesting a 90 days supply of atorvastatin (LIPITOR) 10 MG tablet

## 2017-03-05 DIAGNOSIS — J449 Chronic obstructive pulmonary disease, unspecified: Secondary | ICD-10-CM | POA: Diagnosis not present

## 2017-03-05 DIAGNOSIS — R0902 Hypoxemia: Secondary | ICD-10-CM | POA: Diagnosis not present

## 2017-03-11 ENCOUNTER — Telehealth: Payer: Self-pay | Admitting: Family Medicine

## 2017-03-11 NOTE — Telephone Encounter (Signed)
Called Pt to schedule AWV with NHA - knb °

## 2017-03-22 ENCOUNTER — Other Ambulatory Visit: Payer: Self-pay | Admitting: Family Medicine

## 2017-03-22 DIAGNOSIS — F329 Major depressive disorder, single episode, unspecified: Secondary | ICD-10-CM

## 2017-03-22 DIAGNOSIS — F32A Depression, unspecified: Secondary | ICD-10-CM

## 2017-03-22 DIAGNOSIS — F419 Anxiety disorder, unspecified: Principal | ICD-10-CM

## 2017-03-22 MED ORDER — BUDESONIDE-FORMOTEROL FUMARATE 160-4.5 MCG/ACT IN AERO: 2.0000 | INHALATION_SPRAY | Freq: Two times a day (BID) | RESPIRATORY_TRACT | 3 refills | Status: DC

## 2017-03-22 MED ORDER — SERTRALINE HCL 100 MG PO TABS: ORAL_TABLET | ORAL | 3 refills | Status: DC

## 2017-03-22 NOTE — Telephone Encounter (Addendum)
[facility] faxed a request for a 90-days supply for the following medications. Thanks CC  sertraline (ZOLOFT) 100 MG tablet  1 table, oral, at bedtime daily by mouth.  sertraline (ZOLOFT) 100 MG tablet  Take 1 tablet by mouth daily.  budesonide-formoterol (SYMBICORT) 160-4.5 MCG/ACT inhaler  Inhale 2 puffs twice a day

## 2017-03-23 DIAGNOSIS — J449 Chronic obstructive pulmonary disease, unspecified: Secondary | ICD-10-CM | POA: Diagnosis not present

## 2017-03-25 ENCOUNTER — Other Ambulatory Visit: Payer: Self-pay | Admitting: Family Medicine

## 2017-03-25 MED ORDER — AMLODIPINE BESYLATE 10 MG PO TABS: 10.0000 mg | ORAL_TABLET | Freq: Every day | ORAL | 2 refills | Status: DC

## 2017-03-25 NOTE — Telephone Encounter (Signed)
[facility] faxed a request for a 90-days supply for the following medication. Thanks CC   amLODipine (NORVASC) 10 MG tablet  Take 1 tablet by mouth daily

## 2017-04-05 DIAGNOSIS — J449 Chronic obstructive pulmonary disease, unspecified: Secondary | ICD-10-CM | POA: Diagnosis not present

## 2017-04-05 DIAGNOSIS — R0902 Hypoxemia: Secondary | ICD-10-CM | POA: Diagnosis not present

## 2017-04-12 ENCOUNTER — Other Ambulatory Visit: Payer: Self-pay | Admitting: Family Medicine

## 2017-04-12 ENCOUNTER — Encounter: Payer: Self-pay | Admitting: Family Medicine

## 2017-04-12 ENCOUNTER — Ambulatory Visit (INDEPENDENT_AMBULATORY_CARE_PROVIDER_SITE_OTHER): Payer: Medicare HMO | Admitting: Family Medicine

## 2017-04-12 ENCOUNTER — Ambulatory Visit (INDEPENDENT_AMBULATORY_CARE_PROVIDER_SITE_OTHER): Payer: Medicare HMO

## 2017-04-12 VITALS — BP 102/52 | HR 74 | Temp 98.7°F | Ht 64.0 in | Wt 142.4 lb

## 2017-04-12 DIAGNOSIS — J449 Chronic obstructive pulmonary disease, unspecified: Secondary | ICD-10-CM | POA: Diagnosis not present

## 2017-04-12 DIAGNOSIS — R0902 Hypoxemia: Secondary | ICD-10-CM

## 2017-04-12 DIAGNOSIS — Z1159 Encounter for screening for other viral diseases: Secondary | ICD-10-CM | POA: Diagnosis not present

## 2017-04-12 DIAGNOSIS — E782 Mixed hyperlipidemia: Secondary | ICD-10-CM

## 2017-04-12 DIAGNOSIS — Z720 Tobacco use: Secondary | ICD-10-CM | POA: Diagnosis not present

## 2017-04-12 DIAGNOSIS — Z0000 Encounter for general adult medical examination without abnormal findings: Secondary | ICD-10-CM | POA: Diagnosis not present

## 2017-04-12 DIAGNOSIS — I10 Essential (primary) hypertension: Secondary | ICD-10-CM

## 2017-04-12 DIAGNOSIS — Z23 Encounter for immunization: Secondary | ICD-10-CM | POA: Diagnosis not present

## 2017-04-12 NOTE — Patient Instructions (Addendum)
[name] , Thank you for taking time to come for your Medicare Wellness Visit. I appreciate your ongoing commitment to your health goals. Please review the following plan we discussed and let me know if I can assist you in the future.   Screening recommendations/referrals: Colonoscopy: patient declined today Mammogram: completed 01/17/2011, declined today  Bone Density: patient declined today  Recommended yearly ophthalmology/optometry visit for glaucoma screening and checkup Recommended yearly dental visit for hygiene and checkup  Vaccinations: Influenza vaccine: patient declined today Pneumococcal vaccine: completed prevnar 13 today, prevnar 23 due in one year Tdap vaccine: check with your insurance on cost Shingles vaccine: check with your insurane on cost   Advanced directives: Advance directive discussed with you today. I have provided a copy for you to complete at home and have notarized. Once this is complete please bring a copy in to our office so we can scan it into your chart.  Conditions/risks identified: Recommend increasing eater intake to 2-3 cups a day.   Next appointment: none, return for annual wellness visit in one year.    Preventive Care 4865 Years and Older, Female Preventive care refers to lifestyle choices and visits with your health care provider that can promote health and wellness. What does preventive care include?  A yearly physical exam. This is also called an annual well check.  Dental exams once or twice a year.  Routine eye exams. Ask your health care provider how often you should have your eyes checked.  Personal lifestyle choices, including:  Daily care of your teeth and gums.  Regular physical activity.  Eating a healthy diet.  Avoiding tobacco and drug use.  Limiting alcohol use.  Practicing safe sex.  Taking low-dose aspirin every day.  Taking vitamin and mineral supplements as recommended by your health care provider. What happens  during an annual well check? The services and screenings done by your health care provider during your annual well check will depend on your age, overall health, lifestyle risk factors, and family history of disease. Counseling  Your health care provider may ask you questions about your:  Alcohol use.  Tobacco use.  Drug use.  Emotional well-being.  Home and relationship well-being.  Sexual activity.  Eating habits.  History of falls.  Memory and ability to understand (cognition).  Work and work Astronomerenvironment.  Reproductive health. Screening  You may have the following tests or measurements:  Height, weight, and BMI.  Blood pressure.  Lipid and cholesterol levels. These may be checked every 5 years, or more frequently if you are over 70 years old.  Skin check.  Lung cancer screening. You may have this screening every year starting at age 70 if you have a 30-pack-year history of smoking and currently smoke or have quit within the past 15 years.  Fecal occult blood test (FOBT) of the stool. You may have this test every year starting at age 70.  Flexible sigmoidoscopy or colonoscopy. You may have a sigmoidoscopy every 5 years or a colonoscopy every 10 years starting at age 70.  Hepatitis C blood test.  Hepatitis B blood test.  Sexually transmitted disease (STD) testing.  Diabetes screening. This is done by checking your blood sugar (glucose) after you have not eaten for a while (fasting). You may have this done every 1-3 years.  Bone density scan. This is done to screen for osteoporosis. You may have this done starting at age 70.  Mammogram. This may be done every 1-2 years. Talk to your  health care provider about how often you should have regular mammograms. Talk with your health care provider about your test results, treatment options, and if necessary, the need for more tests. Vaccines  Your health care provider may recommend certain vaccines, such  as:  Influenza vaccine. This is recommended every year.  Tetanus, diphtheria, and acellular pertussis (Tdap, Td) vaccine. You may need a Td booster every 10 years.  Zoster vaccine. You may need this after age 93.  Pneumococcal 13-valent conjugate (PCV13) vaccine. One dose is recommended after age 80.  Pneumococcal polysaccharide (PPSV23) vaccine. One dose is recommended after age 53. Talk to your health care provider about which screenings and vaccines you need and how often you need them. This information is not intended to replace advice given to you by your health care provider. Make sure you discuss any questions you have with your health care provider. Document Released: 12/23/2015 Document Revised: 08/15/2016 Document Reviewed: 09/27/2015 Elsevier Interactive Patient Education  2017 [place] Prevention in the Home Falls can cause injuries. They can happen to people of all ages. There are many things you can do to make your home safe and to help prevent falls. What can I do on the outside of my home?  Regularly fix the edges of walkways and driveways and fix any cracks.  Remove anything that might make you trip as you walk through a door, such as a raised step or threshold.  Trim any bushes or trees on the path to your home.  Use bright outdoor lighting.  Clear any walking paths of anything that might make someone trip, such as rocks or tools.  Regularly check to see if handrails are loose or broken. Make sure that both sides of any steps have handrails.  Any raised decks and porches should have guardrails on the edges.  Have any leaves, snow, or ice cleared regularly.  Use sand or salt on walking paths during winter.  Clean up any spills in your garage right away. This includes oil or grease spills. What can I do in the bathroom?  Use night lights.  Install grab bars by the toilet and in the tub and shower. Do not use towel bars as grab bars.  Use  non-skid mats or decals in the tub or shower.  If you need to sit down in the shower, use a plastic, non-slip stool.  Keep the floor dry. Clean up any water that spills on the floor as soon as it happens.  Remove soap buildup in the tub or shower regularly.  Attach bath mats securely with double-sided non-slip rug tape.  Do not have throw rugs and other things on the floor that can make you trip. What can I do in the bedroom?  Use night lights.  Make sure that you have a light by your bed that is easy to reach.  Do not use any sheets or blankets that are too big for your bed. They should not hang down onto the floor.  Have a firm chair that has side arms. You can use this for support while you get dressed.  Do not have throw rugs and other things on the floor that can make you trip. What can I do in the kitchen?  Clean up any spills right away.  Avoid walking on wet floors.  Keep items that you use a lot in easy-to-reach places.  If you need to reach something above you, use a strong step stool that has a  grab bar.  Keep electrical cords out of the way.  Do not use floor polish or wax that makes floors slippery. If you must use wax, use non-skid floor wax.  Do not have throw rugs and other things on the floor that can make you trip. What can I do with my stairs?  Do not leave any items on the stairs.  Make sure that there are handrails on both sides of the stairs and use them. Fix handrails that are broken or loose. Make sure that handrails are as long as the stairways.  Check any carpeting to make sure that it is firmly attached to the stairs. Fix any carpet that is loose or worn.  Avoid having throw rugs at the top or bottom of the stairs. If you do have throw rugs, attach them to the floor with carpet tape.  Make sure that you have a light switch at the top of the stairs and the bottom of the stairs. If you do not have them, ask someone to add them for you. What  else can I do to help prevent falls?  Wear shoes that:  Do not have high heels.  Have rubber bottoms.  Are comfortable and fit you well.  Are closed at the toe. Do not wear sandals.  If you use a stepladder:  Make sure that it is fully opened. Do not climb a closed stepladder.  Make sure that both sides of the stepladder are locked into place.  Ask someone to hold it for you, if possible.  Clearly mark and make sure that you can see:  Any grab bars or handrails.  First and last steps.  Where the edge of each step is.  Use tools that help you move around (mobility aids) if they are needed. These include:  Canes.  Walkers.  Scooters.  Crutches.  Turn on the lights when you go into a dark area. Replace any light bulbs as soon as they burn out.  Set up your furniture so you have a clear path. Avoid moving your furniture around.  If any of your floors are uneven, fix them.  If there are any pets around you, be aware of where they are.  Review your medicines with your doctor. Some medicines can make you feel dizzy. This can increase your chance of falling. Ask your doctor what other things that you can do to help prevent falls. This information is not intended to replace advice given to you by your health care provider. Make sure you discuss any questions you have with your health care provider. Document Released: 09/22/2009 Document Revised: 05/03/2016 Document Reviewed: 12/31/2014 Elsevier Interactive Patient Education  2017 [employer].

## 2017-04-12 NOTE — Progress Notes (Signed)
Subjective:   Hailey Gibbs is a 71 y.o. female who presents for an Initial Medicare Annual Wellness Visit.  Review of Systems    N/A  Cardiac Risk Factors include: advanced age (>[id number], 72>65 women);hypertension;dyslipidemia     Objective:    Today's Vitals   04/12/17 1302  BP: (!) 102/52  Pulse: 74  Temp: 98.7 F (37.1 C)  Weight: 142 lb 6.4 oz (64.6 kg)  Height: 5\' 4"  (1.626 m)   Body mass index is 24.44 kg/m.   Current Medications (verified) Outpatient Encounter Prescriptions as of 04/12/2017  Medication Sig  . acetaminophen (TYLENOL) 500 MG tablet Take 1,000 mg by mouth every 6 (six) hours as needed for mild pain or headache.   Marland Kitchen. aspirin 81 MG chewable tablet Chew 81 mg by mouth at bedtime.   Marland Kitchen. atorvastatin (LIPITOR) 10 MG tablet Take 1 tablet (10 mg total) by mouth daily. Due for recheck of lipid panel.  . budesonide-formoterol (SYMBICORT) 160-4.5 MCG/ACT inhaler Inhale 2 puffs into the lungs 2 (two) times daily.  . cetirizine (ZYRTEC) 10 MG tablet Take 10 mg by mouth at bedtime.   . Cholecalciferol (VITAMIN D3) 2000 units TABS Take by mouth daily.  . metoprolol tartrate (LOPRESSOR) 25 MG tablet Take 1 tablet (25 mg total) by mouth 2 (two) times daily.  . Multiple Vitamin (MULTIVITAMIN) tablet Take 1 tablet by mouth at bedtime.   . sertraline (ZOLOFT) 100 MG tablet 1 (ONE) TABLET, ORAL, AT BEDTIME DAILY BY MOUTH  . SPIRIVA HANDIHALER 18 MCG inhalation capsule INHALE 1 CAPSULE ONCE A DAY  . VENTOLIN HFA 108 (90 Base) MCG/ACT inhaler INHALE 1-2 PUFFS 4 TIMES A DAY AS NEEDED FOR WHEEZING  . vitamin E 400 UNIT capsule Take 400 Units by mouth daily.  Marland Kitchen. guaiFENesin-dextromethorphan (ROBITUSSIN DM) 100-10 MG/5ML syrup Take 5 mLs by mouth every 4 (four) hours as needed for cough (chest congestion). (Patient not taking: Reported on 04/12/2017)  . [DISCONTINUED] amLODipine (NORVASC) 10 MG tablet Take 1 tablet (10 mg total) by mouth daily. (Patient not taking: Reported on  04/12/2017)   No facility-administered encounter medications on file as of 04/12/2017.     Allergies (verified) Codeine   History: Past Medical History:  Diagnosis Date  . Anxiety   . Carotid artery occlusion   . COPD (chronic obstructive pulmonary disease) (HCC)   . Depression   . GERD (gastroesophageal reflux disease)   . Hyperlipidemia   . Hypertension   . Seasonal allergies   . Shortness of breath dyspnea    Past Surgical History:  Procedure Laterality Date  . ENDARTERECTOMY Left 06/23/2015   Procedure: ENDARTERECTOMY CAROTID;  Surgeon: Annice NeedyJason S Dew, MD;  Location: [facility] ORS;  Service: Vascular;  Laterality: Left;  . TUBAL LIGATION    . VAGINAL HYSTERECTOMY     Family History  Problem Relation Age of Onset  . Heart disease Father 7475    CABG   . Hyperlipidemia Father   . Pneumonia Father   . Dementia Mother   . COPD Mother   . Diabetes Sister   . Parkinson's disease Brother   . Post-traumatic stress disorder Son    Social History   Occupational History  . Not on file.   Social History Main Topics  . Smoking status: Current Every Day Smoker    Packs/day: 0.75    Years: 45.00    Types: Cigarettes  . Smokeless tobacco: Never Used     Comment: pt uses nictine patches occasioanlly   .  Alcohol use No  . Drug use: No  . Sexual activity: Not on file    Tobacco Counseling Ready to quit: No Counseling given: Yes   Activities of Daily Living In your present state of health, do you have any difficulty performing the following activities: 04/12/2017 11/07/2016  Hearing? N N  Vision? N N  Difficulty concentrating or making decisions? N N  Walking or climbing stairs? N N  Dressing or bathing? N N  Doing errands, shopping? N N  Preparing Food and eating ? N -  Using the Toilet? N -  In the past six months, have you accidently leaked urine? Y -  Do you have problems with loss of bowel control? N -  Managing your Medications? N -  Managing your Finances? N -    Housekeeping or managing your Housekeeping? N -  Some recent data might be hidden    Immunizations and Health Maintenance Immunization History  Administered Date(s) Administered  . Pneumococcal Conjugate-13 04/12/2017   There are no preventive care reminders to display for this patient.  Patient Care Team: Tamsen Roers, PA as PCP - General (Physician Assistant) Annice Needy, MD as Referring Physician (Vascular Surgery)  Indicate any recent Medical Services you may have received from other than [facility] providers in the past year (date may be approximate).     Assessment:   This is a routine wellness examination for Hailey Gibbs.   Hearing/Vision screen Vision Screening Comments: Patient sees Dr. Eather Colas every 6 weeks.   Dietary issues and exercise activities discussed: Current Exercise Habits: The patient does not participate in regular exercise at present, Exercise limited by: respiratory conditions(s) (COPD)  Goals    . Increase water intake          Recommend increasing eater intake to 2-3 cups a day.       Depression Screen PHQ 2/9 Scores 04/12/2017 04/12/2017  PHQ - 2 Score 0 0  PHQ- 9 Score 2 2    Fall Risk Fall Risk  04/12/2017  Falls in the past year? Yes  Number falls in past yr: 2 or more  Injury with Fall? Yes  Risk Factor Category  High Fall Risk  Risk for fall due to : History of fall(s)  Follow up Falls prevention discussed    Cognitive Function:     6CIT Screen 04/12/2017  What Year? 0 points  What month? 0 points  What time? 0 points  Count back from 20 0 points  Months in reverse 0 points  Repeat phrase 4 points  Total Score 4    Screening Tests Health Maintenance  Topic Date Due  . MAMMOGRAM  12/10/2017 (Originally 05/05/1997)  . DEXA SCAN  04/12/2018 (Originally 05/05/2012)  . TETANUS/TDAP  04/12/2018 (Originally 05/05/1966)  . COLONOSCOPY  12/10/2026 (Originally 05/05/1997)  . INFLUENZA VACCINE  07/10/2017  . PNA vac Low Risk Adult (2 of 2 -  PPSV23) 04/12/2018  . Hepatitis C Screening  Completed      Plan:    I have personally reviewed and addressed the Medicare Annual Wellness questionnaire and have noted the following in the patient's chart:  A. Medical and social history B. Use of alcohol, tobacco or illicit drugs  C. Current medications and supplements D. Functional ability and status E.  Nutritional status F.  Physical activity G. Advance directives H. List of other physicians I.  Hospitalizations, surgeries, and ER visits in previous 12 months J.  Vitals K. Screenings such as hearing and vision  if needed, cognitive and depression L. Referrals and appointments - none  In addition, I have reviewed and discussed with patient certain preventive protocols, quality metrics, and best practice recommendations. A written personalized care plan for preventive services as well as general preventive health recommendations were provided to patient.  See attached scanned questionnaire for additional information.   Signed,  Marin Roberts, LPN Nurse Health Advisor   MD Recommendations: Advised patient to receive a mammogram, colonoscopy, bone density, tetanus vaccine- Pt declined these.  She will receive prevnar 13 today as well as hep c screening.   Reviewed the Nurse Health Advisor's documentation of screenings. Agree with note and plan. Was available for consultation.

## 2017-04-12 NOTE — Progress Notes (Signed)
Patient: Hailey Gibbs, Female    DOB: 06-23-1946, 71 y.o.   MRN: [id number] Visit Date: 04/12/2017  Today's Provider: Dortha Kernennis Richerd Grime, PA   Chief Complaint  Patient presents with  . Annual Exam   Subjective:    Annual physical exam Hailey Gibbs is a 71 y.o. female who presents today for health maintenance and complete physical. She feels well. She reports exercising none. She reports she is sleeping fairly well.  -----------------------------------------------------------------   Review of Systems  Constitutional: Negative.   HENT: Positive for sneezing.   Eyes: Negative.   Respiratory: Positive for cough and shortness of breath.   Cardiovascular: Negative.   Gastrointestinal: Negative.   Endocrine: Negative.   Genitourinary: Negative.   Musculoskeletal: Negative.   Skin: Negative.   Allergic/Immunologic: Positive for environmental allergies.  Neurological: Negative.   Hematological: Bruises/bleeds easily.  Psychiatric/Behavioral: Negative.     Social History      She  reports that she has been smoking Cigarettes.  She has a 33.75 pack-year smoking history. She has never used smokeless tobacco. She reports that she does not drink alcohol or use drugs.       Social History   Social History  . Marital status: Divorced    Spouse name: N/A  . Number of children: N/A  . Years of education: N/A   Social History Main Topics  . Smoking status: Current Every Day Smoker    Packs/day: 0.75    Years: 45.00    Types: Cigarettes  . Smokeless tobacco: Never Used     Comment: pt uses nictine patches occasioanlly   . Alcohol use No  . Drug use: No  . Sexual activity: Not Asked   Other Topics Concern  . None   Social History Narrative  . None    Past Medical History:  Diagnosis Date  . Anxiety   . Carotid artery occlusion   . COPD (chronic obstructive pulmonary disease) (HCC)   . Depression   . GERD (gastroesophageal reflux disease)   .  Hyperlipidemia   . Hypertension   . Seasonal allergies   . Shortness of breath dyspnea      Patient Active Problem List   Diagnosis Date Noted  . Sepsis (HCC) 11/07/2016  . Chronic obstructive pulmonary disease with hypoxia (HCC) 10/26/2016  . Abnormal chest sounds 08/29/2015  . Obstructive chronic bronchitis with exacerbation (HCC) 08/29/2015  . Anxiety and depression 08/29/2015  . BP (high blood pressure) 08/29/2015  . Carotid stenosis 06/23/2015  . Pre-operative cardiovascular examination 05/30/2015  . Tobacco use 05/30/2015  . Hyperlipidemia     Past Surgical History:  Procedure Laterality Date  . ENDARTERECTOMY Left 06/23/2015   Procedure: ENDARTERECTOMY CAROTID;  Surgeon: Annice NeedyJason S Dew, MD;  Location: [facility] ORS;  Service: Vascular;  Laterality: Left;  . TUBAL LIGATION    . VAGINAL HYSTERECTOMY      Family History        Family Status  Relation Status  . Father Deceased  . Mother Deceased  . Sister Alive  . Brother Alive  . Son Alive  . Son Deceased        Her family history includes COPD in her mother; Dementia in her mother; Diabetes in her sister; Heart disease (age of onset: 975) in her father; Hyperlipidemia in her father; Parkinson's disease in her brother; Pneumonia in her father; Post-traumatic stress disorder in her son.     Allergies  Allergen Reactions  . Codeine Anxiety  Current Outpatient Prescriptions:  .  acetaminophen (TYLENOL) 500 MG tablet, Take 1,000 mg by mouth every 6 (six) hours as needed for mild pain or headache. , Disp: , Rfl:  .  aspirin 81 MG chewable tablet, Chew 81 mg by mouth at bedtime. , Disp: , Rfl:  .  atorvastatin (LIPITOR) 10 MG tablet, Take 1 tablet (10 mg total) by mouth daily. Due for recheck of lipid panel., Disp: 90 tablet, Rfl: 0 .  budesonide-formoterol (SYMBICORT) 160-4.5 MCG/ACT inhaler, Inhale 2 puffs into the lungs 2 (two) times daily., Disp: 1 Inhaler, Rfl: 3 .  cetirizine (ZYRTEC) 10 MG tablet, Take 10 mg  by mouth at bedtime. , Disp: , Rfl:  .  Cholecalciferol (VITAMIN D3) 2000 units TABS, Take by mouth daily., Disp: , Rfl:  .  metoprolol tartrate (LOPRESSOR) 25 MG tablet, Take 1 tablet (25 mg total) by mouth 2 (two) times daily., Disp: 60 tablet, Rfl: 0 .  Multiple Vitamin (MULTIVITAMIN) tablet, Take 1 tablet by mouth at bedtime. , Disp: , Rfl:  .  sertraline (ZOLOFT) 100 MG tablet, 1 (ONE) TABLET, ORAL, AT BEDTIME DAILY BY MOUTH, Disp: 30 tablet, Rfl: 3 .  SPIRIVA HANDIHALER 18 MCG inhalation capsule, INHALE 1 CAPSULE ONCE A DAY, Disp: 30 capsule, Rfl: 11 .  VENTOLIN HFA 108 (90 Base) MCG/ACT inhaler, INHALE 1-2 PUFFS 4 TIMES A DAY AS NEEDED FOR WHEEZING, Disp: 18 Inhaler, Rfl: 2 .  vitamin E 400 UNIT capsule, Take 400 Units by mouth daily., Disp: , Rfl:    Patient Care Team: Tamsen Roers, PA as PCP - General (Physician Assistant) Annice Needy, MD as Referring Physician (Vascular Surgery)      Objective:   Vitals: BP (!) 102/52 (BP Location: Right Arm, Patient Position: Sitting, Cuff Size: Normal)   Pulse 74   Temp 98.7 F (37.1 C) (Oral)   Ht 5\' 4"  (1.626 m)   Wt 142 lb 6.4 oz (64.6 kg)   BMI 24.44 kg/m    Vitals:   04/12/17 1349  BP: (!) 102/52  Pulse: 74  Temp: 98.7 F (37.1 C)  TempSrc: Oral  Weight: 142 lb 6.4 oz (64.6 kg)  Height: 5\' 4"  (1.626 m)     Physical Exam  Constitutional: She is oriented to person, place, and time. She appears well-developed and well-nourished.  HENT:  Head: Normocephalic and atraumatic.  Right Ear: External ear normal.  Left Ear: External ear normal.  Nose: Nose normal.  Mouth/Throat: Oropharynx is clear and moist.  Eyes: Conjunctivae and EOM are normal. Pupils are equal, round, and reactive to light. Right eye exhibits no discharge.  Neck: Normal range of motion. Neck supple. No tracheal deviation present. No thyromegaly present.  Cardiovascular: Normal rate, regular rhythm, normal heart sounds and intact distal pulses.   No murmur  heard. Pulmonary/Chest: She has no wheezes. She has no rales. She exhibits no tenderness.  Very distant breath sounds without wheeze, rales or rhonchi at the present.   Abdominal: Soft. She exhibits no distension and no mass. There is no tenderness. There is no rebound and no guarding.  Musculoskeletal: Normal range of motion. She exhibits no edema or tenderness.  Lymphadenopathy:    She has no cervical adenopathy.  Neurological: She is alert and oriented to person, place, and time. She has normal reflexes. No cranial nerve deficit. She exhibits normal muscle tone. Coordination normal.  Skin: Skin is warm and dry. No rash noted. No erythema.  Psychiatric: She has a normal mood and  affect. Her behavior is normal. Judgment and thought content normal.     Depression Screen PHQ 2/9 Scores 04/12/2017 04/12/2017  PHQ - 2 Score 0 0  PHQ- 9 Score 2 2      Assessment & Plan:     Routine Health Maintenance and Physical Exam  Exercise Activities and Dietary recommendations Goals    . Increase water intake          Recommend increasing water intake to 2-3 cups a day.        Immunization History  Administered Date(s) Administered  . Pneumococcal Conjugate-13 04/12/2017    Health Maintenance  Topic Date Due  . MAMMOGRAM  12/10/2017 (Originally 05/05/1997)  . DEXA SCAN  04/12/2018 (Originally 05/05/2012)  . TETANUS/TDAP  04/12/2018 (Originally 05/05/1966)  . COLONOSCOPY  12/10/2026 (Originally 05/05/1997)  . INFLUENZA VACCINE  07/10/2017  . PNA vac Low Risk Adult (2 of 2 - PPSV23) 04/12/2018  . Hepatitis C Screening  Completed     Discussed health benefits of physical activity, and encouraged her to engage in regular exercise appropriate for her age and condition.    --------------------------------------------------------------------  1. Annual physical exam General health stable. Refuses mammograms, colonoscopy, BMD and tetanus booster. Was given Prevnar-13 and will get routine  labs. See screening test results as above.  2. Chronic obstructive pulmonary disease with hypoxia (HCC) Feels breathing is well controlled with oxygen 2 LPM 24 hours a day by oxygen concentrator to a nasal cannula delivery system. Continues to use Symbicor 160-4.5 mcg/act 2 puffs BID, Spiriva 18 mcg qd by handihaler and Ventolin 2 puffs QID prn wheeze. Recheck CBC and continue present regimen. - CBC with Differential/Platelet  3. Essential hypertension Well controlled and continues Metoprolol 25 mg qd without hypotensive episodes. No dizziness or palpitations. No edema. Recheck routine labs. - CBC with Differential/Platelet - Comprehensive metabolic panel - TSH  4. Tobacco use Continues to smoke 3/4 ppd. Advised to stop all smoking but declines.  5. Mixed hyperlipidemia Still tolerating the Atorvastatin 10 mg qd without side effects. Will continue to work on low fat diet and recheck labs. - Comprehensive metabolic panel - Lipid panel - TSH  6. Need for hepatitis C screening test - Hepatitis C antibody    Dortha Kern, PA  [facility]

## 2017-04-13 LAB — COMPREHENSIVE METABOLIC PANEL
ALT: 15 IU/L (ref 0–32)
AST: 17 IU/L (ref 0–40)
Albumin/Globulin Ratio: 1.8 (ref 1.2–2.2)
Albumin: 4.6 g/dL (ref 3.6–4.8)
Alkaline Phosphatase: 104 IU/L (ref 39–117)
BILIRUBIN TOTAL: 0.2 mg/dL (ref 0.0–1.2)
BUN/Creatinine Ratio: 12 (ref 12–28)
BUN: 8 mg/dL (ref 8–27)
CALCIUM: 10.1 mg/dL (ref 8.7–10.3)
CHLORIDE: 95 mmol/L — AB (ref 96–106)
CO2: 31 mmol/L — ABNORMAL HIGH (ref 18–29)
Creatinine, Ser: 0.68 mg/dL (ref 0.57–1.00)
GFR calc non Af Amer: 90 mL/min/{1.73_m2} (ref >59)
GFR, EST AFRICAN AMERICAN: 103 mL/min/{1.73_m2} (ref >59)
GLUCOSE: 113 mg/dL — AB (ref 65–99)
Globulin, Total: 2.6 g/dL (ref 1.5–4.5)
Potassium: 5 mmol/L (ref 3.5–5.2)
Sodium: 140 mmol/L (ref 134–144)
TOTAL PROTEIN: 7.2 g/dL (ref 6.0–8.5)

## 2017-04-13 LAB — LIPID PANEL
Chol/HDL Ratio: 2.6 ratio (ref 0.0–4.4)
Cholesterol, Total: 166 mg/dL (ref 100–199)
HDL: 65 mg/dL (ref >39)
LDL Calculated: 73 mg/dL (ref 0–99)
TRIGLYCERIDES: 141 mg/dL (ref 0–149)
VLDL CHOLESTEROL CAL: 28 mg/dL (ref 5–40)

## 2017-04-13 LAB — TSH: TSH: 2.39 u[IU]/mL (ref 0.450–4.500)

## 2017-04-13 LAB — CBC WITH DIFFERENTIAL/PLATELET
Basophils Absolute: 0 10*3/uL (ref 0.0–0.2)
Basos: 0 %
EOS (ABSOLUTE): 0.1 10*3/uL (ref 0.0–0.4)
EOS: 1 %
Hematocrit: 39 % (ref 34.0–46.6)
Hemoglobin: 12.4 g/dL (ref 11.1–15.9)
IMMATURE GRANULOCYTES: 0 %
Immature Grans (Abs): 0 10*3/uL (ref 0.0–0.1)
Lymphocytes Absolute: 3.1 10*3/uL (ref 0.7–3.1)
Lymphs: 30 %
MCH: 29.4 pg (ref 26.6–33.0)
MCHC: 31.8 g/dL (ref 31.5–35.7)
MCV: 92 fL (ref 79–97)
MONOS ABS: 0.7 10*3/uL (ref 0.1–0.9)
Monocytes: 6 %
NEUTROS PCT: 63 %
Neutrophils Absolute: 6.4 10*3/uL (ref 1.4–7.0)
PLATELETS: 402 10*3/uL — AB (ref 150–379)
RBC: 4.22 x10E6/uL (ref 3.77–5.28)
RDW: 13.6 % (ref 12.3–15.4)
WBC: 10.3 10*3/uL (ref 3.4–10.8)

## 2017-04-13 LAB — HEPATITIS C ANTIBODY: Hep C Virus Ab: <0.1 s/co ratio (ref 0.0–0.9)

## 2017-04-15 ENCOUNTER — Telehealth: Payer: Self-pay

## 2017-04-15 NOTE — Telephone Encounter (Signed)
Patient advised.

## 2017-04-15 NOTE — Telephone Encounter (Signed)
-----   Message from Tamsen Roersennis E Chrism[place], [place] sent at 04/13/2017  4:39 PM EDT ----- Cholesterol and blood counts normal. CO2 level elevated as expected with lung issues (of course, still need the oxygen and inhaler treatment). Continue present medications and recheck in 6 months.

## 2017-04-16 ENCOUNTER — Telehealth: Payer: Self-pay | Admitting: Family Medicine

## 2017-04-16 NOTE — Telephone Encounter (Signed)
[facility] -- patient needs assistance with inhalers and needs new dentures. Please take a message and send me a staff msg & I will call her back. knb

## 2017-04-17 ENCOUNTER — Encounter: Payer: Self-pay | Admitting: Family Medicine

## 2017-04-22 DIAGNOSIS — J449 Chronic obstructive pulmonary disease, unspecified: Secondary | ICD-10-CM | POA: Diagnosis not present

## 2017-05-05 DIAGNOSIS — J449 Chronic obstructive pulmonary disease, unspecified: Secondary | ICD-10-CM | POA: Diagnosis not present

## 2017-05-05 DIAGNOSIS — R0902 Hypoxemia: Secondary | ICD-10-CM | POA: Diagnosis not present

## 2017-05-23 DIAGNOSIS — J449 Chronic obstructive pulmonary disease, unspecified: Secondary | ICD-10-CM | POA: Diagnosis not present

## 2017-06-05 DIAGNOSIS — R0902 Hypoxemia: Secondary | ICD-10-CM | POA: Diagnosis not present

## 2017-06-05 DIAGNOSIS — J449 Chronic obstructive pulmonary disease, unspecified: Secondary | ICD-10-CM | POA: Diagnosis not present

## 2017-06-07 ENCOUNTER — Other Ambulatory Visit: Payer: Self-pay | Admitting: Family Medicine

## 2017-06-13 ENCOUNTER — Other Ambulatory Visit: Payer: Self-pay | Admitting: Family Medicine

## 2017-06-13 DIAGNOSIS — F419 Anxiety disorder, unspecified: Principal | ICD-10-CM

## 2017-06-13 DIAGNOSIS — F329 Major depressive disorder, single episode, unspecified: Secondary | ICD-10-CM

## 2017-06-13 MED ORDER — SERTRALINE HCL 100 MG PO TABS: ORAL_TABLET | ORAL | 3 refills | Status: DC

## 2017-06-13 NOTE — Telephone Encounter (Signed)
[facility] pharmacy faxed a request for a 90-days supply for the following medication. Thanks [facility]  sertraline (ZOLOFT) 100 MG tablet  >1 tablet,oral,at bedtime daily by mouth

## 2017-06-22 DIAGNOSIS — J449 Chronic obstructive pulmonary disease, unspecified: Secondary | ICD-10-CM | POA: Diagnosis not present

## 2017-07-05 DIAGNOSIS — R0902 Hypoxemia: Secondary | ICD-10-CM | POA: Diagnosis not present

## 2017-07-05 DIAGNOSIS — J449 Chronic obstructive pulmonary disease, unspecified: Secondary | ICD-10-CM | POA: Diagnosis not present

## 2017-07-23 ENCOUNTER — Other Ambulatory Visit: Payer: Self-pay | Admitting: Family Medicine

## 2017-07-23 DIAGNOSIS — J449 Chronic obstructive pulmonary disease, unspecified: Secondary | ICD-10-CM | POA: Diagnosis not present

## 2017-08-05 ENCOUNTER — Other Ambulatory Visit: Payer: Self-pay | Admitting: Family Medicine

## 2017-08-05 DIAGNOSIS — J449 Chronic obstructive pulmonary disease, unspecified: Secondary | ICD-10-CM | POA: Diagnosis not present

## 2017-08-05 DIAGNOSIS — R0902 Hypoxemia: Secondary | ICD-10-CM | POA: Diagnosis not present

## 2017-08-23 DIAGNOSIS — J449 Chronic obstructive pulmonary disease, unspecified: Secondary | ICD-10-CM | POA: Diagnosis not present

## 2017-09-05 DIAGNOSIS — J449 Chronic obstructive pulmonary disease, unspecified: Secondary | ICD-10-CM | POA: Diagnosis not present

## 2017-09-05 DIAGNOSIS — R0902 Hypoxemia: Secondary | ICD-10-CM | POA: Diagnosis not present

## 2017-09-22 DIAGNOSIS — J449 Chronic obstructive pulmonary disease, unspecified: Secondary | ICD-10-CM | POA: Diagnosis not present

## 2017-10-05 DIAGNOSIS — J449 Chronic obstructive pulmonary disease, unspecified: Secondary | ICD-10-CM | POA: Diagnosis not present

## 2017-10-05 DIAGNOSIS — R0902 Hypoxemia: Secondary | ICD-10-CM | POA: Diagnosis not present

## 2017-10-23 DIAGNOSIS — J449 Chronic obstructive pulmonary disease, unspecified: Secondary | ICD-10-CM | POA: Diagnosis not present

## 2017-11-05 DIAGNOSIS — J449 Chronic obstructive pulmonary disease, unspecified: Secondary | ICD-10-CM | POA: Diagnosis not present

## 2017-11-05 DIAGNOSIS — R0902 Hypoxemia: Secondary | ICD-10-CM | POA: Diagnosis not present

## 2017-11-19 ENCOUNTER — Other Ambulatory Visit: Payer: Self-pay | Admitting: Family Medicine

## 2017-11-22 DIAGNOSIS — J449 Chronic obstructive pulmonary disease, unspecified: Secondary | ICD-10-CM | POA: Diagnosis not present

## 2017-12-05 DIAGNOSIS — R0902 Hypoxemia: Secondary | ICD-10-CM | POA: Diagnosis not present

## 2017-12-05 DIAGNOSIS — J449 Chronic obstructive pulmonary disease, unspecified: Secondary | ICD-10-CM | POA: Diagnosis not present

## 2017-12-18 ENCOUNTER — Emergency Department: Payer: Medicare HMO

## 2017-12-18 ENCOUNTER — Other Ambulatory Visit: Payer: Self-pay

## 2017-12-18 ENCOUNTER — Inpatient Hospital Stay
Admission: EM | Admit: 2017-12-18 | Discharge: 2017-12-23 | DRG: 871 | Disposition: A | Discharge status: Home Health | Payer: Medicare HMO | Attending: Internal Medicine | Admitting: Internal Medicine

## 2017-12-18 ENCOUNTER — Encounter: Payer: Self-pay | Admitting: Emergency Medicine

## 2017-12-18 DIAGNOSIS — R778 Other specified abnormalities of plasma proteins: Secondary | ICD-10-CM | POA: Diagnosis not present

## 2017-12-18 DIAGNOSIS — A4152 Sepsis due to Pseudomonas: Secondary | ICD-10-CM | POA: Diagnosis not present

## 2017-12-18 DIAGNOSIS — I10 Essential (primary) hypertension: Secondary | ICD-10-CM | POA: Diagnosis not present

## 2017-12-18 DIAGNOSIS — J9621 Acute and chronic respiratory failure with hypoxia: Secondary | ICD-10-CM | POA: Diagnosis present

## 2017-12-18 DIAGNOSIS — Z7951 Long term (current) use of inhaled steroids: Secondary | ICD-10-CM

## 2017-12-18 DIAGNOSIS — Z720 Tobacco use: Secondary | ICD-10-CM | POA: Diagnosis not present

## 2017-12-18 DIAGNOSIS — J189 Pneumonia, unspecified organism: Secondary | ICD-10-CM

## 2017-12-18 DIAGNOSIS — R6520 Severe sepsis without septic shock: Secondary | ICD-10-CM | POA: Diagnosis present

## 2017-12-18 DIAGNOSIS — Z9981 Dependence on supplemental oxygen: Secondary | ICD-10-CM

## 2017-12-18 DIAGNOSIS — K219 Gastro-esophageal reflux disease without esophagitis: Secondary | ICD-10-CM | POA: Diagnosis present

## 2017-12-18 DIAGNOSIS — E785 Hyperlipidemia, unspecified: Secondary | ICD-10-CM | POA: Diagnosis present

## 2017-12-18 DIAGNOSIS — A280 Pasteurellosis: Secondary | ICD-10-CM | POA: Diagnosis present

## 2017-12-18 DIAGNOSIS — J151 Pneumonia due to Pseudomonas: Secondary | ICD-10-CM | POA: Diagnosis not present

## 2017-12-18 DIAGNOSIS — R0902 Hypoxemia: Secondary | ICD-10-CM

## 2017-12-18 DIAGNOSIS — Z7982 Long term (current) use of aspirin: Secondary | ICD-10-CM | POA: Diagnosis not present

## 2017-12-18 DIAGNOSIS — Z79899 Other long term (current) drug therapy: Secondary | ICD-10-CM | POA: Diagnosis not present

## 2017-12-18 DIAGNOSIS — J181 Lobar pneumonia, unspecified organism: Secondary | ICD-10-CM | POA: Diagnosis present

## 2017-12-18 DIAGNOSIS — J440 Chronic obstructive pulmonary disease with acute lower respiratory infection: Secondary | ICD-10-CM | POA: Diagnosis present

## 2017-12-18 DIAGNOSIS — Z8249 Family history of ischemic heart disease and other diseases of the circulatory system: Secondary | ICD-10-CM

## 2017-12-18 DIAGNOSIS — R0682 Tachypnea, not elsewhere classified: Secondary | ICD-10-CM | POA: Diagnosis not present

## 2017-12-18 DIAGNOSIS — J449 Chronic obstructive pulmonary disease, unspecified: Secondary | ICD-10-CM | POA: Diagnosis not present

## 2017-12-18 DIAGNOSIS — Z833 Family history of diabetes mellitus: Secondary | ICD-10-CM | POA: Diagnosis not present

## 2017-12-18 DIAGNOSIS — R05 Cough: Secondary | ICD-10-CM | POA: Diagnosis not present

## 2017-12-18 DIAGNOSIS — R7989 Other specified abnormal findings of blood chemistry: Secondary | ICD-10-CM

## 2017-12-18 DIAGNOSIS — I248 Other forms of acute ischemic heart disease: Secondary | ICD-10-CM | POA: Diagnosis not present

## 2017-12-18 DIAGNOSIS — I700 Atherosclerosis of aorta: Secondary | ICD-10-CM | POA: Diagnosis not present

## 2017-12-18 DIAGNOSIS — J441 Chronic obstructive pulmonary disease with (acute) exacerbation: Secondary | ICD-10-CM

## 2017-12-18 DIAGNOSIS — A419 Sepsis, unspecified organism: Secondary | ICD-10-CM | POA: Diagnosis present

## 2017-12-18 DIAGNOSIS — R0602 Shortness of breath: Secondary | ICD-10-CM | POA: Diagnosis not present

## 2017-12-18 DIAGNOSIS — F17210 Nicotine dependence, cigarettes, uncomplicated: Secondary | ICD-10-CM | POA: Diagnosis present

## 2017-12-18 DIAGNOSIS — R0600 Dyspnea, unspecified: Secondary | ICD-10-CM | POA: Diagnosis not present

## 2017-12-18 DIAGNOSIS — Z716 Tobacco abuse counseling: Secondary | ICD-10-CM | POA: Diagnosis not present

## 2017-12-18 LAB — CBC WITH DIFFERENTIAL/PLATELET
BASOS PCT: 0 %
Basophils Absolute: 0 10*3/uL (ref 0–0.1)
EOS ABS: 0 10*3/uL (ref 0–0.7)
Eosinophils Relative: 0 %
HCT: 36.4 % (ref 35.0–47.0)
Hemoglobin: 11.8 g/dL — ABNORMAL LOW (ref 12.0–16.0)
LYMPHS PCT: 6 %
Lymphs Abs: 1.4 10*3/uL (ref 1.0–3.6)
MCH: 30.3 pg (ref 26.0–34.0)
MCHC: 32.5 g/dL (ref 32.0–36.0)
MCV: 93.1 fL (ref 80.0–100.0)
MONO ABS: 1.6 10*3/uL — AB (ref 0.2–0.9)
Monocytes Relative: 7 %
Neutro Abs: 21.6 10*3/uL — ABNORMAL HIGH (ref 1.4–6.5)
Neutrophils Relative %: 87 %
Platelets: 339 10*3/uL (ref 150–440)
RBC: 3.91 MIL/uL (ref 3.80–5.20)
RDW: 12.9 % (ref 11.5–14.5)
WBC: 24.7 10*3/uL — AB (ref 3.6–11.0)

## 2017-12-18 LAB — BASIC METABOLIC PANEL
Anion gap: 10 (ref 5–15)
BUN: 14 mg/dL (ref 6–20)
CALCIUM: 9.4 mg/dL (ref 8.9–10.3)
CO2: 31 mmol/L (ref 22–32)
Chloride: 94 mmol/L — ABNORMAL LOW (ref 101–111)
Creatinine, Ser: 0.69 mg/dL (ref 0.44–1.00)
GLUCOSE: 118 mg/dL — AB (ref 65–99)
POTASSIUM: 3.7 mmol/L (ref 3.5–5.1)
SODIUM: 135 mmol/L (ref 135–145)

## 2017-12-18 LAB — BRAIN NATRIURETIC PEPTIDE: B NATRIURETIC PEPTIDE 5: 167 pg/mL — AB (ref 0.0–100.0)

## 2017-12-18 LAB — LACTIC ACID, PLASMA
LACTIC ACID, VENOUS: 1.9 mmol/L (ref 0.5–1.9)
Lactic Acid, Venous: 1.3 mmol/L (ref 0.5–1.9)
Lactic Acid, Venous: 2 mmol/L (ref 0.5–1.9)
Lactic Acid, Venous: 2.8 mmol/L (ref 0.5–1.9)

## 2017-12-18 LAB — EXPECTORATED SPUTUM ASSESSMENT W GRAM STAIN, RFLX TO RESP C: Special Requests: NORMAL

## 2017-12-18 LAB — TROPONIN I
Troponin I: 0.08 ng/mL (ref <0.03)
Troponin I: <0.03 ng/mL (ref <0.03)

## 2017-12-18 LAB — INFLUENZA PANEL BY PCR (TYPE A & B)
INFLAPCR: NEGATIVE
Influenza B By PCR: NEGATIVE

## 2017-12-18 LAB — EXPECTORATED SPUTUM ASSESSMENT W REFEX TO RESP CULTURE

## 2017-12-18 MED ORDER — MOMETASONE FURO-FORMOTEROL FUM 200-5 MCG/ACT IN AERO
2.0000 | INHALATION_SPRAY | Freq: Two times a day (BID) | RESPIRATORY_TRACT | Status: DC
  Administered 2017-12-18 – 2017-12-23 (×10): 2 via RESPIRATORY_TRACT
  Filled 2017-12-18: qty 8.8

## 2017-12-18 MED ORDER — METHYLPREDNISOLONE SODIUM SUCC 125 MG IJ SOLR: 125.0000 mg | Freq: Once | INTRAMUSCULAR | Status: DC

## 2017-12-18 MED ORDER — ADULT MULTIVITAMIN W/MINERALS CH
1.0000 | ORAL_TABLET | Freq: Every day | ORAL | Status: DC
  Administered 2017-12-18 – 2017-12-22 (×5): 1 via ORAL
  Filled 2017-12-18 (×5): qty 1

## 2017-12-18 MED ORDER — TIOTROPIUM BROMIDE MONOHYDRATE 18 MCG IN CAPS
18.0000 ug | ORAL_CAPSULE | Freq: Every day | RESPIRATORY_TRACT | Status: DC
  Administered 2017-12-19 – 2017-12-23 (×5): 18 ug via RESPIRATORY_TRACT
  Filled 2017-12-18 (×2): qty 5

## 2017-12-18 MED ORDER — VANCOMYCIN HCL IN DEXTROSE 1-5 GM/200ML-% IV SOLN
1000.0000 mg | Freq: Once | INTRAVENOUS | Status: AC
  Administered 2017-12-18: 17:00:00 1000 mg via INTRAVENOUS
  Filled 2017-12-18: qty 200

## 2017-12-18 MED ORDER — LORATADINE 10 MG PO TABS
10.0000 mg | ORAL_TABLET | Freq: Every day | ORAL | Status: DC
  Administered 2017-12-19 – 2017-12-22 (×4): 10 mg via ORAL
  Filled 2017-12-18 (×4): qty 1

## 2017-12-18 MED ORDER — VITAMIN D 1000 UNITS PO TABS
2000.0000 [IU] | ORAL_TABLET | Freq: Every day | ORAL | Status: DC
  Administered 2017-12-19 – 2017-12-22 (×4): 2000 [IU] via ORAL
  Filled 2017-12-18 (×3): qty 2

## 2017-12-18 MED ORDER — ALBUTEROL SULFATE (2.5 MG/3ML) 0.083% IN NEBU: 3.0000 mL | INHALATION_SOLUTION | RESPIRATORY_TRACT | Status: DC | PRN

## 2017-12-18 MED ORDER — ACETAMINOPHEN 500 MG PO TABS
1000.0000 mg | ORAL_TABLET | Freq: Four times a day (QID) | ORAL | Status: DC | PRN
  Filled 2017-12-18: qty 2

## 2017-12-18 MED ORDER — VANCOMYCIN HCL IN DEXTROSE 1-5 GM/200ML-% IV SOLN
1000.0000 mg | INTRAVENOUS | Status: DC
  Administered 2017-12-19: 1000 mg via INTRAVENOUS
  Filled 2017-12-18 (×3): qty 200

## 2017-12-18 MED ORDER — IPRATROPIUM-ALBUTEROL 0.5-2.5 (3) MG/3ML IN SOLN
3.0000 mL | Freq: Once | RESPIRATORY_TRACT | Status: AC
  Administered 2017-12-18: 3 mL via RESPIRATORY_TRACT
  Filled 2017-12-18: qty 3

## 2017-12-18 MED ORDER — PIPERACILLIN-TAZOBACTAM 3.375 G IVPB 30 MIN
3.3750 g | Freq: Once | INTRAVENOUS | Status: AC
  Administered 2017-12-18: 15:00:00 3.375 g via INTRAVENOUS
  Filled 2017-12-18: qty 50

## 2017-12-18 MED ORDER — ONDANSETRON HCL 4 MG/2ML IJ SOLN: 4.0000 mg | Freq: Four times a day (QID) | INTRAMUSCULAR | Status: DC | PRN

## 2017-12-18 MED ORDER — SODIUM CHLORIDE 0.9 % IV SOLN
INTRAVENOUS | Status: AC
  Administered 2017-12-18 – 2017-12-19 (×3): via INTRAVENOUS

## 2017-12-18 MED ORDER — ACETAMINOPHEN 650 MG RE SUPP: 650.0000 mg | Freq: Four times a day (QID) | RECTAL | Status: DC | PRN

## 2017-12-18 MED ORDER — ACETAMINOPHEN 325 MG PO TABS
650.0000 mg | ORAL_TABLET | Freq: Four times a day (QID) | ORAL | Status: DC | PRN
  Administered 2017-12-19 – 2017-12-21 (×4): 650 mg via ORAL
  Filled 2017-12-18 (×4): qty 2

## 2017-12-18 MED ORDER — NICOTINE 7 MG/24HR TD PT24
7.0000 mg | MEDICATED_PATCH | Freq: Every day | TRANSDERMAL | Status: DC
Start: 2017-12-18 — End: 2017-12-23
  Filled 2017-12-18 (×3): qty 1

## 2017-12-18 MED ORDER — IPRATROPIUM-ALBUTEROL 0.5-2.5 (3) MG/3ML IN SOLN
3.0000 mL | Freq: Four times a day (QID) | RESPIRATORY_TRACT | Status: DC
  Administered 2017-12-18 – 2017-12-23 (×18): 3 mL via RESPIRATORY_TRACT
  Filled 2017-12-18 (×19): qty 3

## 2017-12-18 MED ORDER — METOPROLOL TARTRATE 25 MG PO TABS
25.0000 mg | ORAL_TABLET | Freq: Two times a day (BID) | ORAL | Status: DC
  Administered 2017-12-18 – 2017-12-22 (×7): 25 mg via ORAL
  Filled 2017-12-18 (×9): qty 1

## 2017-12-18 MED ORDER — SERTRALINE HCL 50 MG PO TABS
100.0000 mg | ORAL_TABLET | Freq: Every day | ORAL | Status: DC
  Administered 2017-12-18 – 2017-12-22 (×5): 100 mg via ORAL
  Filled 2017-12-18 (×5): qty 2

## 2017-12-18 MED ORDER — ALBUTEROL SULFATE (2.5 MG/3ML) 0.083% IN NEBU: 5.0000 mg | INHALATION_SOLUTION | Freq: Once | RESPIRATORY_TRACT | Status: DC

## 2017-12-18 MED ORDER — ATORVASTATIN CALCIUM 20 MG PO TABS
10.0000 mg | ORAL_TABLET | Freq: Every evening | ORAL | Status: DC
  Administered 2017-12-18 – 2017-12-22 (×5): 10 mg via ORAL
  Filled 2017-12-18 (×5): qty 1

## 2017-12-18 MED ORDER — LEVOFLOXACIN IN D5W 750 MG/150ML IV SOLN
750.0000 mg | Freq: Once | INTRAVENOUS | Status: DC
  Administered 2017-12-18: 750 mg via INTRAVENOUS
  Filled 2017-12-18: qty 150

## 2017-12-18 MED ORDER — ASPIRIN 81 MG PO CHEW
324.0000 mg | CHEWABLE_TABLET | Freq: Once | ORAL | Status: AC
  Administered 2017-12-18: 324 mg via ORAL
  Filled 2017-12-18: qty 4

## 2017-12-18 MED ORDER — SODIUM CHLORIDE 0.9 % IV BOLUS (SEPSIS)
500.0000 mL | Freq: Once | INTRAVENOUS | Status: AC
  Administered 2017-12-18: 17:00:00 500 mL via INTRAVENOUS

## 2017-12-18 MED ORDER — LEVOFLOXACIN IN D5W 750 MG/150ML IV SOLN: 750.0000 mg | INTRAVENOUS | Status: DC

## 2017-12-18 MED ORDER — AMLODIPINE BESYLATE 10 MG PO TABS
10.0000 mg | ORAL_TABLET | Freq: Every evening | ORAL | Status: DC
  Administered 2017-12-20 – 2017-12-23 (×4): 10 mg via ORAL
  Filled 2017-12-18 (×5): qty 1

## 2017-12-18 MED ORDER — ENOXAPARIN SODIUM 40 MG/0.4ML ~~LOC~~ SOLN
40.0000 mg | SUBCUTANEOUS | Status: DC
  Administered 2017-12-18 – 2017-12-22 (×5): 40 mg via SUBCUTANEOUS
  Filled 2017-12-18 (×5): qty 0.4

## 2017-12-18 MED ORDER — ONDANSETRON HCL 4 MG PO TABS: 4.0000 mg | ORAL_TABLET | Freq: Four times a day (QID) | ORAL | Status: DC | PRN

## 2017-12-18 MED ORDER — ASPIRIN 81 MG PO CHEW
81.0000 mg | CHEWABLE_TABLET | Freq: Every day | ORAL | Status: DC
  Administered 2017-12-18 – 2017-12-22 (×5): 81 mg via ORAL
  Filled 2017-12-18 (×5): qty 1

## 2017-12-18 MED ORDER — VITAMIN E 180 MG (400 UNIT) PO CAPS
400.0000 [IU] | ORAL_CAPSULE | Freq: Every day | ORAL | Status: DC
  Administered 2017-12-19 – 2017-12-22 (×4): 400 [IU] via ORAL
  Filled 2017-12-18 (×5): qty 1

## 2017-12-18 NOTE — H&P (Signed)
[facility] [place] at [facility]   PATIENT NAME: Hailey SaaJuanita Gibbs    MR#:  [id number]  DATE OF BIRTH:  [birth year]  DATE OF ADMISSION:  12/18/2017  PRIMARY CARE PHYSICIAN: Chrismon, Jodell Ciproennis E, PA   REQUESTING/REFERRING PHYSICIAN: Emily FilbertWilliams, Jonathan E, MD  CHIEF COMPLAINT:   sob HISTORY OF PRESENT ILLNESS:  Hailey SaaJuanita Diodato  is a 72 y.o. female with a known history of chronic COPD, chronic hypoxic respiratory failure and lives on 2 L of oxygen still continues to smoke is presenting to the ED with a chief complaint of worsening shortness of breath for the past 2 days.  The patient was hypoxemic on 2 L saturating at 88% requiring 4 L of oxygen.  Patient was reporting productive cough and could not sleep last night due to shortness of breath.  Chest x-ray has revealed left lower lobe pneumonia.  Patient is started on antibiotics and hospitalist team was called to admit the patient.  Patient was tachycardic and white blood cell count is elevated.  Troponin is elevated but patient denies any chest pain while resting, but reports chest pain while coughing  PAST MEDICAL HISTORY:   Past Medical History:  Diagnosis Date  . Anxiety   . Carotid artery occlusion   . COPD (chronic obstructive pulmonary disease) (HCC)   . Depression   . GERD (gastroesophageal reflux disease)   . Hyperlipidemia   . Hypertension   . Seasonal allergies   . Shortness of breath dyspnea     PAST SURGICAL HISTOIRY:   Past Surgical History:  Procedure Laterality Date  . ENDARTERECTOMY Left 06/23/2015   Procedure: ENDARTERECTOMY CAROTID;  Surgeon: Annice NeedyJason S Dew, MD;  Location: [facility] ORS;  Service: Vascular;  Laterality: Left;  . TUBAL LIGATION    . VAGINAL HYSTERECTOMY      SOCIAL HISTORY:   Social History   Tobacco Use  . Smoking status: Current Every Day Smoker    Packs/day: 0.50    Years: 45.00    Pack years: 22.50    Types: Cigarettes  . Smokeless tobacco: Never Used  . Tobacco  comment: pt uses nictine patches occasioanlly   Substance Use Topics  . Alcohol use: No    Alcohol/week: 0.0 oz    FAMILY HISTORY:   Family History  Problem Relation Age of Onset  . Heart disease Father 3275       CABG   . Hyperlipidemia Father   . Pneumonia Father   . Dementia Mother   . COPD Mother   . Diabetes Sister   . Parkinson's disease Brother   . Post-traumatic stress disorder Son     DRUG ALLERGIES:   Allergies  Allergen Reactions  . Codeine Anxiety         REVIEW OF SYSTEMS:  CONSTITUTIONAL: No fever, fatigue or weakness.  EYES: No blurred or double vision.  EARS, NOSE, AND THROAT: No tinnitus or ear pain.  RESPIRATORY: Reporting worsening of productive cough, shortness of breath for 2 days, denies wheezing or hemoptysis.  CARDIOVASCULAR: No chest pain, orthopnea, edema.  GASTROINTESTINAL: No nausea, vomiting, diarrhea or abdominal pain.  GENITOURINARY: No dysuria, hematuria.  ENDOCRINE: No polyuria, nocturia,  HEMATOLOGY: No anemia, easy bruising or bleeding SKIN: No rash or lesion. MUSCULOSKELETAL: No joint pain or arthritis.   NEUROLOGIC: No tingling, numbness, weakness.  PSYCHIATRY: No anxiety or depression.   MEDICATIONS AT HOME:   Prior to Admission medications   Medication Sig Start Date End Date Taking? Authorizing Provider  amLODipine (  NORVASC) 10 MG tablet TAKE 1 TABLET (10 MG TOTAL) BY MOUTH DAILY. 06/07/17  Yes Chrismon, Jodell Cipro, PA  aspirin 81 MG chewable tablet Chew 81 mg by mouth at bedtime.    Yes [provider]  atorvastatin (LIPITOR) 10 MG tablet Take 1 tablet (10 mg total) by mouth daily. 06/07/17  Yes Chrismon, Jodell Cipro, PA  cetirizine (ZYRTEC) 10 MG tablet Take 10 mg by mouth at bedtime.    Yes [provider]  Cholecalciferol (VITAMIN D3) 2000 units TABS Take by mouth daily.   Yes [provider]  metoprolol tartrate (LOPRESSOR) 25 MG tablet Take 1 tablet (25 mg total) by mouth 2 (two) times daily. Patient  taking differently: Take 25 mg by mouth daily.  11/10/16  Yes Shaune Pollack, MD  Multiple Vitamin (MULTIVITAMIN) tablet Take 1 tablet by mouth at bedtime.    Yes [provider]  sertraline (ZOLOFT) 100 MG tablet 1 (ONE) TABLET, ORAL, AT BEDTIME DAILY BY MOUTH 06/13/17  Yes Chrismon, Jodell Cipro, PA  SPIRIVA HANDIHALER 18 MCG inhalation capsule INHALE 1 CAPSULE ONCE A DAY 01/10/17  Yes Chrismon, Jodell Cipro, PA  SYMBICORT 160-4.5 MCG/ACT inhaler INHALE 2 PUFFS INTO THE LUNGS 2 (TWO) TIMES DAILY. 11/19/17  Yes Chrismon, Jodell Cipro, PA  VENTOLIN HFA 108 (90 Base) MCG/ACT inhaler INHALE 1-2 PUFFS 4 TIMES A DAY AS NEEDED FOR WHEEZING 07/23/17  Yes Chrismon, Jodell Cipro, PA  vitamin E 400 UNIT capsule Take 400 Units by mouth daily.   Yes [provider]  acetaminophen (TYLENOL) 500 MG tablet Take 1,000 mg by mouth every 6 (six) hours as needed for mild pain or headache.     [provider]      VITAL SIGNS:  Pulse (!) 111, temperature 99.4 F (37.4 C), resp. rate (!) 24, height 5\' 5"  (1.651 m), weight 63 kg (139 lb), SpO2 92 %.  PHYSICAL EXAMINATION:  GENERAL:  72 y.o.-year-old patient lying in the bed with no acute distress.  EYES: Pupils equal, round, reactive to light and accommodation. No scleral icterus. Extraocular muscles intact.  HEENT: Head atraumatic, normocephalic. Oropharynx and nasopharynx clear.  NECK:  Supple, no jugular venous distention. No thyroid enlargement, no tenderness.  LUNGS: Moderate breath sounds bilaterally, minimal intermittent wheezing, no rales,rhonchi.  Has left-sided crepitation. No use of accessory muscles of respiration.  CARDIOVASCULAR: S1, S2 normal. No murmurs, rubs, or gallops.  ABDOMEN: Soft, nontender, nondistended. Bowel sounds present. No organomegaly or mass.  EXTREMITIES: No pedal edema, cyanosis, or clubbing.  NEUROLOGIC: Cranial nerves II through XII are intact. Muscle strength 5/5 in all extremities. Sensation intact. Gait not checked.   PSYCHIATRIC: The patient is alert and oriented x 3.  SKIN: No obvious rash, lesion, or ulcer.   LABORATORY PANEL:   CBC Recent Labs  Lab 12/18/17 1132  WBC 24.7*  HGB 11.8*  HCT 36.4  PLT 339   ------------------------------------------------------------------------------------------------------------------  Chemistries  Recent Labs  Lab 12/18/17 1132  NA 135  K 3.7  CL 94*  CO2 31  GLUCOSE 118*  BUN 14  CREATININE 0.69  CALCIUM 9.4   ------------------------------------------------------------------------------------------------------------------  Cardiac Enzymes Recent Labs  Lab 12/18/17 1132  TROPONINI 0.08*   ------------------------------------------------------------------------------------------------------------------  RADIOLOGY:  Dg Chest 2 View  Result Date: 12/18/2017 CLINICAL DATA:  Two days of shortness of breath and 1 week of productive cough. On home oxygen. History of COPD. EXAM: CHEST  2 VIEW COMPARISON:  Chest x-ray of November 07, 2016 FINDINGS: The lungs are adequately inflated.  There is chronic prominence of the right hilum. There new increased density at the left lung base posteriorly. The interstitial markings are coarse though stable. The heart and pulmonary vascularity are normal. There is dense calcification in the wall of the thoracic aorta. The bony thorax exhibits no acute abnormality. There is chronic wedge compression of L1 or L2. IMPRESSION: COPD. Left lower lobe atelectasis or early pneumonia. No CHF. Followup PA and lateral chest X-ray is recommended in 3-4 weeks following trial of antibiotic therapy to ensure resolution and exclude underlying malignancy. Thoracic aortic atherosclerosis. Electronically Signed   By: David  [place] M.D.   On: 12/18/2017 12:11    EKG:   Orders placed or performed during the hospital encounter of 12/18/17  . ED EKG  . ED EKG    IMPRESSION AND PLAN:   Hailey Gibbs  is a 72 y.o. female with a known  history of chronic COPD, chronic hypoxic respiratory failure and lives on 2 L of oxygen still continues to smoke is presenting to the ED with a chief complaint of worsening shortness of breath for the past 2 days.  The patient was hypoxemic on 2 L saturating at 88% requiring 4 L of oxygen.  Patient was reporting productive cough and could not sleep last night due to shortness of breath.  Chest x-ray has revealed left lower lobe pneumonia.  #Sepsis secondary to left lower lobe pneumonia Admit to MedSurg floor Patient meets septic criteria with leukocytosis and tachycardia at the time of admission Sputum culture and sensitivity Follow-up on blood cultures Broad-spectrum antibiotics Zosyn vancomycin Flu test is negative IV fluids Follow-up in the lactic acid level and pro calcitonin levels  #Acute on chronic hypoxic respiratory failure secondary to pneumonia Continue oxygen via nasal cannula, wean off as tolerated Bronchodilator therapy Continue antibiotics  #Elevated troponin could be demand ischemia Monitor troponins and monitor patient on telemetry  #Essential hypertension Continue home medication amlodipine and metoprolol   #chronic history of COPD no exacerbation at this time Provide bronchodilators as needed basis Continue home bronchodilators   #Tobacco abuse disorder Counseled patient to stop smoking for 4-5 minutes ,we will provide nicotine patch   DVT prophylaxis    All the records are reviewed and case discussed with ED provider. Management plans discussed with the patient, family and they are in agreement.  CODE STATUS: fc, son Maudy Yonan is  healthcare power of attorney  TOTAL TIME TAKING CARE OF THIS PATIENT: 45 minutes.   Note: This dictation was prepared with Dragon dictation along with smaller phrase technology. Any transcriptional errors that result from this process are unintentional.  Ramonita Lab M.D on 12/18/2017 at 1:32 PM  Between 7am to 6pm -  Pager - [phone]  After 6pm go to [email or web address] - password [email or web address] [facility]  [place] [facility]  Office  [phone]  CC: Primary care physician; Tamsen Roers, PA

## 2017-12-18 NOTE — Progress Notes (Signed)
ANTIBIOTIC CONSULT NOTE - INITIAL  Pharmacy Consult for Levofloxacin Indication: pneumonia  Allergies  Allergen Reactions  . Codeine Anxiety         Patient Measurements: Height: 5\' 5"  (165.1 cm) Weight: 139 lb (63 kg) IBW/kg (Calculated) : 57 Adjusted Body Weight:   Vital Signs: Temp: 99.4 F (37.4 C) (01/09 1132) Pulse Rate: 111 (01/09 1132) Intake/Output from previous day: No intake/output data recorded. Intake/Output from this shift: No intake/output data recorded.  Labs: Recent Labs    12/18/17 1132  WBC 24.7*  HGB 11.8*  PLT 339  CREATININE 0.69   Estimated Creatinine Clearance: 58.9 mL/min (by C-G formula based on SCr of 0.69 mg/dL). No results for input(s): VANCOTROUGH, VANCOPEAK, VANCORANDOM, GENTTROUGH, GENTPEAK, GENTRANDOM, TOBRATROUGH, TOBRAPEAK, TOBRARND, AMIKACINPEAK, AMIKACINTROU, AMIKACIN in the last 72 hours.   Microbiology: No results found for this or any previous visit (from the past 720 hour(s)).  Medical History: Past Medical History:  Diagnosis Date  . Anxiety   . Carotid artery occlusion   . COPD (chronic obstructive pulmonary disease) (HCC)   . Depression   . GERD (gastroesophageal reflux disease)   . Hyperlipidemia   . Hypertension   . Seasonal allergies   . Shortness of breath dyspnea     Medications:   (Not in a hospital admission) Scheduled:   Assessment: Pharmacy consulted to dose and monitor Levofloxacin in this 72 year old female being treated for shortness of breath with history of COPD being diagnosed with pneumonia  Goal of Therapy:    Plan:  Will start levofloxacin 750 mg IV q24 hours.   Denaly Gatling D 12/18/2017,1:28 PM

## 2017-12-18 NOTE — Progress Notes (Signed)
ANTIBIOTIC CONSULT NOTE - INITIAL  Pharmacy Consult for Vancomycin and Zosyn  Indication: sepsis  Allergies  Allergen Reactions  . Codeine Anxiety         Patient Measurements: Height: 5\' 5"  (165.1 cm) Weight: 139 lb (63 kg) IBW/kg (Calculated) : 57 Adjusted Body Weight:   Vital Signs: Temp: 99.4 F (37.4 C) (01/09 1132) BP: 142/65 (01/09 1400) Pulse Rate: 111 (01/09 1400) Intake/Output from previous day: No intake/output data recorded. Intake/Output from this shift: No intake/output data recorded.  Labs: Recent Labs    12/18/17 1132  WBC 24.7*  HGB 11.8*  PLT 339  CREATININE 0.69   Estimated Creatinine Clearance: 58.9 mL/min (by C-G formula based on SCr of 0.69 mg/dL). No results for input(s): VANCOTROUGH, VANCOPEAK, VANCORANDOM, GENTTROUGH, GENTPEAK, GENTRANDOM, TOBRATROUGH, TOBRAPEAK, TOBRARND, AMIKACINPEAK, AMIKACINTROU, AMIKACIN in the last 72 hours.   Microbiology: No results found for this or any previous visit (from the past 720 hour(s)).  Medical History: Past Medical History:  Diagnosis Date  . Anxiety   . Carotid artery occlusion   . COPD (chronic obstructive pulmonary disease) (HCC)   . Depression   . GERD (gastroesophageal reflux disease)   . Hyperlipidemia   . Hypertension   . Seasonal allergies   . Shortness of breath dyspnea     Medications:  Medications Prior to Admission  Medication Sig Dispense Refill Last Dose  . amLODipine (NORVASC) 10 MG tablet TAKE 1 TABLET (10 MG TOTAL) BY MOUTH DAILY. 90 tablet 3 12/17/2017 at Unknown time  . aspirin 81 MG chewable tablet Chew 81 mg by mouth at bedtime.    Past Week at Unknown time  . atorvastatin (LIPITOR) 10 MG tablet Take 1 tablet (10 mg total) by mouth daily. 90 tablet 3 12/17/2017 at Unknown time  . cetirizine (ZYRTEC) 10 MG tablet Take 10 mg by mouth at bedtime.    Past Week at Unknown time  . Cholecalciferol (VITAMIN D3) 2000 units TABS Take by mouth daily.   Past Week at Unknown time  .  metoprolol tartrate (LOPRESSOR) 25 MG tablet Take 1 tablet (25 mg total) by mouth 2 (two) times daily. (Patient taking differently: Take 25 mg by mouth daily. ) 60 tablet 0 Past Week at Unknown time  . Multiple Vitamin (MULTIVITAMIN) tablet Take 1 tablet by mouth at bedtime.    Past Week at Unknown time  . sertraline (ZOLOFT) 100 MG tablet 1 (ONE) TABLET, ORAL, AT BEDTIME DAILY BY MOUTH 90 tablet 3 Past Week at Unknown time  . SPIRIVA HANDIHALER 18 MCG inhalation capsule INHALE 1 CAPSULE ONCE A DAY 30 capsule 11 12/18/2017 at Unknown time  . SYMBICORT 160-4.5 MCG/ACT inhaler INHALE 2 PUFFS INTO THE LUNGS 2 (TWO) TIMES DAILY. 10.2 Inhaler 3 12/18/2017 at Unknown time  . VENTOLIN HFA 108 (90 Base) MCG/ACT inhaler INHALE 1-2 PUFFS 4 TIMES A DAY AS NEEDED FOR WHEEZING 18 Inhaler 2 12/18/2017 at Unknown time  . vitamin E 400 UNIT capsule Take 400 Units by mouth daily.   Past Week at Unknown time  . acetaminophen (TYLENOL) 500 MG tablet Take 1,000 mg by mouth every 6 (six) hours as needed for mild pain or headache.    prn at prn   Scheduled:  . amLODipine  10 mg Oral QPM  . aspirin  81 mg Oral QHS  . atorvastatin  10 mg Oral QPM  . [START ON 12/19/2017] cholecalciferol  2,000 Units Oral Daily  . enoxaparin (LOVENOX) injection  40 mg Subcutaneous Q24H  .  ipratropium-albuterol  3 mL Nebulization Q6H  . [START ON 12/19/2017] loratadine  10 mg Oral Daily  . metoprolol tartrate  25 mg Oral BID  . mometasone-formoterol  2 puff Inhalation BID  . multivitamin with minerals  1 tablet Oral QHS  . nicotine  7 mg Transdermal Daily  . sertraline  100 mg Oral QHS  . [START ON 12/19/2017] tiotropium  18 mcg Inhalation Daily  . [START ON 12/19/2017] vitamin E  400 Units Oral Daily   Infusions:  . piperacillin-tazobactam    . vancomycin    . [START ON 12/19/2017] vancomycin     Assessment: Pharmacy consulted to dose and monitor Vancomycin and Zosyn in this patient being treated for sepsis secondary to pneumonia    Goal of Therapy:  VT 15-20  Plan:  Will give Vancomycin 1 g IV x 1 then will start Vancomycin 1 g IV q18 hours @ 0000 on 1/10.   Zosyn: Will start Zosyn 3.375 g IV q8 hours.   Kerby Borner D 12/18/2017,2:24 PM

## 2017-12-18 NOTE — ED Triage Notes (Signed)
Pt via ems from home with sob x 2 days. Pt has hx of copd and wears 2L chronically. Pt 88% when fd arrived; she is now on 4L and satting 95%. Pt alert & oriented with NAD Noted.

## 2017-12-18 NOTE — ED Notes (Signed)
Ems gave 125 solumedrol and 1 duoneb treatment en route

## 2017-12-18 NOTE — ED Notes (Signed)
Pt presents with body aches, dropping O2 sats x 2 days. Pt states she did not have a flu shot. Pt alert & oriented, inspiratory and expiratory wheezes on right side, diminished on left. NAD Noted.

## 2017-12-18 NOTE — ED Provider Notes (Signed)
[facility] Emergency Department Provider Note       Time seen: ----------------------------------------- 11:45 AM on 12/18/2017 -----------------------------------------   I have reviewed the triage vital signs and the nursing notes.  HISTORY   Chief Complaint Shortness of Breath    HPI Hailey Gibbs is a 72 y.o. female with a history of COPD who presents to the ED for shortness of breath for the past 2 days.  Patient has a history of COPD and wears 2 L of oxygen chronically.  Patient was 88% when the fire department arrived and she was 95% on 4 L.  She has had some productive cough and states she could not sleep last night due to being short of breath and coughing.  She reports has been a year since she has been diagnosed with pneumonia.  Nothing makes her symptoms better.  Past Medical History:  Diagnosis Date  . Anxiety   . Carotid artery occlusion   . COPD (chronic obstructive pulmonary disease) (HCC)   . Depression   . GERD (gastroesophageal reflux disease)   . Hyperlipidemia   . Hypertension   . Seasonal allergies   . Shortness of breath dyspnea     Patient Active Problem List   Diagnosis Date Noted  . Sepsis (HCC) 11/07/2016  . Chronic obstructive pulmonary disease with hypoxia (HCC) 10/26/2016  . Abnormal chest sounds 08/29/2015  . Obstructive chronic bronchitis with exacerbation (HCC) 08/29/2015  . Anxiety and depression 08/29/2015  . BP (high blood pressure) 08/29/2015  . Carotid stenosis 06/23/2015  . Pre-operative cardiovascular examination 05/30/2015  . Tobacco use 05/30/2015  . Hyperlipidemia     Past Surgical History:  Procedure Laterality Date  . ENDARTERECTOMY Left 06/23/2015   Procedure: ENDARTERECTOMY CAROTID;  Surgeon: Annice Needy, MD;  Location: [facility] ORS;  Service: Vascular;  Laterality: Left;  . TUBAL LIGATION    . VAGINAL HYSTERECTOMY      Allergies Codeine  Social History Social History   Tobacco Use  .  Smoking status: Current Every Day Smoker    Packs/day: 0.50    Years: 45.00    Pack years: 22.50    Types: Cigarettes  . Smokeless tobacco: Never Used  . Tobacco comment: pt uses nictine patches occasioanlly   Substance Use Topics  . Alcohol use: No    Alcohol/week: 0.0 oz  . Drug use: No    Review of Systems Constitutional: Negative for fever. Cardiovascular: Negative for chest pain. Respiratory: Positive for shortness of breath and cough Gastrointestinal: Negative for abdominal pain, vomiting and diarrhea. Genitourinary: Negative for dysuria. Musculoskeletal: Negative for back pain. Skin: Negative for rash. Neurological: Positive for weakness  All systems negative/normal/unremarkable except as stated in the HPI  ____________________________________________   PHYSICAL EXAM:  VITAL SIGNS: ED Triage Vitals  Enc Vitals Group     BP --      Pulse Rate 12/18/17 1132 (!) 111     Resp 12/18/17 1132 (!) 24     Temp 12/18/17 1132 99.4 F (37.4 C)     Temp src --      SpO2 12/18/17 1132 92 %     Weight 12/18/17 1134 139 lb (63 kg)     Height 12/18/17 1134 5\' 5"  (1.651 m)     Head Circumference --      Peak Flow --      Pain Score 12/18/17 1130 2     Pain Loc --      Pain Edu? --  Excl. in GC? --     Constitutional: Alert and oriented.  Mild distress Eyes: Conjunctivae are normal. Normal extraocular movements. ENT   Head: Normocephalic and atraumatic.   Nose: No congestion/rhinnorhea.   Mouth/Throat: Mucous membranes are moist.   Neck: No stridor. Cardiovascular: Normal rate, regular rhythm. No murmurs, rubs, or gallops. Respiratory: Mild tachypnea, bilateral wheezing and rhonchi Gastrointestinal: Soft and nontender. Normal bowel sounds Musculoskeletal: Nontender with normal range of motion in extremities. No lower extremity tenderness nor edema. Neurologic:  Normal speech and language. No gross focal neurologic deficits are appreciated.  Skin:   Skin is warm, dry and intact. No rash noted. Psychiatric: Mood and affect are normal. Speech and behavior are normal.  ____________________________________________  EKG: Interpreted by me.  Sinus tachycardia with a rate of 110 bpm, normal PR interval, normal QRS, normal QT.  ____________________________________________  ED COURSE:  As part of my medical decision making, I reviewed the following data within the electronic MEDICAL RECORD NUMBER History obtained from family if available, nursing notes, old chart and ekg, as well as notes from prior ED visits. Patient presented for shortness of breath and likely COPD exacerbation, we will assess with labs and imaging as indicated at this time.   Procedures ____________________________________________   LABS (pertinent positives/negatives)  Labs Reviewed  CBC WITH DIFFERENTIAL/PLATELET - Abnormal; Notable for the following components:      Result Value   WBC 24.7 (*)    Hemoglobin 11.8 (*)    Neutro Abs 21.6 (*)    Monocytes Absolute 1.6 (*)    All other components within normal limits  BASIC METABOLIC PANEL - Abnormal; Notable for the following components:   Chloride 94 (*)    Glucose, Bld 118 (*)    All other components within normal limits  BRAIN NATRIURETIC PEPTIDE - Abnormal; Notable for the following components:   B Natriuretic Peptide 167.0 (*)    All other components within normal limits  TROPONIN I - Abnormal; Notable for the following components:   Troponin I 0.08 (*)    All other components within normal limits  CULTURE, BLOOD (ROUTINE X 2)  CULTURE, BLOOD (ROUTINE X 2)  INFLUENZA PANEL BY PCR (TYPE A & B)  LACTIC ACID, PLASMA  LACTIC ACID, PLASMA   CRITICAL CARE Performed by: Emily Filbert   Total critical care time: 30 minutes  Critical care time was exclusive of separately billable procedures and treating other patients.  Critical care was necessary to treat or prevent imminent or life-threatening  deterioration.  Critical care was time spent personally by me on the following activities: development of treatment plan with patient and/or surrogate as well as nursing, discussions with consultants, evaluation of patient's response to treatment, examination of patient, obtaining history from patient or surrogate, ordering and performing treatments and interventions, ordering and review of laboratory studies, ordering and review of radiographic studies, pulse oximetry and re-evaluation of patient's condition.  RADIOLOGY  Chest x-ray IMPRESSION: COPD. Left lower lobe atelectasis or early pneumonia. No CHF. Followup PA and lateral chest X-ray is recommended in 3-4 weeks following trial of antibiotic therapy to ensure resolution and exclude underlying malignancy.  Thoracic aortic atherosclerosis.  ____________________________________________  DIFFERENTIAL DIAGNOSIS   Influenza, pneumonia, URI, PE, pneumothorax  FINAL ASSESSMENT AND PLAN  Pneumonia and possibly early sepsis, COPD exacerbation, hypoxia, elevated troponin   Plan: Patient had presented for shortness of breath with prehospital hypoxia. Patient's labs were concerning for market leukocytosis with neutrophilic predominance suggesting bacterial infection as identified  on chest x-ray with likely pneumonia. Patient's imaging again was concerning for left lower lobe pneumonia.  She was negative for influenza but has an elevated troponin likely demand related.  She would benefit from hospital observation.  We have ordered IV Levaquin and given aspirin for the elevated troponin.   Emily FilbertWilliams, Jonathan E, MD   Note: This note was generated in part or whole with voice recognition software. Voice recognition is usually quite accurate but there are transcription errors that can and very often do occur. I apologize for any typographical errors that were not detected and corrected.     Emily FilbertWilliams, Jonathan E, MD 12/18/17 [phone]

## 2017-12-19 ENCOUNTER — Inpatient Hospital Stay: Payer: Medicare HMO

## 2017-12-19 LAB — COMPREHENSIVE METABOLIC PANEL
ALBUMIN: 3.3 g/dL — AB (ref 3.5–5.0)
ALT: 12 U/L — AB (ref 14–54)
AST: 23 U/L (ref 15–41)
Alkaline Phosphatase: 95 U/L (ref 38–126)
Anion gap: 7 (ref 5–15)
BUN: 12 mg/dL (ref 6–20)
CHLORIDE: 100 mmol/L — AB (ref 101–111)
CO2: 32 mmol/L (ref 22–32)
CREATININE: 0.58 mg/dL (ref 0.44–1.00)
Calcium: 9.2 mg/dL (ref 8.9–10.3)
GFR calc Af Amer: >60 mL/min (ref >60)
GFR calc non Af Amer: >60 mL/min (ref >60)
Glucose, Bld: 173 mg/dL — ABNORMAL HIGH (ref 65–99)
POTASSIUM: 4.1 mmol/L (ref 3.5–5.1)
Sodium: 139 mmol/L (ref 135–145)
Total Bilirubin: 0.5 mg/dL (ref 0.3–1.2)
Total Protein: 6.8 g/dL (ref 6.5–8.1)

## 2017-12-19 LAB — CBC
HCT: 33.9 % — ABNORMAL LOW (ref 35.0–47.0)
Hemoglobin: 11 g/dL — ABNORMAL LOW (ref 12.0–16.0)
MCH: 30.2 pg (ref 26.0–34.0)
MCHC: 32.4 g/dL (ref 32.0–36.0)
MCV: 93.4 fL (ref 80.0–100.0)
PLATELETS: 311 10*3/uL (ref 150–440)
RBC: 3.63 MIL/uL — AB (ref 3.80–5.20)
RDW: 13.1 % (ref 11.5–14.5)
WBC: 17.4 10*3/uL — AB (ref 3.6–11.0)

## 2017-12-19 LAB — MRSA PCR SCREENING: MRSA by PCR: NEGATIVE

## 2017-12-19 LAB — TROPONIN I

## 2017-12-19 MED ORDER — PIPERACILLIN-TAZOBACTAM 3.375 G IVPB
3.3750 g | Freq: Three times a day (TID) | INTRAVENOUS | Status: AC
  Administered 2017-12-19 – 2017-12-22 (×10): 3.375 g via INTRAVENOUS
  Filled 2017-12-19 (×10): qty 50

## 2017-12-19 MED ORDER — IOPAMIDOL (ISOVUE-370) INJECTION 76%
75.0000 mL | Freq: Once | INTRAVENOUS | Status: AC | PRN
  Administered 2017-12-19: 12:00:00 75 mL via INTRAVENOUS

## 2017-12-19 NOTE — Progress Notes (Signed)
Patient requested attempting to wear nasal cannula again. O2 saturation high 90s oh HFNC. SVN given. Placed patient on 6 liters. Recheck after 10 mins..saturation low 80s. Placed patient back on HFNC. Will continue to wean as tolerated.

## 2017-12-19 NOTE — Plan of Care (Signed)
  Education: Knowledge of General Education information will improve 12/19/2017 0507 - Progressing by Jeffie Pollock, RN   Health Behavior/Discharge Planning: Ability to manage health-related needs will improve 12/19/2017 0507 - Progressing by Jeffie Pollock, RN   Clinical Measurements: Ability to maintain clinical measurements within normal limits will improve 12/19/2017 0507 - Progressing by Jeffie Pollock, RN Will remain free from infection 12/19/2017 0507 - Progressing by Jeffie Pollock, RN Diagnostic test results will improve 12/19/2017 0507 - Progressing by Jeffie Pollock, RN Respiratory complications will improve 12/19/2017 0507 - Progressing by Jeffie Pollock, RN Cardiovascular complication will be avoided 12/19/2017 0507 - Progressing by Jeffie Pollock, RN   Activity: Risk for activity intolerance will decrease 12/19/2017 0507 - Progressing by Jeffie Pollock, RN   Nutrition: Adequate nutrition will be maintained 12/19/2017 0507 - Progressing by Jeffie Pollock, RN   Coping: Level of anxiety will decrease 12/19/2017 0507 - Completed/Met by Jeffie Pollock, RN   Elimination: Will not experience complications related to bowel motility 12/19/2017 0507 - Completed/Met by Jeffie Pollock, RN Will not experience complications related to urinary retention 12/19/2017 0507 - Completed/Met by Jeffie Pollock, RN   Pain Managment: General experience of comfort will improve 12/19/2017 0507 - Progressing by Jeffie Pollock, RN   Safety: Ability to remain free from injury will improve 12/19/2017 0507 - Completed/Met by Jeffie Pollock, RN   Skin Integrity: Risk for impaired skin integrity will decrease 12/19/2017 0507 - Completed/Met by Jeffie Pollock, RN

## 2017-12-19 NOTE — Progress Notes (Signed)
[facility] - [place] at [facility]   PATIENT NAME: Hailey Gibbs    MR#:  [id number]  DATE OF BIRTH:  [birth year]  SUBJECTIVE:  CHIEF COMPLAINT:   Chief Complaint  Patient presents with  . Shortness of Breath  not feeling better,still needing 6 liters, SOB REVIEW OF SYSTEMS:  Review of Systems  Constitutional: Negative for chills, fever and weight loss.  HENT: Negative for nosebleeds and sore throat.   Eyes: Negative for blurred vision.  Respiratory: Positive for cough and shortness of breath. Negative for wheezing.   Cardiovascular: Negative for chest pain, orthopnea, leg swelling and PND.  Gastrointestinal: Negative for abdominal pain, constipation, diarrhea, heartburn, nausea and vomiting.  Genitourinary: Negative for dysuria and urgency.  Musculoskeletal: Negative for back pain.  Skin: Negative for rash.  Neurological: Negative for dizziness, speech change, focal weakness and headaches.  Endo/Heme/Allergies: Does not bruise/bleed easily.  Psychiatric/Behavioral: Negative for depression.   DRUG ALLERGIES:   Allergies  Allergen Reactions  . Codeine Anxiety        VITALS:  Blood pressure (!) 114/54, pulse 82, temperature 98.6 F (37 C), temperature source Oral, resp. rate 18, height 5\' 5"  (1.651 m), weight 63.2 kg (139 lb 6.4 oz), SpO2 93 %. PHYSICAL EXAMINATION:  Physical Exam  Constitutional: She is oriented to person, place, and time and well-developed, well-nourished, and in no distress.  HENT:  Head: Normocephalic and atraumatic.  Eyes: Conjunctivae and EOM are normal. Pupils are equal, round, and reactive to light.  Neck: Normal range of motion. Neck supple. No tracheal deviation present. No thyromegaly present.  Cardiovascular: Normal rate, regular rhythm and normal heart sounds.  Pulmonary/Chest: Breath sounds normal. She is in respiratory distress. She has no wheezes. She exhibits no tenderness.  Abdominal: Soft. Bowel sounds are normal.  She exhibits no distension. There is no tenderness.  Musculoskeletal: Normal range of motion.  Neurological: She is alert and oriented to person, place, and time. No cranial nerve deficit.  Skin: Skin is warm and dry. No rash noted.  Psychiatric: Mood and affect normal.   LABORATORY PANEL:  Female CBC Recent Labs  Lab 12/19/17 0154  WBC 17.4*  HGB 11.0*  HCT 33.9*  PLT 311   ------------------------------------------------------------------------------------------------------------------ Chemistries  Recent Labs  Lab 12/19/17 0154  NA 139  K 4.1  CL 100*  CO2 32  GLUCOSE 173*  BUN 12  CREATININE 0.58  CALCIUM 9.2  AST 23  ALT 12*  ALKPHOS 95  BILITOT 0.5   RADIOLOGY:  Ct Angio Chest Pe W Or Wo Contrast  Result Date: 12/19/2017 CLINICAL DATA:  COPD. Dyspnea, worsening for 2 days. Hypoxemia. Productive cough. Left lower lobe pneumonia on chest radiograph. EXAM: CT ANGIOGRAPHY CHEST WITH CONTRAST TECHNIQUE: Multidetector CT imaging of the chest was performed using the standard protocol during bolus administration of intravenous contrast. Multiplanar CT image reconstructions and MIPs were obtained to evaluate the vascular anatomy. CONTRAST:  75mL ISOVUE-370 IOPAMIDOL (ISOVUE-370) INJECTION 76% COMPARISON:  Chest radiograph from one day prior. 03/31/2012 chest CT. FINDINGS: Cardiovascular: The study is high quality for the evaluation of pulmonary embolism. There are no filling defects in the central, lobar, segmental or subsegmental pulmonary artery branches to suggest acute pulmonary embolism. Atherosclerotic nonaneurysmal thoracic aorta. Top-normal caliber main pulmonary artery (3.1 cm diameter), stable. Normal heart size. Three-vessel coronary atherosclerosis. No significant pericardial effusion/thickening. Mediastinum/Nodes: No discrete thyroid nodules. Unremarkable esophagus. No axillary adenopathy. Mild right paratracheal adenopathy up to 1.2 cm (series 5/image 33), stable since  03/31/2012. AP window adenopathy up to the 1.5 cm (series 5/image 33), mildly increased from 1.2 cm on 03/31/2012. New mild subcarinal adenopathy up to 1.2 cm (series 5/image 40). New mild bilateral hilar adenopathy up to 1.3 cm on the right (series 5/image 47) and 1.1 cm on the left (series 5/image 45). Lungs/Pleura: No pneumothorax. Trace dependent bilateral pleural effusions. Moderate to severe centrilobular and paraseptal emphysema with diffuse bronchial wall thickening. Extensive new patchy consolidation in peripheral left lower lobe. Less prominent patchy consolidation and ground-glass opacity at the periphery of the bilateral upper lobes and throughout the right lower lobe. No discrete lung masses or significant pulmonary nodules in the limited aerated portions of the lungs. Upper abdomen: No acute abnormality. Musculoskeletal: No aggressive appearing focal osseous lesions. Mild thoracic spondylosis. Review of the MIP images confirms the above findings. IMPRESSION: 1. No pulmonary embolism. 2. Multilobar pneumonia, most prominent in the left lower lobe. 3. Trace dependent bilateral pleural effusions. 4. Moderate to severe emphysema with diffuse bronchial wall thickening, compatible with the provided history of COPD. 5. Three-vessel coronary atherosclerosis. 6. Mediastinal and bilateral hilar adenopathy is new/increased since 2,013 chest CT, nonspecific, most likely reactive. Recommend a follow-up post treatment chest CT with IV contrast in 3 months to reassess these nodes. Aortic Atherosclerosis (ICD10-I70.0) and Emphysema (ICD10-J43.9). Electronically Signed   By: Delbert PhenixJason A Poff M.D.   On: 12/19/2017 12:12   ASSESSMENT AND PLAN:  Hailey Gibbs  is a 72 y.o. female with a known history of chronic COPD, chronic hypoxic respiratory failure and lives on 2 L of oxygen still continues to smoke is presenting to the ED with a chief complaint of worsening shortness of breath for the past 2 days.  The patient was  hypoxemic on 2 L saturating at 88% requiring 4 L of oxygen.  Patient was reporting productive cough and could not sleep last night due to shortness of breath.  Chest x-ray has revealed left lower lobe pneumonia.  #Sepsis secondary to left lower lobe pneumonia - Present on admission - pending Sputum culture and sensitivity - await blood cultures - continue Zosyn. stop vancomycin as MRSA pcr neg Flu test is negative - continue IV fluids - trending down lactic acid level  #Acute on chronic hypoxic respiratory failure secondary to pneumonia Continue oxygen via nasal cannula, wean off as tolerated Bronchodilator therapy - CT chest doesn't show PE, confirms pna Continue antibiotics as above  #Elevated troponin - due to demand ischemia  #Essential hypertension Continue home medication amlodipine and metoprolol   #chronic history of COPD no exacerbation at this time - bronchodilators as needed basis Continue home bronchodilators       All the records are reviewed and case discussed with Care Management/Social Worker. Management plans discussed with the patient, nursing and they are in agreement.  CODE STATUS: Full Code  TOTAL TIME TAKING CARE OF THIS PATIENT: 35 minutes.   More than 50% of the time was spent in counseling/coordination of care: YES  POSSIBLE D/C IN 1-2 DAYS, DEPENDING ON CLINICAL CONDITION.   Delfino LovettVipul Julie-Anne Torain M.D on 12/19/2017 at 4:52 PM  Between 7am to 6pm - Pager - [phone]  After 6pm go to [email or web address] - Social research officer, governmentpassword EPAS [facility]  [facility] [place] [facility]  Office  [phone]  CC: Primary care physician; Tamsen Roershrismon, Dennis E, PA  Note: This dictation was prepared with Dragon dictation along with smaller phrase technology. Any transcriptional errors that result from this process are unintentional.

## 2017-12-20 LAB — BASIC METABOLIC PANEL
Anion gap: 7 (ref 5–15)
BUN: 12 mg/dL (ref 6–20)
CO2: 30 mmol/L (ref 22–32)
Calcium: 8.7 mg/dL — ABNORMAL LOW (ref 8.9–10.3)
Chloride: 99 mmol/L — ABNORMAL LOW (ref 101–111)
Creatinine, Ser: 0.52 mg/dL (ref 0.44–1.00)
GFR calc Af Amer: >60 mL/min (ref >60)
Glucose, Bld: 119 mg/dL — ABNORMAL HIGH (ref 65–99)
POTASSIUM: 3.3 mmol/L — AB (ref 3.5–5.1)
SODIUM: 136 mmol/L (ref 135–145)

## 2017-12-20 LAB — CBC
HCT: 31.7 % — ABNORMAL LOW (ref 35.0–47.0)
Hemoglobin: 10.2 g/dL — ABNORMAL LOW (ref 12.0–16.0)
MCH: 30.1 pg (ref 26.0–34.0)
MCHC: 32.2 g/dL (ref 32.0–36.0)
MCV: 93.4 fL (ref 80.0–100.0)
PLATELETS: 323 10*3/uL (ref 150–440)
RBC: 3.39 MIL/uL — AB (ref 3.80–5.20)
RDW: 13.4 % (ref 11.5–14.5)
WBC: 18.2 10*3/uL — AB (ref 3.6–11.0)

## 2017-12-20 MED ORDER — SALINE SPRAY 0.65 % NA SOLN
1.0000 | NASAL | Status: DC | PRN
  Administered 2017-12-20: 1 via NASAL
  Filled 2017-12-20: qty 44

## 2017-12-20 MED ORDER — POTASSIUM CHLORIDE CRYS ER 20 MEQ PO TBCR
40.0000 meq | EXTENDED_RELEASE_TABLET | Freq: Once | ORAL | Status: AC
  Administered 2017-12-20: 40 meq via ORAL
  Filled 2017-12-20: qty 2

## 2017-12-20 NOTE — Plan of Care (Signed)
  Progressing Education: Knowledge of General Education information will improve 12/20/2017 1411 - Progressing by Burnett KanarisPerez, Carlie Corpus N, RN Health Behavior/Discharge Planning: Ability to manage health-related needs will improve 12/20/2017 1411 - Progressing by Burnett KanarisPerez, Yarden Hillis N, RN Clinical Measurements: Ability to maintain clinical measurements within normal limits will improve 12/20/2017 1411 - Progressing by Burnett KanarisPerez, Demon Volante N, RN Will remain free from infection 12/20/2017 1411 - Progressing by Burnett KanarisPerez, Ehan Freas N, RN Diagnostic test results will improve 12/20/2017 1411 - Progressing by Burnett KanarisPerez, Gwenna Fuston N, RN Respiratory complications will improve 12/20/2017 1411 - Progressing by Burnett KanarisPerez, Jahni Nazar N, RN Cardiovascular complication will be avoided 12/20/2017 1411 - Progressing by Burnett KanarisPerez, Monterio Bob N, RN Activity: Risk for activity intolerance will decrease 12/20/2017 1411 - Progressing by Burnett KanarisPerez, Clarisa Danser N, RN Nutrition: Adequate nutrition will be maintained 12/20/2017 1411 - Progressing by Burnett KanarisPerez, Rainier Feuerborn N, RN Pain Managment: General experience of comfort will improve 12/20/2017 1411 - Progressing by Burnett KanarisPerez, Reisa Coppola N, RN

## 2017-12-20 NOTE — Care Management Note (Signed)
Case Management Note  Patient Details  Name: Hailey Gibbs MRN: [id number] Date of Birth: 09/30/1946  Subjective/Objective:    Admitted to [facility] with the diagnosis of sepsis. Son is Chrissie NoaWilliam [phone]. Last seen Dr. Donaciano Evarisman 04/12/17.  No home Health. No skilled facility. Home oxygen per [facility] since 06/2015. Usually wears 2 liters per nasal cannula continuous.  Takes care of all basic activities of daily living herself.                 Action/Plan: Currently on High Flow Nasal Cannula.  Will continue to follow for discharge needs   Expected Discharge Date:  12/20/17               Expected Discharge Plan:     In-House Referral:   yes  Discharge planning Services     Post Acute Care Choice:    Choice offered to:     DME Arranged:    DME Agency:     HH Arranged:    HH Agency:     Status of Service:     If discussed at [employer] Meetings, dates discussed:    Additional Comments:  Gwenette GreetBrenda S Jazzmyne Rasnick, RN MSN CCM Care Management [phone] 12/20/2017, 9:34 AM

## 2017-12-20 NOTE — Progress Notes (Signed)
Pharmacy Antibiotic Note  Hailey Gibbs is a 72 y.o. female admitted on 12/18/2017 with pneumonia.  Pharmacy has been consulted for piperacillin/tazobactam dosing.  This is day #3 of antibiotics.   Plan: Continue piperacillin/tazobactam 3.375 g IV q8h EI  Height: 5\' 5"  (165.1 cm) Weight: 139 lb 6.4 oz (63.2 kg) IBW/kg (Calculated) : 57  Temp (24hrs), Avg:99.1 F (37.3 C), Min:98.6 F (37 C), Max:99.4 F (37.4 C)  Recent Labs  Lab 12/18/17 1132 12/18/17 1237 12/18/17 1417 12/18/17 1440 12/18/17 1857 12/19/17 0154 12/20/17 0443  WBC 24.7*  --   --   --   --  17.4* 18.2*  CREATININE 0.69  --   --   --   --  0.58 0.52  LATICACIDVEN  --  1.3 2.0* 2.8* 1.9  --   --     Estimated Creatinine Clearance: 58.9 mL/min (by C-G formula based on SCr of 0.52 mg/dL).    Allergies  Allergen Reactions  . Codeine Anxiety         Antimicrobials this admission: Piperacillin/tazobactam 1/9 >>  vancomycin 1/9 >> 1/10  Dose adjustments this admission:  Microbiology results: 1/9 BCx: No growth 2 days 1/9 Sputum: Pending  1/10 MRSA PCR: negative  Thank you for allowing pharmacy to be a part of this patient's care.  Cindi CarbonMary M Alek Poncedeleon, PharmD, BCPS Clinical Pharmacist 12/20/2017 9:50 AM

## 2017-12-20 NOTE — Care Management Important Message (Signed)
Important Message  Patient Details  Name: Hailey Gibbs MRN: [id number] Date of Birth: [birth year]   Medicare Important Message Given:  Yes    Gwenette GreetBrenda S Adrian Dinovo, RN 12/20/2017, 7:13 AM

## 2017-12-20 NOTE — Progress Notes (Addendum)
[facility] - [place] at [facility]   PATIENT NAME: Hailey Gibbs    MR#:  [id number]  DATE OF BIRTH:  [birth year]  SUBJECTIVE:  CHIEF COMPLAINT:   Chief Complaint  Patient presents with  . Shortness of Breath  not feeling better,now requiring HFNC, SOB + REVIEW OF SYSTEMS:  Review of Systems  Constitutional: Negative for chills, fever and weight loss.  HENT: Negative for nosebleeds and sore throat.   Eyes: Negative for blurred vision.  Respiratory: Positive for cough and shortness of breath. Negative for wheezing.   Cardiovascular: Negative for chest pain, orthopnea, leg swelling and PND.  Gastrointestinal: Negative for abdominal pain, constipation, diarrhea, heartburn, nausea and vomiting.  Genitourinary: Negative for dysuria and urgency.  Musculoskeletal: Negative for back pain.  Skin: Negative for rash.  Neurological: Negative for dizziness, speech change, focal weakness and headaches.  Endo/Heme/Allergies: Does not bruise/bleed easily.  Psychiatric/Behavioral: Negative for depression.   DRUG ALLERGIES:   Allergies  Allergen Reactions  . Codeine Anxiety        VITALS:  Blood pressure (!) 111/49, pulse 84, temperature 98.6 F (37 C), temperature source Oral, resp. rate 20, height 5\' 5"  (1.651 m), weight 63.2 kg (139 lb 6.4 oz), SpO2 92 %. PHYSICAL EXAMINATION:  Physical Exam  Constitutional: She is oriented to person, place, and time and well-developed, well-nourished, and in no distress.  HENT:  Head: Normocephalic and atraumatic.  Eyes: Conjunctivae and EOM are normal. Pupils are equal, round, and reactive to light.  Neck: Normal range of motion. Neck supple. No tracheal deviation present. No thyromegaly present.  Cardiovascular: Normal rate, regular rhythm and normal heart sounds.  Pulmonary/Chest: Breath sounds normal. She is in respiratory distress. She has no wheezes. She exhibits no tenderness.  Abdominal: Soft. Bowel sounds are normal.  She exhibits no distension. There is no tenderness.  Musculoskeletal: Normal range of motion.  Neurological: She is alert and oriented to person, place, and time. No cranial nerve deficit.  Skin: Skin is warm and dry. No rash noted.  Psychiatric: Mood and affect normal.   LABORATORY PANEL:  Female CBC Recent Labs  Lab 12/20/17 0443  WBC 18.2*  HGB 10.2*  HCT 31.7*  PLT 323   ------------------------------------------------------------------------------------------------------------------ Chemistries  Recent Labs  Lab 12/19/17 0154 12/20/17 0443  NA 139 136  K 4.1 3.3*  CL 100* 99*  CO2 32 30  GLUCOSE 173* 119*  BUN 12 12  CREATININE 0.58 0.52  CALCIUM 9.2 8.7*  AST 23  --   ALT 12*  --   ALKPHOS 95  --   BILITOT 0.5  --    RADIOLOGY:  No results found. ASSESSMENT AND PLAN:  Hailey Gibbs  is a 72 y.o. female with a known history of chronic COPD, chronic hypoxic respiratory failure and lives on 2 L of oxygen still continues to smoke is presenting to the ED with a chief complaint of worsening shortness of breath for the past 2 days.  The patient was hypoxemic on 2 L saturating at 88% requiring 4 L of oxygen.  Patient was reporting productive cough and could not sleep last night due to shortness of breath.  Chest x-ray has revealed left lower lobe pneumonia.  #Sepsis secondary to left lower lobe pneumonia - Present on admission - pending Sputum culture and sensitivity - await blood cultures - continue Zosyn. stop vancomycin as MRSA pcr neg Flu test is negative - continue IV fluids - trending down lactic acid level  #Acute on  chronic hypoxic respiratory failure secondary to pneumonia Continue oxygen via nasal cannula, wean off as tolerated Bronchodilator therapy - CT chest doesn't show PE, confirms pna and COPD Continue antibiotics as above  #Elevated troponin - due to demand ischemia  #Essential hypertension Continue home medication amlodipine and  metoprolol   #chronic history of COPD no exacerbation at this time - bronchodilators as needed basis Continue home bronchodilators       All the records are reviewed and case discussed with Care Management/Social Worker. Management plans discussed with the patient, nursing and they are in agreement.  CODE STATUS: Full Code  TOTAL TIME TAKING CARE OF THIS PATIENT: 35 minutes.   More than 50% of the time was spent in counseling/coordination of care: YES  POSSIBLE D/C IN 2-3 DAYS, DEPENDING ON CLINICAL CONDITION.   Delfino Lovett M.D on 12/20/2017 at 6:45 PM  Between 7am to 6pm - Pager - [phone]  After 6pm go to [email or web address] - Social research officer, government  [facility] [place] [facility]  Office  [phone]  CC: Primary care physician; Tamsen Roers, PA  Note: This dictation was prepared with Dragon dictation along with smaller phrase technology. Any transcriptional errors that result from this process are unintentional.

## 2017-12-20 NOTE — Progress Notes (Signed)
Family Meeting Note  Advance Directive:no  Today a meeting took place with the Patient.  The following clinical team members were present during this meeting:MD  The following were discussed:Patient's diagnosis:   72 y.o.femalewith a known history of chronic COPD, chronic hypoxic respiratory failure and lives on 2 L of oxygen still continues to smoke is admitted for worsening shortness of breath and was noted to have sepsis secondary to left lower lobe pneumonia. She is requiring HFNC today.  Patient's progosis: > 12 months and Goals for treatment: Full Code  Additional follow-up to be provided: Ongoing discussion for goals of care and consideration for DNR, Can consider Palliative care c/s if she is still here on Monday and not better  Time spent during discussion:20 minutes  Delfino LovettVipul Vicci Reder, MD

## 2017-12-21 LAB — CBC
HCT: 30.8 % — ABNORMAL LOW (ref 35.0–47.0)
HEMOGLOBIN: 9.9 g/dL — AB (ref 12.0–16.0)
MCH: 30.2 pg (ref 26.0–34.0)
MCHC: 32.2 g/dL (ref 32.0–36.0)
MCV: 93.9 fL (ref 80.0–100.0)
PLATELETS: 323 10*3/uL (ref 150–440)
RBC: 3.28 MIL/uL — ABNORMAL LOW (ref 3.80–5.20)
RDW: 12.8 % (ref 11.5–14.5)
WBC: 11.9 10*3/uL — ABNORMAL HIGH (ref 3.6–11.0)

## 2017-12-21 LAB — BASIC METABOLIC PANEL
Anion gap: 7 (ref 5–15)
BUN: 12 mg/dL (ref 6–20)
CO2: 30 mmol/L (ref 22–32)
CREATININE: 0.57 mg/dL (ref 0.44–1.00)
Calcium: 8.8 mg/dL — ABNORMAL LOW (ref 8.9–10.3)
Chloride: 99 mmol/L — ABNORMAL LOW (ref 101–111)
Glucose, Bld: 112 mg/dL — ABNORMAL HIGH (ref 65–99)
Potassium: 3.4 mmol/L — ABNORMAL LOW (ref 3.5–5.1)
SODIUM: 136 mmol/L (ref 135–145)

## 2017-12-21 MED ORDER — POTASSIUM CHLORIDE CRYS ER 20 MEQ PO TBCR
40.0000 meq | EXTENDED_RELEASE_TABLET | Freq: Once | ORAL | Status: AC
  Administered 2017-12-21: 15:00:00 40 meq via ORAL
  Filled 2017-12-21: qty 2

## 2017-12-21 NOTE — Progress Notes (Signed)
ELECTROLYTE CONSULT   Pharmacy Consult for Electrolytes  Indication: hypokalemia  Allergies  Allergen Reactions  . Codeine Anxiety        Labs: Recent Labs    12/19/17 0154 12/20/17 0443 12/21/17 0430  WBC 17.4* 18.2* 11.9*  HGB 11.0* 10.2* 9.9*  HCT 33.9* 31.7* 30.8*  PLT 311 323 323  CREATININE 0.58 0.52 0.57  ALBUMIN 3.3*  --   --   PROT 6.8  --   --   AST 23  --   --   ALT 12*  --   --   ALKPHOS 95  --   --   BILITOT 0.5  --   --    Estimated Creatinine Clearance: 58.9 mL/min (by C-G formula based on SCr of 0.57 mg/dL).  Sodium (mmol/L)  Date Value  12/21/2017 136  04/12/2017 140  02/26/2014 135 (L)   Potassium (mmol/L)  Date Value  12/21/2017 3.4 (L)  02/26/2014 3.7   Calcium (mg/dL)  Date Value  13/08/657801/11/2018 8.8 (L)   Calcium, Total (mg/dL)  Date Value  46/96/295203/20/2015 9.2   Albumin (g/dL)  Date Value  84/13/244001/09/2018 3.3 (L)  04/12/2017 4.6  02/25/2014 2.9 (L)    Assessment: 72 yo female with history of COPD admitted with worsening SOB and noted to have sepsis secondary to left lower lobe pneumonia. Pharmacy was consulted to manage and replace electrolytes.   Goal of Therapy:  K = 3.5 - 5.1  Plan:  K = 3.4. Replaced K with potassium chloride 40mEq PO once today and follow up with labs in the AM.   Yolanda BonineHannah Emmory Solivan, PharmD Pharmacy Resident 12/21/2017,3:22 PM

## 2017-12-21 NOTE — Progress Notes (Signed)
[facility] [place] [facility]   PATIENT NAME: Hailey Gibbs    MR#:  [id number]  DATE OF BIRTH:  1945/12/12  SUBJECTIVE:  CHIEF COMPLAINT:   Chief Complaint  Patient presents with  . Shortness of Breath  Feeling okay on 6 L of oxygen via nasal cannula.  Shortness of breath slightly better today REVIEW OF SYSTEMS:  Review of Systems  Constitutional: Negative for chills, fever and weight loss.  HENT: Negative for nosebleeds and sore throat.   Eyes: Negative for blurred vision.  Respiratory: Positive for cough and shortness of breath. Negative for wheezing.   Cardiovascular: Negative for chest pain, orthopnea, leg swelling and PND.  Gastrointestinal: Negative for abdominal pain, constipation, diarrhea, heartburn, nausea and vomiting.  Genitourinary: Negative for dysuria and urgency.  Musculoskeletal: Negative for back pain.  Skin: Negative for rash.  Neurological: Negative for dizziness, speech change, focal weakness and headaches.  Endo/Heme/Allergies: Does not bruise/bleed easily.  Psychiatric/Behavioral: Negative for depression.   DRUG ALLERGIES:   Allergies  Allergen Reactions  . Codeine Anxiety        VITALS:  Blood pressure (!) 94/52, pulse 90, temperature 98.5 F (36.9 C), temperature source Oral, resp. rate 16, height 5\' 5"  (1.651 m), weight 63.2 kg (139 lb 6.4 oz), SpO2 93 %. PHYSICAL EXAMINATION:  Physical Exam  Constitutional: She is oriented to person, place, and time and well-developed, well-nourished, and in no distress.  HENT:  Head: Normocephalic and atraumatic.  Eyes: Conjunctivae and EOM are normal. Pupils are equal, round, and reactive to light.  Neck: Normal range of motion. Neck supple. No tracheal deviation present. No thyromegaly present.  Cardiovascular: Normal rate, regular rhythm and normal heart sounds.  Pulmonary/Chest: Breath sounds normal. She is in respiratory distress. She has no wheezes. She exhibits no  tenderness.  Abdominal: Soft. Bowel sounds are normal. She exhibits no distension. There is no tenderness.  Musculoskeletal: Normal range of motion.  Neurological: She is alert and oriented to person, place, and time. No cranial nerve deficit.  Skin: Skin is warm and dry. No rash noted.  Psychiatric: Mood and affect normal.   LABORATORY PANEL:  Female CBC Recent Labs  Lab 12/21/17 0430  WBC 11.9*  HGB 9.9*  HCT 30.8*  PLT 323   ------------------------------------------------------------------------------------------------------------------ Chemistries  Recent Labs  Lab 12/19/17 0154  12/21/17 0430  NA 139   < > 136  K 4.1   < > 3.4*  CL 100*   < > 99*  CO2 32   < > 30  GLUCOSE 173*   < > 112*  BUN 12   < > 12  CREATININE 0.58   < > 0.57  CALCIUM 9.2   < > 8.8*  AST 23  --   --   ALT 12*  --   --   ALKPHOS 95  --   --   BILITOT 0.5  --   --    < > = values in this interval not displayed.   RADIOLOGY:  No results found. ASSESSMENT AND PLAN:  Hailey Gibbs  is a 72 y.o. female with a known history of chronic COPD, chronic hypoxic respiratory failure and lives on 2 L of oxygen still continues to smoke is presenting to the ED with a chief complaint of worsening shortness of breath for the past 2 days.  The patient was hypoxemic on 2 L saturating at 88% requiring 4 L of oxygen.  Patient was reporting productive cough and could  not sleep last night due to shortness of breath.  Chest x-ray has revealed left lower lobe pneumonia.  #Sepsis secondary to left lower lobe pneumonia - Present on admission - pending Sputum culture and sensitivity - neg blood cultures - continue Zosyn. stop vancomycin as MRSA pcr neg Flu test is negative - continue IV fluids - trending down lactic acid level  #Acute on chronic hypoxic respiratory failure secondary to pneumonia Continue oxygen via nasal cannula, wean off as tolerated Bronchodilator therapy - CT chest doesn't show PE, confirms  pna and COPD Continue antibiotics as above  #Elevated troponin - due to demand ischemia  #Essential hypertension Continue home medication amlodipine and metoprolol   #chronic history of COPD no exacerbation at this time - bronchodilators as needed basis Continue home bronchodilators       All the records are reviewed and case discussed with Care Management/Social Worker. Management plans discussed with the patient, nursing and they are in agreement.  CODE STATUS: Full Code  TOTAL TIME TAKING CARE OF THIS PATIENT: 35 minutes.   More than 50% of the time was spent in counseling/coordination of care: YES  POSSIBLE D/C IN 2-3 DAYS, DEPENDING ON CLINICAL CONDITION.   Ramonita Lab M.D on 12/21/2017 at 4:27 PM  Between 7am to 6pm - Pager - [phone]  After 6pm go to [email or web address] - Social research officer, government  [facility] [place] [facility]  Office  [phone]  CC: Primary care physician; Tamsen Roers, PA  Note: This dictation was prepared with Dragon dictation along with smaller phrase technology. Any transcriptional errors that result from this process are unintentional.

## 2017-12-22 LAB — CULTURE, RESPIRATORY: SPECIAL REQUESTS: NORMAL

## 2017-12-22 LAB — BASIC METABOLIC PANEL
Anion gap: 7 (ref 5–15)
BUN: 9 mg/dL (ref 6–20)
CHLORIDE: 99 mmol/L — AB (ref 101–111)
CO2: 33 mmol/L — AB (ref 22–32)
Calcium: 9.2 mg/dL (ref 8.9–10.3)
Creatinine, Ser: 0.53 mg/dL (ref 0.44–1.00)
GFR calc Af Amer: >60 mL/min (ref >60)
GFR calc non Af Amer: >60 mL/min (ref >60)
GLUCOSE: 103 mg/dL — AB (ref 65–99)
POTASSIUM: 4.3 mmol/L (ref 3.5–5.1)
Sodium: 139 mmol/L (ref 135–145)

## 2017-12-22 LAB — MAGNESIUM: Magnesium: 1.8 mg/dL (ref 1.7–2.4)

## 2017-12-22 LAB — CULTURE, RESPIRATORY W GRAM STAIN

## 2017-12-22 MED ORDER — MENTHOL 3 MG MT LOZG
1.0000 | LOZENGE | OROMUCOSAL | Status: DC | PRN
  Filled 2017-12-22: qty 9

## 2017-12-22 MED ORDER — CEPASTAT 14.5 MG MT LOZG
1.0000 | LOZENGE | OROMUCOSAL | Status: DC | PRN
  Filled 2017-12-22: qty 9

## 2017-12-22 MED ORDER — AMOXICILLIN-POT CLAVULANATE 875-125 MG PO TABS
1.0000 | ORAL_TABLET | Freq: Two times a day (BID) | ORAL | Status: DC
  Administered 2017-12-23: 08:00:00 1 via ORAL
  Filled 2017-12-22: qty 1

## 2017-12-22 NOTE — Progress Notes (Signed)
ELECTROLYTE CONSULT   Pharmacy Consult for Electrolytes  Indication: hypokalemia  Allergies  Allergen Reactions  . Codeine Anxiety        Labs: Recent Labs    12/20/17 0443 12/21/17 0430 12/22/17 0509  WBC 18.2* 11.9*  --   HGB 10.2* 9.9*  --   HCT 31.7* 30.8*  --   PLT 323 323  --   CREATININE 0.52 0.57 0.53  MG  --   --  1.8   Estimated Creatinine Clearance: 58.9 mL/min (by C-G formula based on SCr of 0.53 mg/dL).  Sodium (mmol/L)  Date Value  12/22/2017 139  04/12/2017 140  02/26/2014 135 (L)   Potassium (mmol/L)  Date Value  12/22/2017 4.3  02/26/2014 3.7   Calcium (mg/dL)  Date Value  82/95/621301/13/2019 9.2   Calcium, Total (mg/dL)  Date Value  08/65/784603/20/2015 9.2   Albumin (g/dL)  Date Value  96/29/528401/09/2018 3.3 (L)  04/12/2017 4.6  02/25/2014 2.9 (L)    Assessment: 72 yo female with history of COPD admitted with worsening SOB and noted to have sepsis secondary to left lower lobe pneumonia. Pharmacy was consulted to manage and replace electrolytes.   Goal of Therapy:  K = 3.5 - 5.1  Plan:  K = 3.4. Replaced K with potassium chloride 40mEq PO once today and follow up with labs in the AM.   1/13: K 4.3, Mag 1.8. No supplementation needed at this time. Will f/u am labs.   Bari MantisKristin Alexcis Bicking PharmD Clinical Pharmacist 12/22/2017

## 2017-12-22 NOTE — Progress Notes (Addendum)
[facility] - [place] [facility]   PATIENT NAME: Hailey SaaJuanita Gibbs    MR#:  [id number]  DATE OF BIRTH:  February 15, 1946  SUBJECTIVE:  CHIEF COMPLAINT:   Chief Complaint  Patient presents with  . Shortness of Breath  Feeling okay on  4 L of oxygen via nasal cannula.  Shortness of breath better today REVIEW OF SYSTEMS:  Review of Systems  Constitutional: Negative for chills, fever and weight loss.  HENT: Negative for nosebleeds and sore throat.   Eyes: Negative for blurred vision.  Respiratory: Positive for cough and shortness of breath. Negative for wheezing.   Cardiovascular: Negative for chest pain, orthopnea, leg swelling and PND.  Gastrointestinal: Negative for abdominal pain, constipation, diarrhea, heartburn, nausea and vomiting.  Genitourinary: Negative for dysuria and urgency.  Musculoskeletal: Negative for back pain.  Skin: Negative for rash.  Neurological: Negative for dizziness, speech change, focal weakness and headaches.  Endo/Heme/Allergies: Does not bruise/bleed easily.  Psychiatric/Behavioral: Negative for depression.   DRUG ALLERGIES:   Allergies  Allergen Reactions  . Codeine Anxiety        VITALS:  Blood pressure (!) 110/57, pulse 84, temperature 98.8 F (37.1 C), temperature source Oral, resp. rate (!) 22, height 5\' 5"  (1.651 m), weight 63.2 kg (139 lb 6.4 oz), SpO2 97 %. PHYSICAL EXAMINATION:  Physical Exam  Constitutional: She is oriented to person, place, and time and well-developed, well-nourished, and in no distress.  HENT:  Head: Normocephalic and atraumatic.  Eyes: Conjunctivae and EOM are normal. Pupils are equal, round, and reactive to light.  Neck: Normal range of motion. Neck supple. No tracheal deviation present. No thyromegaly present.  Cardiovascular: Normal rate, regular rhythm and normal heart sounds.  Pulmonary/Chest: Breath sounds normal. She is in respiratory distress. She has no wheezes. She exhibits no tenderness.    Abdominal: Soft. Bowel sounds are normal. She exhibits no distension. There is no tenderness.  Musculoskeletal: Normal range of motion.  Neurological: She is alert and oriented to person, place, and time. No cranial nerve deficit.  Skin: Skin is warm and dry. No rash noted.  Psychiatric: Mood and affect normal.   LABORATORY PANEL:  Female CBC Recent Labs  Lab 12/21/17 0430  WBC 11.9*  HGB 9.9*  HCT 30.8*  PLT 323   ------------------------------------------------------------------------------------------------------------------ Chemistries  Recent Labs  Lab 12/19/17 0154  12/22/17 0509  NA 139   < > 139  K 4.1   < > 4.3  CL 100*   < > 99*  CO2 32   < > 33*  GLUCOSE 173*   < > 103*  BUN 12   < > 9  CREATININE 0.58   < > 0.53  CALCIUM 9.2   < > 9.2  MG  --   --  1.8  AST 23  --   --   ALT 12*  --   --   ALKPHOS 95  --   --   BILITOT 0.5  --   --    < > = values in this interval not displayed.   RADIOLOGY:  No results found. ASSESSMENT AND PLAN:  Hailey Gibbs  is a 72 y.o. female with a known history of chronic COPD, chronic hypoxic respiratory failure and lives on 2 L of oxygen still continues to smoke is presenting to the ED with a chief complaint of worsening shortness of breath for the past 2 days.  The patient was hypoxemic on 2 L saturating at 88% requiring 4  L of oxygen.  Patient was reporting productive cough and could not sleep last night due to shortness of breath.  Chest x-ray has revealed left lower lobe pneumonia.  #Sepsis secondary to left lower lobe pneumonia - Present on admission - pending Sputum culture and sensitivity - neg blood cultures - continue Zosyn. stop vancomycin as MRSA pcr neg, will change to p.o. Augmentin tomorrow and anticipate discharge in a.m.  flu test is negative - continued IV fluids, tolerating diet will discontinue IV fluids - trending down lactic acid level  #Acute on chronic hypoxic respiratory failure secondary to  pneumonia Continue oxygen via nasal cannula, wean off as tolerated Bronchodilator therapy - CT chest doesn't show PE, confirms pna and COPD Continue antibiotics as above  #Elevated troponin - due to demand ischemia  #Essential hypertension Continue home medication amlodipine and metoprolol   #chronic history of COPD no exacerbation at this time - bronchodilators as needed basis Continue home bronchodilators    Generalized weakness PT assessment   All the records are reviewed and case discussed with Care Management/Social Worker. Management plans discussed with the patient, nursing and they are in agreement.  CODE STATUS: Full Code  TOTAL TIME TAKING CARE OF THIS PATIENT: 35 minutes.   More than 50% of the time was spent in counseling/coordination of care: YES  POSSIBLE D/C IN 1-2  DAYS, DEPENDING ON CLINICAL CONDITION.   Ramonita Lab M.D on 12/22/2017 at 1:48 PM  Between 7am to 6pm - Pager - [phone]  After 6pm go to [email or web address] - Social research officer, government  [facility] [place] [facility]  Office  [phone]  CC: Primary care physician; Tamsen Roers, PA  Note: This dictation was prepared with Dragon dictation along with smaller phrase technology. Any transcriptional errors that result from this process are unintentional.

## 2017-12-23 ENCOUNTER — Other Ambulatory Visit: Payer: Self-pay | Admitting: Family Medicine

## 2017-12-23 LAB — CULTURE, BLOOD (ROUTINE X 2)
CULTURE: NO GROWTH
Culture: NO GROWTH
SPECIAL REQUESTS: ADEQUATE
SPECIAL REQUESTS: ADEQUATE

## 2017-12-23 LAB — BASIC METABOLIC PANEL
ANION GAP: 6 (ref 5–15)
BUN: 6 mg/dL (ref 6–20)
CALCIUM: 9 mg/dL (ref 8.9–10.3)
CO2: 34 mmol/L — ABNORMAL HIGH (ref 22–32)
Chloride: 99 mmol/L — ABNORMAL LOW (ref 101–111)
Creatinine, Ser: 0.46 mg/dL (ref 0.44–1.00)
GLUCOSE: 104 mg/dL — AB (ref 65–99)
Potassium: 3.5 mmol/L (ref 3.5–5.1)
SODIUM: 139 mmol/L (ref 135–145)

## 2017-12-23 LAB — MAGNESIUM: Magnesium: 1.8 mg/dL (ref 1.7–2.4)

## 2017-12-23 MED ORDER — NICOTINE 7 MG/24HR TD PT24: 7.0000 mg | MEDICATED_PATCH | Freq: Every day | TRANSDERMAL | 0 refills | Status: DC

## 2017-12-23 MED ORDER — CIPROFLOXACIN HCL 500 MG PO TABS: 500.0000 mg | ORAL_TABLET | Freq: Two times a day (BID) | ORAL | Status: DC

## 2017-12-23 MED ORDER — POTASSIUM CHLORIDE CRYS ER 20 MEQ PO TBCR
20.0000 meq | EXTENDED_RELEASE_TABLET | Freq: Two times a day (BID) | ORAL | Status: DC
  Administered 2017-12-23: 20 meq via ORAL
  Filled 2017-12-23: qty 1

## 2017-12-23 MED ORDER — CIPROFLOXACIN HCL 500 MG PO TABS: 500.0000 mg | ORAL_TABLET | Freq: Two times a day (BID) | ORAL | 0 refills | Status: DC

## 2017-12-23 NOTE — Progress Notes (Signed)
Pt is being discharged today, discharge instructions were given to her, she verified understanding. 1 paper prescription was given to her,all belongings were packed and returned to the patient. She was rolled out in a wheelchair by staff.

## 2017-12-23 NOTE — Discharge Instructions (Signed)
Follow up with primary care physician in 1 week

## 2017-12-23 NOTE — Progress Notes (Addendum)
ELECTROLYTE CONSULT   Pharmacy Consult for Electrolytes  Indication: hypokalemia  Allergies  Allergen Reactions  . Codeine Anxiety        Labs: Recent Labs    12/21/17 0430 12/22/17 0509 12/23/17 0336  WBC 11.9*  --   --   HGB 9.9*  --   --   HCT 30.8*  --   --   PLT 323  --   --   CREATININE 0.57 0.53 0.46  MG  --  1.8  --    Estimated Creatinine Clearance: 58.9 mL/min (by C-G formula based on SCr of 0.46 mg/dL).  Sodium (mmol/L)  Date Value  12/23/2017 139  04/12/2017 140  02/26/2014 135 (L)   Potassium (mmol/L)  Date Value  12/23/2017 3.5  02/26/2014 3.7   Calcium (mg/dL)  Date Value  91/47/829501/14/2019 9.0   Calcium, Total (mg/dL)  Date Value  62/13/086503/20/2015 9.2   Albumin (g/dL)  Date Value  78/46/962901/09/2018 3.3 (L)  04/12/2017 4.6  02/25/2014 2.9 (L)    Assessment: 72 yo female with history of COPD admitted with worsening SOB and noted to have sepsis secondary to left lower lobe pneumonia. Pharmacy was consulted to manage and replace electrolytes.   Goal of Therapy:  K = 3.5 - 5.1  Plan:  K = 3.4. Replaced K with potassium chloride 40mEq PO once today and follow up with labs in the AM.   1/13: K 4.3, Mag 1.8. No supplementation needed at this time. Will f/u am labs.   01/14 K 3.5, Mg 1.8. Will give Klor-Con 20 mEq po BID with meals x 2 doses and recheck all electrolytes with AM labs.  Nydia Ytuarte A. Giffordookson, Vermont[place]arm.D., BCPS Clinical Pharmacist 12/23/2017

## 2017-12-23 NOTE — Care Management (Signed)
Physical therapy evaluation completed. Recommending home with home health and physical therapy. Hailey Gibbs is declining home health and therapy at this time. Discharge to home today per Dr. Ruta HindsGouru Kaleigh Spiegelman S Patsey Pitstick RN MSN CCM Care Management [phone]

## 2017-12-23 NOTE — Discharge Summary (Signed)
[facility] [place] at [facility]   PATIENT NAME: Hailey Gibbs    MR#:  [id number]  DATE OF BIRTH:  21-Sep-1946  DATE OF ADMISSION:  12/18/2017 ADMITTING PHYSICIAN: Ramonita Lab, MD  DATE OF DISCHARGE:  12/23/17  PRIMARY CARE PHYSICIAN: Chrismon, Jodell Cipro, PA    ADMISSION DIAGNOSIS:  Hypoxia [R09.02] COPD exacerbation (HCC) [J44.1] Elevated troponin I level [R74.8] Community acquired pneumonia of left lower lobe of lung (HCC) [J18.1]  DISCHARGE DIAGNOSIS:  Active Problems:   Sepsis (HCC)   SECONDARY DIAGNOSIS:   Past Medical History:  Diagnosis Date  . Anxiety   . Carotid artery occlusion   . COPD (chronic obstructive pulmonary disease) (HCC)   . Depression   . GERD (gastroesophageal reflux disease)   . Hyperlipidemia   . Hypertension   . Seasonal allergies   . Shortness of breath dyspnea     HOSPITAL COURSE:   HPI  Hailey Gibbs  is a 72 y.o. female with a known history of chronic COPD, chronic hypoxic respiratory failure and lives on 2 L of oxygen still continues to smoke is presenting to the ED with a chief complaint of worsening shortness of breath for the past 2 days.  The patient was hypoxemic on 2 L saturating at 88% requiring 4 L of oxygen.  Patient was reporting productive cough and could not sleep last night due to shortness of breath.  Chest x-ray has revealed left lower lobe pneumonia.  Patient is started on antibiotics and hospitalist team was called to admit the patient.  Patient was tachycardic and white blood cell count is elevated.  Troponin is elevated but patient denies any chest pain while resting, but reports chest pain while coughing  #Sepsis secondary to left lower lobe pneumonia - Present on admission -  Sputum culture with Pseudomonas aeruginosa and Pasteurella multocida, discharge patient with p.o. ciprofloxacin for another 6 days  (  total 12 days - neg blood cultures -Treated with Zosyn and stopped vancomycin  as  MRSA pcr neg, will discharge patient with p.o. ciprofloxacin flu test is negative - continued IV fluids, tolerating diet will discontinue IV fluids - trending down lactic acid level -Chronically on 2 L of oxygen currently requiring 3 L of oxygen via nasal cannula  #Acute on chronic hypoxic respiratory failure secondary to pneumonia Continue oxygen via nasal cannula,wean off as tolerated Bronchodilator therapy - CT chest doesn't show PE, confirms pna and COPD Continue antibiotics as above  #Elevated troponin - due to demand ischemia  #Essential hypertension Continue home medication amlodipine and metoprolol  #chronic history of COPD no exacerbation at this time - bronchodilators as needed basis Continue home bronchodilators    Generalized weakness PT assessment-recommended home health PT patient refused    DISCHARGE CONDITIONS:   Stable   CONSULTS OBTAINED:     PROCEDURES  None   DRUG ALLERGIES:   Allergies  Allergen Reactions  . Codeine Anxiety         DISCHARGE MEDICATIONS:   Allergies as of 12/23/2017      Reactions   Codeine Anxiety          Medication List    TAKE these medications   acetaminophen 500 MG tablet Commonly known as:  TYLENOL Take 1,000 mg by mouth every 6 (six) hours as needed for mild pain or headache.   albuterol 108 (90 Base) MCG/ACT inhaler Commonly known as:  VENTOLIN HFA INHALE 1-2 PUFFS 4 TIMES A DAY AS NEEDED FOR WHEEZING  amLODipine 10 MG tablet Commonly known as:  NORVASC TAKE 1 TABLET (10 MG TOTAL) BY MOUTH DAILY.   aspirin 81 MG chewable tablet Chew 81 mg by mouth at bedtime.   atorvastatin 10 MG tablet Commonly known as:  LIPITOR Take 1 tablet (10 mg total) by mouth daily.   cetirizine 10 MG tablet Commonly known as:  ZYRTEC Take 10 mg by mouth at bedtime.   ciprofloxacin 500 MG tablet Commonly known as:  CIPRO Take 1 tablet (500 mg total) by mouth 2 (two) times daily.   metoprolol tartrate 25 MG  tablet Commonly known as:  LOPRESSOR Take 1 tablet (25 mg total) by mouth 2 (two) times daily. What changed:  when to take this   multivitamin tablet Take 1 tablet by mouth at bedtime.   nicotine 7 mg/24hr patch Commonly known as:  NICODERM CQ - dosed in mg/24 hr Place 1 patch (7 mg total) onto the skin daily. Start taking on:  12/24/2017   sertraline 100 MG tablet Commonly known as:  ZOLOFT 1 (ONE) TABLET, ORAL, AT BEDTIME DAILY BY MOUTH   SPIRIVA HANDIHALER 18 MCG inhalation capsule Generic drug:  tiotropium INHALE 1 CAPSULE ONCE A DAY   SYMBICORT 160-4.5 MCG/ACT inhaler Generic drug:  budesonide-formoterol INHALE 2 PUFFS INTO THE LUNGS 2 (TWO) TIMES DAILY.   Vitamin D3 2000 units Tabs Take by mouth daily.   vitamin E 400 UNIT capsule Take 400 Units by mouth daily.        DISCHARGE INSTRUCTIONS:   Follow-up with primary care physician in 1 week  DIET:  Cardiac diet  DISCHARGE CONDITION:  Fair  ACTIVITY:  Activity as tolerated  OXYGEN:  Home Oxygen: Yes.     Oxygen Delivery: 3 liters/min via Patient connected to nasal cannula oxygen  DISCHARGE LOCATION:  home   If you experience worsening of your admission symptoms, develop shortness of breath, life threatening emergency, suicidal or homicidal thoughts you must seek medical attention immediately by calling 911 or calling your MD immediately  if symptoms less severe.  You Must read complete instructions/literature along with all the possible adverse reactions/side effects for all the Medicines you take and that have been prescribed to you. Take any new Medicines after you have completely understood and accpet all the possible adverse reactions/side effects.   Please note  You were cared for by a hospitalist during your hospital stay. If you have any questions about your discharge medications or the care you received while you were in the hospital after you are discharged, you can call the unit and asked to  speak with the hospitalist on call if the hospitalist that took care of you is not available. Once you are discharged, your primary care physician will handle any further medical issues. Please note that NO REFILLS for any discharge medications will be authorized once you are discharged, as it is imperative that you return to your primary care physician (or establish a relationship with a primary care physician if you do not have one) for your aftercare needs so that they can reassess your need for medications and monitor your lab values.     Today  Chief Complaint  Patient presents with  . Shortness of Breath   Patient is feeling much better.  Denies any shortness of breath.  Wants to go home.  Lives on 2 L of oxygen at home and currently on 3 L of oxygen will continue the same.  Refused home physical therapy  ROS:  CONSTITUTIONAL:  Denies fevers, chills. Denies any fatigue, weakness.  EYES: Denies blurry vision, double vision, eye pain. EARS, NOSE, THROAT: Denies tinnitus, ear pain, hearing loss. RESPIRATORY: Denies cough, wheeze, shortness of breath.  CARDIOVASCULAR: Denies chest pain, palpitations, edema.  GASTROINTESTINAL: Denies nausea, vomiting, diarrhea, abdominal pain. Denies bright red blood per rectum. GENITOURINARY: Denies dysuria, hematuria. ENDOCRINE: Denies nocturia or thyroid problems. HEMATOLOGIC AND LYMPHATIC: Denies easy bruising or bleeding. SKIN: Denies rash or lesion. MUSCULOSKELETAL: Denies pain in neck, back, shoulder, knees, hips or arthritic symptoms.  NEUROLOGIC: Denies paralysis, paresthesias.  PSYCHIATRIC: Denies anxiety or depressive symptoms.   VITAL SIGNS:  Blood pressure 106/64, pulse 88, temperature 98.1 F (36.7 C), temperature source Oral, resp. rate 16, height 5\' 5"  (1.651 m), weight 63.2 kg (139 lb 6.4 oz), SpO2 (!) 89 %.  I/O:    Intake/Output Summary (Last 24 hours) at 12/23/2017 1333 Last data filed at 12/23/2017 0900 Gross per 24 hour   Intake 120 ml  Output -  Net 120 ml    PHYSICAL EXAMINATION:  GENERAL:  72 y.o.-year-old patient lying in the bed with no acute distress.  EYES: Pupils equal, round, reactive to light and accommodation. No scleral icterus. Extraocular muscles intact.  HEENT: Head atraumatic, normocephalic. Oropharynx and nasopharynx clear.  NECK:  Supple, no jugular venous distention. No thyroid enlargement, no tenderness.  LUNGS: Normal breath sounds bilaterally, no wheezing, rales,rhonchi or crepitation. No use of accessory muscles of respiration.  CARDIOVASCULAR: S1, S2 normal. No murmurs, rubs, or gallops.  ABDOMEN: Soft, non-tender, non-distended. Bowel sounds present. No organomegaly or mass.  EXTREMITIES: No pedal edema, cyanosis, or clubbing.  NEUROLOGIC: Cranial nerves II through XII are intact. Muscle strength 5/5 in all extremities. Sensation intact. Gait not checked.  PSYCHIATRIC: The patient is alert and oriented x 3.  SKIN: No obvious rash, lesion, or ulcer.   DATA REVIEW:   CBC Recent Labs  Lab 12/21/17 0430  WBC 11.9*  HGB 9.9*  HCT 30.8*  PLT 323    Chemistries  Recent Labs  Lab 12/19/17 0154  12/23/17 0336  NA 139   < > 139  K 4.1   < > 3.5  CL 100*   < > 99*  CO2 32   < > 34*  GLUCOSE 173*   < > 104*  BUN 12   < > 6  CREATININE 0.58   < > 0.46  CALCIUM 9.2   < > 9.0  MG  --    < > 1.8  AST 23  --   --   ALT 12*  --   --   ALKPHOS 95  --   --   BILITOT 0.5  --   --    < > = values in this interval not displayed.    Cardiac Enzymes Recent Labs  Lab 12/19/17 0154  TROPONINI <0.03    Microbiology Results  Results for orders placed or performed during the hospital encounter of 12/18/17  Culture, expectorated sputum-assessment     Status: None   Collection Time: 12/18/17 12:47 PM  Result Value Ref Range Status   Specimen Description SPUTUM  Final   Special Requests Normal  Final   Sputum evaluation   Final    THIS SPECIMEN IS ACCEPTABLE FOR SPUTUM  CULTURE Performed at [facility], [address], [place], [place] [address]    Report Status 12/18/2017 FINAL  Final  Culture, respiratory (NON-Expectorated)     Status: None   Collection Time: 12/18/17 12:47 PM  Result Value Ref Range Status   Specimen Description   Final    SPUTUM Performed at [facility], [address] [place], [place] [address]    Special Requests   Final    Normal Reflexed from [id number] Performed at [facility], [address] [place], [place] [address]    Gram Stain   Final    ABUNDANT WBC PRESENT, PREDOMINANTLY PMN ABUNDANT GRAM NEGATIVE RODS RARE GRAM POSITIVE COCCI    Culture   Final    ABUNDANT PSEUDOMONAS AERUGINOSA ABUNDANT PASTEURELLA MULTOCIDA Usually susceptible to penicillin and other beta lactam agents,quinolones,macrolides and tetracyclines. FOR ORGANISM 2 Performed at [facility], [address], [place], [place] [address]    Report Status 12/22/2017 FINAL  Final   Organism ID, Bacteria PSEUDOMONAS AERUGINOSA  Final      Susceptibility   Pseudomonas aeruginosa - MIC*    CEFTAZIDIME 2 SENSITIVE Sensitive     CIPROFLOXACIN <=0.25 SENSITIVE Sensitive     GENTAMICIN <=1 SENSITIVE Sensitive     IMIPENEM 1 SENSITIVE Sensitive     PIP/TAZO <=4 SENSITIVE Sensitive     CEFEPIME <=1 SENSITIVE Sensitive     * ABUNDANT PSEUDOMONAS AERUGINOSA  Blood culture (routine x 2)     Status: None   Collection Time: 12/18/17 12:56 PM  Result Value Ref Range Status   Specimen Description BLOOD RW  Final   Special Requests   Final    BOTTLES DRAWN AEROBIC AND ANAEROBIC Blood Culture adequate volume   Culture   Final    NO GROWTH 5 DAYS Performed at [facility], [address] [place], [place] [address]    Report Status 12/23/2017 FINAL  Final  Blood culture (routine x 2)     Status: None   Collection Time: 12/18/17 12:56 PM  Result Value Ref Range Status   Specimen Description BLOOD LAC  Final    Special Requests   Final    BOTTLES DRAWN AEROBIC AND ANAEROBIC Blood Culture adequate volume   Culture   Final    NO GROWTH 5 DAYS Performed at [facility], [address] [place], [place] [address]    Report Status 12/23/2017 FINAL  Final  MRSA PCR Screening     Status: None   Collection Time: 12/19/17 10:55 AM  Result Value Ref Range Status   MRSA by PCR NEGATIVE NEGATIVE Final    Comment:        The GeneXpert MRSA Assay (FDA approved for NASAL specimens only), is one component of a comprehensive MRSA colonization surveillance program. It is not intended to diagnose MRSA infection nor to guide or monitor treatment for MRSA infections. Performed at [facility], [address] [place], [place] [address]     RADIOLOGY:  No results found.  EKG:   Orders placed or performed during the hospital encounter of 12/18/17  . ED EKG  . ED EKG      Management plans discussed with the patient, family and they are in agreement.  CODE STATUS:     Code Status Orders  (From admission, onward)        Start     Ordered   12/18/17 1458  Full code  Continuous     12/18/17 1457    Code Status History    Date Active Date Inactive Code Status Order ID Comments User Context   11/07/2016 14:27 11/10/2016 15:35 Full Code [id number]  Enedina Finner, MD Inpatient   06/23/2015 17:59 06/24/2015  20:36 Full Code [id number]  Annice Needy, MD Inpatient      TOTAL TIME TAKING CARE OF THIS PATIENT: 45 minutes.   Note: This dictation was prepared with Dragon dictation along with smaller phrase technology. Any transcriptional errors that result from this process are unintentional.   @[facility] @  on 12/23/2017 at 1:33 PM  Between 7am to 6pm - Pager - [phone]  After 6pm go to [email or web address] - password [email or web address] [facility]  [facility]  Office  [phone]  CC: Primary care physician; Tamsen Roers, PA

## 2017-12-23 NOTE — Evaluation (Signed)
Physical Therapy Evaluation Patient Details Name: Hailey Gibbs MRN: [id number] DOB: 1946-11-21 Today's Date: 12/23/2017   History of Present Illness  Pt is a71 y.o.femalewith a known history of chronic COPD, chronic hypoxic respiratory failure and lives on 2 L of oxygen still continues to smoke is presenting to the ED with a chief complaint of worsening shortness of breath.The patient was hypoxemic on 2 L saturating at 88% requiring 4 L of oxygen. Patient was reporting productive cough and could not sleep due to shortness of breath. Chest x-ray has revealed left lower lobe pneumonia.Patient is started on antibiotics and hospitalist team was called to admit the patient. Patient was tachycardic and white blood cell count is elevated.  Assessment includes: sepsis secondary to LLL PNA, acute on chronic respiratory failure, elevated troponin due to demand ischemia, HTN, and chronic COPD without exacerbation.     Clinical Impression  Pt presents with mild deficits in strength, gait, and balance, and moderate deficits in activity tolerance.  Pt on 3LO2/min during session with baseline SpO2 89% and HR 88 bpm.  During amb pt's SpO2 dropped to 82% on 3LO2/min with cues for PLB given and SpO2 returning to baseline in < 60 sec, nursing notified.  Pt did have once instance of min instability during amb without AD but was able to self-correct and reported this is not typical for her.  Educated pt on benefits of temporary use of AD such as a rollator for stability and energy conservation with pt adamantly refusing any AD at this time.  Pt will benefit from HHPT services upon discharge to safely address above deficits for decreased caregiver assistance and eventual return to PLOF.       Follow Up Recommendations Home health PT    Equipment Recommendations  Other (comment)(Pt would benefit from a rollator but adamantly refuses any AD)    Recommendations for Other Services       Precautions /  Restrictions Precautions Precautions: Fall Restrictions Weight Bearing Restrictions: No      Mobility  Bed Mobility Overal bed mobility: Independent                Transfers Overall transfer level: Independent Equipment used: None             General transfer comment: Pt steady with very good eccentric and concentric control  Ambulation/Gait Ambulation/Gait assistance: Supervision Ambulation Distance (Feet): 100 Feet Assistive device: None Gait Pattern/deviations: Step-through pattern;Decreased step length - right;Decreased step length - left   Gait velocity interpretation: Below normal speed for age/gender General Gait Details: Min instability during amb without AD but pt able to self-correct; SpO2 on 3LO2/min down to 82% with HR upper 90s to 100 bpm, minimal SOB, nursing notified.  SpO2 returned to 88-89% in <60 sec upon sitting with PLB cues given.   Stairs            Wheelchair Mobility    Modified Rankin (Stroke Patients Only)       Balance Overall balance assessment: Needs assistance   Sitting balance-Leahy Scale: Normal     Standing balance support: During functional activity;No upper extremity supported Standing balance-Leahy Scale: Fair                               Pertinent Vitals/Pain Pain Assessment: No/denies pain    Home Living Family/patient expects to be discharged to:: Private residence Living Arrangements: Other relatives(Pt lives with granddaughter) Available Help at Discharge:  Family;Available PRN/intermittently Type of Home: House Home Access: Stairs to enter Entrance Stairs-Rails: None Entrance Stairs-Number of Steps: 1 Home Layout: One level Home Equipment: None      Prior Function Level of Independence: Independent         Comments: Ind amb without AD with no fall history, Ind with ADLs, uses 2LO2/min at home     Hand Dominance        Extremity/Trunk Assessment   Upper Extremity  Assessment Upper Extremity Assessment: Overall WFL for tasks assessed    Lower Extremity Assessment Lower Extremity Assessment: Generalized weakness       Communication   Communication: No difficulties  Cognition Arousal/Alertness: Awake/alert Behavior During Therapy: WFL for tasks assessed/performed Overall Cognitive Status: Within Functional Limits for tasks assessed                                        General Comments      Exercises Total Joint Exercises Ankle Circles/Pumps: AROM;Both;10 reps Long Arc Quad: AROM;Both;10 reps Knee Flexion: AROM;Both;10 reps Marching in Standing: AROM;Both;10 reps;Seated Other Exercises Other Exercises: Pt education given for physiological benefits of activity, activity progression including RPE goals, and avoiding dyspnia spiral.   Assessment/Plan    PT Assessment Patient needs continued PT services  PT Problem List Decreased strength;Decreased activity tolerance;Decreased balance       PT Treatment Interventions Gait training;Stair training;Functional mobility training;Neuromuscular re-education;Balance training;Therapeutic exercise;Therapeutic activities;Patient/family education    PT Goals (Current goals can be found in the Care Plan section)  Acute Rehab PT Goals Patient Stated Goal: To get back home to my animals PT Goal Formulation: With patient Time For Goal Achievement: 01/05/18 Potential to Achieve Goals: Good    Frequency Min 2X/week   Barriers to discharge        Co-evaluation               AM-PAC PT "6 Clicks" Daily Activity  Outcome Measure Difficulty turning over in bed (including adjusting bedclothes, sheets and blankets)?: None Difficulty moving from lying on back to sitting on the side of the bed? : None Difficulty sitting down on and standing up from a chair with arms (e.g., wheelchair, bedside commode, etc,.)?: None Help needed moving to and from a bed to chair (including a  wheelchair)?: None Help needed walking in hospital room?: A Little Help needed climbing 3-5 steps with a railing? : A Little 6 Click Score: 22    End of Session Equipment Utilized During Treatment: Gait belt Activity Tolerance: Patient tolerated treatment well Patient left: in bed;with call bell/phone within reach Nurse Communication: Mobility status;Other (comment)(Vitals with activity including SpO2 down to 82% briefly with amb) PT Visit Diagnosis: Difficulty in walking, not elsewhere classified (R26.2);Muscle weakness (generalized) (M62.81)    Time: 0933-1000 PT Time Calculation (min) (ACUTE ONLY): 27 min   Charges:   PT Evaluation $PT Eval Low Complexity: 1 Low PT Treatments $Therapeutic Activity: 8-22 mins   PT G Codes:        Elly Modena. Scott Brittney Caraway PT, DPT 12/23/17, 11:13 AM

## 2017-12-24 ENCOUNTER — Telehealth: Payer: Self-pay | Admitting: Family Medicine

## 2017-12-24 NOTE — Telephone Encounter (Signed)
Pt returned call. Thanks TNP °

## 2017-12-24 NOTE — Telephone Encounter (Signed)
Transition Care Management Follow-Up Telephone Call   Date discharged and where: [facility] on 12/23/17  How have you been since you were released from the hospital? Doing much better, no current s/s.   Any patient concerns? None  Items Reviewed:   Meds: verified  Allergies: verified  Dietary Changes Reviewed: N/A  Functional Questionnaire:  Independent-I Dependent-D  ADLs:   Dressing- I    Eating- I   Maintaining continence- I   Transferring- I   Transportation- I   Meal Prep- I   Managing Meds- I  Confirmed importance and Date/Time of follow-up visits scheduled: 12/31/17 @ 10:00 AM   Confirmed with patient if condition worsens to call PCP or go to the Emergency Dept. Patient was given office number and encouraged to call back with questions or concerns: YES

## 2017-12-24 NOTE — Telephone Encounter (Signed)
Pt is scheduled for hospital f/u 12/31/17. Pt was discharged 12/23/17 and was treated for pneumonia. Please advise. Thanks TNP

## 2017-12-24 NOTE — Telephone Encounter (Signed)
Schedule follow up

## 2017-12-25 NOTE — Telephone Encounter (Signed)
Pt is scheduled for a follow up on 12/31/17 @ 10:00 AM. Thanks, MM

## 2017-12-31 ENCOUNTER — Encounter: Payer: Self-pay | Admitting: Family Medicine

## 2017-12-31 ENCOUNTER — Other Ambulatory Visit: Payer: Self-pay

## 2017-12-31 ENCOUNTER — Ambulatory Visit (INDEPENDENT_AMBULATORY_CARE_PROVIDER_SITE_OTHER): Payer: Medicare HMO | Admitting: Family Medicine

## 2017-12-31 VITALS — BP 98/54 | HR 100 | Temp 98.3°F | Wt 140.0 lb

## 2017-12-31 DIAGNOSIS — R0902 Hypoxemia: Secondary | ICD-10-CM

## 2017-12-31 DIAGNOSIS — R0989 Other specified symptoms and signs involving the circulatory and respiratory systems: Secondary | ICD-10-CM | POA: Diagnosis not present

## 2017-12-31 DIAGNOSIS — J151 Pneumonia due to Pseudomonas: Secondary | ICD-10-CM

## 2017-12-31 DIAGNOSIS — D509 Iron deficiency anemia, unspecified: Secondary | ICD-10-CM

## 2017-12-31 DIAGNOSIS — J449 Chronic obstructive pulmonary disease, unspecified: Secondary | ICD-10-CM | POA: Diagnosis not present

## 2017-12-31 NOTE — Progress Notes (Signed)
Hailey Gibbs  MRN: [id number] DOB: 12-24-1945  Subjective:  HPI   The patient is a 72 year old female who presents today for follow up after hospital admission.  She was admitted to [facility] on 12/18/17 and discharged on 12/23/17. Her primary diagnosis was Sepsis and Community acquired pneumonia of the LLL along with Hypoxia, COPD exacerbation and elevated Troponin levels. She was discharged with instructions to be on Oxygen at 3 LPM, activity as tolerated, Cardiac diet and the following medications: Medication List     TAKE these medications   acetaminophen 500 MG tablet Commonly known as:  TYLENOL Take 1,000 mg by mouth every 6 (six) hours as needed for mild pain or headache.   albuterol 108 (90 Base) MCG/ACT inhaler Commonly known as:  VENTOLIN HFA INHALE 1-2 PUFFS 4 TIMES A DAY AS NEEDED FOR WHEEZING   amLODipine 10 MG tablet Commonly known as:  NORVASC TAKE 1 TABLET (10 MG TOTAL) BY MOUTH DAILY.   aspirin 81 MG chewable tablet Chew 81 mg by mouth at bedtime.   atorvastatin 10 MG tablet Commonly known as:  LIPITOR Take 1 tablet (10 mg total) by mouth daily.   cetirizine 10 MG tablet Commonly known as:  ZYRTEC Take 10 mg by mouth at bedtime.   ciprofloxacin 500 MG tablet Commonly known as:  CIPRO Take 1 tablet (500 mg total) by mouth 2 (two) times daily.   metoprolol tartrate 25 MG tablet Commonly known as:  LOPRESSOR Take 1 tablet (25 mg total) by mouth 2 (two) times daily. What changed:  when to take this   multivitamin tablet Take 1 tablet by mouth at bedtime.   nicotine 7 mg/24hr patch Commonly known as:  NICODERM CQ - dosed in mg/24 hr Place 1 patch (7 mg total) onto the skin daily. Start taking on:  12/24/2017   sertraline 100 MG tablet Commonly known as:  ZOLOFT 1 (ONE) TABLET, ORAL, AT BEDTIME DAILY BY MOUTH   SPIRIVA HANDIHALER 18 MCG inhalation capsule Generic drug:  tiotropium INHALE 1 CAPSULE ONCE A DAY     SYMBICORT 160-4.5 MCG/ACT inhaler Generic drug:  budesonide-formoterol INHALE 2 PUFFS INTO THE LUNGS 2 (TWO) TIMES DAILY.   Vitamin D3 2000 units Tabs Take by mouth daily.   vitamin E 400 UNIT capsule Take 400 Units by mouth daily.     The patient reports that she finished her antibiotic yesterday and she never did use the Nicotine patches.  She continues to smoke.   Patient Active Problem List   Diagnosis Date Noted  . Sepsis (HCC) 11/07/2016  . Chronic obstructive pulmonary disease with hypoxia (HCC) 10/26/2016  . Abnormal chest sounds 08/29/2015  . Obstructive chronic bronchitis with exacerbation (HCC) 08/29/2015  . Anxiety and depression 08/29/2015  . BP (high blood pressure) 08/29/2015  . Carotid stenosis 06/23/2015  . Pre-operative cardiovascular examination 05/30/2015  . Tobacco use 05/30/2015  . Hyperlipidemia     Past Medical History:  Diagnosis Date  . Anxiety   . Carotid artery occlusion   . COPD (chronic obstructive pulmonary disease) (HCC)   . Depression   . GERD (gastroesophageal reflux disease)   . Hyperlipidemia   . Hypertension   . Seasonal allergies   . Shortness of breath dyspnea     Social History   Socioeconomic History  . Marital status: Divorced    Spouse name: Not on file  . Number of children: One son living with POA for her.  . Years of education:  Not on file  . Highest education level: Not on file  Social Needs  . Financial resource strain: No strain with grand daughter and boyfriend living with her and helping financially.  . Food insecurity - worry: Not on file  . Food insecurity - inability: Not on file  . Transportation needs - medical: Not on file  . Transportation needs - non-medical: Not on file  Occupational History  . Not on file  Tobacco Use  . Smoking status: Current Every Day Smoker    Packs/day: 0.50    Years: 45.00    Pack years: 22.50    Types: Cigarettes  . Smokeless tobacco: Never Used  . Tobacco comment:  pt uses nictine patches occasioanlly   Substance and Sexual Activity  . Alcohol use: No    Alcohol/week: 0.0 oz  . Drug use: No  . Sexual activity: Not on file  Other Topics Concern  . Not on file  Social History Narrative  . Not on file   Past Surgical History:  Procedure Laterality Date  . ENDARTERECTOMY Left 06/23/2015   Procedure: ENDARTERECTOMY CAROTID;  Surgeon: Annice NeedyJason S Dew, MD;  Location: [facility] ORS;  Service: Vascular;  Laterality: Left;  . TUBAL LIGATION    . VAGINAL HYSTERECTOMY     Family History  Problem Relation Age of Onset  . Heart disease Father [id number]       CABG   . Hyperlipidemia Father   . Pneumonia Father   . Dementia Mother   . COPD Mother   . Diabetes Sister   . Parkinson's disease Brother   . Post-traumatic stress disorder Son    Outpatient Encounter Medications as of 12/31/2017  Medication Sig Note  . acetaminophen (TYLENOL) 500 MG tablet Take 1,000 mg by mouth every 6 (six) hours as needed for mild pain or headache.    . albuterol (VENTOLIN HFA) 108 (90 Base) MCG/ACT inhaler INHALE 1-2 PUFFS 4 TIMES A DAY AS NEEDED FOR WHEEZING   . amLODipine (NORVASC) 10 MG tablet TAKE 1 TABLET (10 MG TOTAL) BY MOUTH DAILY.   Marland Kitchen. aspirin 81 MG chewable tablet Chew 81 mg by mouth at bedtime.    Marland Kitchen. atorvastatin (LIPITOR) 10 MG tablet Take 1 tablet (10 mg total) by mouth daily.   . B Complex-C-Folic Acid (HM SUPER VITAMIN B COMPLEX/C PO) Take by mouth.   . calcium carbonate (OSCAL) 1500 (600 Ca) MG TABS tablet Take 600 mg of elemental calcium by mouth daily with breakfast.   . cetirizine (ZYRTEC) 10 MG tablet Take 10 mg by mouth at bedtime.    . Cholecalciferol (VITAMIN D3) 2000 units TABS Take by mouth daily.   . Multiple Vitamin (MULTIVITAMIN) tablet Take 1 tablet by mouth at bedtime.    . sertraline (ZOLOFT) 100 MG tablet 1 (ONE) TABLET, ORAL, AT BEDTIME DAILY BY MOUTH   . SPIRIVA HANDIHALER 18 MCG inhalation capsule INHALE 1 CAPSULE ONCE A DAY   . SYMBICORT 160-4.5 MCG/ACT  inhaler INHALE 2 PUFFS INTO THE LUNGS 2 (TWO) TIMES DAILY.   . vitamin E 400 UNIT capsule Take 400 Units by mouth daily.   . ciprofloxacin (CIPRO) 500 MG tablet Take 1 tablet (500 mg total) by mouth 2 (two) times daily. (Patient not taking: Reported on 12/31/2017) 12/31/2017: Patient finished  . metoprolol tartrate (LOPRESSOR) 25 MG tablet Take 1 tablet (25 mg total) by mouth 2 (two) times daily. (Patient not taking: Reported on 12/24/2017)    No facility-administered encounter medications on file as of  12/31/2017.     Allergies  Allergen Reactions  . Codeine Anxiety         Review of Systems  Constitutional: Negative for fever, malaise/fatigue and weight loss.  Respiratory: Negative for cough, shortness of breath and wheezing.   Cardiovascular: Negative for chest pain, palpitations, orthopnea, claudication and leg swelling.  Neurological: Negative for weakness.    Objective:  BP (!) 98/54 (BP Location: Right Arm, Patient Position: Sitting, Cuff Size: Normal)   Pulse 100   Temp 98.3 F (36.8 C) (Oral)   Wt 140 lb (63.5 kg)   SpO2 97%   BMI 23.30 kg/m   Physical Exam  Constitutional: She is oriented to person, place, and time and well-developed, well-nourished, and in no distress.  HENT:  Head: Normocephalic.  Mouth/Throat: Oropharynx is clear and moist.  Eyes: Conjunctivae are normal.  Neck: Neck supple.  Cardiovascular: Normal rate.  Borderline heart rate. Right carotid bruit.  Pulmonary/Chest:  Very distant breath sounds without rales or wheezes today.   Abdominal: Soft. Bowel sounds are normal.  Neurological: She is alert and oriented to person, place, and time.  Psychiatric: Affect and judgment normal.    Assessment and Plan :  1. Pneumonia of left lower lobe due to Pseudomonas species [facility]) Hospitalized with worsening of hypoxia, shortness of breath and cough on 12-18-17. Pulse oximetry was 88% on 2 LPM and x-rays showed LLL pneumonia. Sputum cultures isolated  pseudomonas with pasteurella. She was treated with Zosyn after Vancomycin and discharged on Ciprofloxacin. She is feeling much improved and eating well. She finished the Ciprofloxacin 3 days ago and lungs sound clear today.  Continued oxygen at 3 LPM on discharge but feels better at 2 LPM now. Will recheck CBC and CMP. Follow up appointment in 2 weeks to get recheck of CXR. - CBC with Differential/Platelet - Comprehensive metabolic panel  2. Chronic obstructive pulmonary disease with hypoxia (HCC) Was discharged on 4 LPM oxygen by nasal cannula with Dulera, Albuterol and Spiriva inhalation therapy. Felt the 4LPM pressure was too much and has lowered use down to 2 LPM. Checks pulse oximetry at home and stays stable around 96-97% on the 2 LPM continuously. Will continue this regimen and follow up in 2 weeks.  3. Bruit of right carotid artery Continues to have right carotid bruit present. Has a history of left carotid endarterectomy by Dr. Wyn Quaker (vascular) and has a follow up appointment with him on 02-11-18. Denies significant dizziness other than fatigue associated with anemia and pneumonia with hypoxia.  4. Iron deficiency anemia, unspecified iron deficiency anemia type Hgb was down to 9.9 on 12-21-17 while in the hospital. No specific evidence of blood loss documented. Suspect due to chronic disease. Will continue present OTC iron once a day and recheck CBC with iron levels. - CBC with Differential/Platelet - Iron - Iron and TIBC

## 2018-01-01 LAB — COMPREHENSIVE METABOLIC PANEL
ALBUMIN: 4.2 g/dL (ref 3.5–4.8)
ALT: 19 IU/L (ref 0–32)
AST: 14 IU/L (ref 0–40)
Albumin/Globulin Ratio: 1.6 (ref 1.2–2.2)
Alkaline Phosphatase: 136 IU/L — ABNORMAL HIGH (ref 39–117)
BUN / CREAT RATIO: 12 (ref 12–28)
BUN: 7 mg/dL — AB (ref 8–27)
CHLORIDE: 99 mmol/L (ref 96–106)
CO2: 30 mmol/L — ABNORMAL HIGH (ref 20–29)
Calcium: 9.8 mg/dL (ref 8.7–10.3)
Creatinine, Ser: 0.59 mg/dL (ref 0.57–1.00)
GFR calc Af Amer: 107 mL/min/{1.73_m2} (ref >59)
GFR calc non Af Amer: 93 mL/min/{1.73_m2} (ref >59)
GLUCOSE: 100 mg/dL — AB (ref 65–99)
Globulin, Total: 2.6 g/dL (ref 1.5–4.5)
Potassium: 5.4 mmol/L — ABNORMAL HIGH (ref 3.5–5.2)
Sodium: 142 mmol/L (ref 134–144)
Total Protein: 6.8 g/dL (ref 6.0–8.5)

## 2018-01-01 LAB — CBC WITH DIFFERENTIAL/PLATELET
BASOS ABS: 0 10*3/uL (ref 0.0–0.2)
Basos: 0 %
EOS (ABSOLUTE): 0.3 10*3/uL (ref 0.0–0.4)
EOS: 3 %
HEMATOCRIT: 35.1 % (ref 34.0–46.6)
HEMOGLOBIN: 11 g/dL — AB (ref 11.1–15.9)
IMMATURE GRANS (ABS): 0.1 10*3/uL (ref 0.0–0.1)
Immature Granulocytes: 1 %
Lymphocytes Absolute: 2.1 10*3/uL (ref 0.7–3.1)
Lymphs: 20 %
MCH: 30.1 pg (ref 26.6–33.0)
MCHC: 31.3 g/dL — AB (ref 31.5–35.7)
MCV: 96 fL (ref 79–97)
MONOCYTES: 7 %
Monocytes Absolute: 0.7 10*3/uL (ref 0.1–0.9)
NEUTROS PCT: 69 %
Neutrophils Absolute: 6.9 10*3/uL (ref 1.4–7.0)
Platelets: 549 10*3/uL — ABNORMAL HIGH (ref 150–379)
RBC: 3.65 x10E6/uL — AB (ref 3.77–5.28)
RDW: 13.5 % (ref 12.3–15.4)
WBC: 10.1 10*3/uL (ref 3.4–10.8)

## 2018-01-01 LAB — IRON AND TIBC
IRON SATURATION: 9 % — AB (ref 15–55)
IRON: 26 ug/dL — AB (ref 27–139)
TIBC: 303 ug/dL (ref 250–450)
UIBC: 277 ug/dL (ref 118–369)

## 2018-01-02 NOTE — Progress Notes (Signed)
Hgb improved from hospitalization readings but Iron saturation level low. Continue Iron supplement daily and recheck levels in 3 months.

## 2018-01-05 DIAGNOSIS — R0902 Hypoxemia: Secondary | ICD-10-CM | POA: Diagnosis not present

## 2018-01-05 DIAGNOSIS — J449 Chronic obstructive pulmonary disease, unspecified: Secondary | ICD-10-CM | POA: Diagnosis not present

## 2018-01-14 ENCOUNTER — Ambulatory Visit
Admission: RE | Admit: 2018-01-14 | Discharge: 2018-01-14 | Disposition: A | Discharge status: Home / Self Care | Payer: Medicare HMO | Source: Ambulatory Visit | Attending: Family Medicine | Admitting: Family Medicine

## 2018-01-14 ENCOUNTER — Encounter: Payer: Self-pay | Admitting: Family Medicine

## 2018-01-14 ENCOUNTER — Ambulatory Visit (INDEPENDENT_AMBULATORY_CARE_PROVIDER_SITE_OTHER): Payer: Medicare HMO | Admitting: Family Medicine

## 2018-01-14 VITALS — BP 110/58 | HR 94 | Temp 98.2°F | Wt 140.0 lb

## 2018-01-14 DIAGNOSIS — J069 Acute upper respiratory infection, unspecified: Secondary | ICD-10-CM

## 2018-01-14 DIAGNOSIS — R0603 Acute respiratory distress: Secondary | ICD-10-CM | POA: Diagnosis not present

## 2018-01-14 DIAGNOSIS — J449 Chronic obstructive pulmonary disease, unspecified: Secondary | ICD-10-CM | POA: Diagnosis not present

## 2018-01-14 DIAGNOSIS — R0902 Hypoxemia: Secondary | ICD-10-CM

## 2018-01-14 DIAGNOSIS — R918 Other nonspecific abnormal finding of lung field: Secondary | ICD-10-CM | POA: Insufficient documentation

## 2018-01-14 DIAGNOSIS — A419 Sepsis, unspecified organism: Secondary | ICD-10-CM | POA: Diagnosis not present

## 2018-01-14 DIAGNOSIS — J984 Other disorders of lung: Secondary | ICD-10-CM

## 2018-01-14 DIAGNOSIS — J181 Lobar pneumonia, unspecified organism: Secondary | ICD-10-CM | POA: Diagnosis not present

## 2018-01-14 DIAGNOSIS — Z8701 Personal history of pneumonia (recurrent): Secondary | ICD-10-CM

## 2018-01-14 MED ORDER — AZITHROMYCIN 250 MG PO TABS: ORAL_TABLET | ORAL | 0 refills | Status: DC

## 2018-01-14 NOTE — Progress Notes (Signed)
Patient: Hailey Gibbs Female    DOB: 18-Jun-1946   72 y.o.   MRN: [id number] Visit Date: 01/14/2018  Today's Provider: Dortha Kern, PA   Chief Complaint  Patient presents with  . Pneumonia    follow up   Subjective:    HPI Pneumonia of left lower lobe: Patient presents for a 2 week follow up. Last OV was on 12/31/17. Labs showed Hgb improved but iron saturation was low. Patient advised to continue iron supplements and follow up in 2 weeks for a repeat CXR. Patient states she has some congestion and cough the past 1 week. She has been using DayQuil for symptoms with mild relief.   Past Medical History:  Diagnosis Date  . Anxiety   . Carotid artery occlusion   . COPD (chronic obstructive pulmonary disease) (HCC)   . Depression   . GERD (gastroesophageal reflux disease)   . Hyperlipidemia   . Hypertension   . Seasonal allergies   . Shortness of breath dyspnea    Patient Active Problem List   Diagnosis Date Noted  . Sepsis (HCC) 11/07/2016  . Chronic obstructive pulmonary disease with hypoxia (HCC) 10/26/2016  . Abnormal chest sounds 08/29/2015  . Obstructive chronic bronchitis with exacerbation (HCC) 08/29/2015  . Anxiety and depression 08/29/2015  . BP (high blood pressure) 08/29/2015  . Carotid stenosis 06/23/2015  . Pre-operative cardiovascular examination 05/30/2015  . Tobacco use 05/30/2015  . Hyperlipidemia    Past Surgical History:  Procedure Laterality Date  . ENDARTERECTOMY Left 06/23/2015   Procedure: ENDARTERECTOMY CAROTID;  Surgeon: Annice Needy, MD;  Location: [facility] ORS;  Service: Vascular;  Laterality: Left;  . TUBAL LIGATION    . VAGINAL HYSTERECTOMY     Family History  Problem Relation Age of Onset  . Heart disease Father 84       CABG   . Hyperlipidemia Father   . Pneumonia Father   . Dementia Mother   . COPD Mother   . Diabetes Sister   . Parkinson's disease Brother   . Post-traumatic stress disorder Son    Allergies  Allergen  Reactions  . Codeine Anxiety         Current Outpatient Medications:  .  acetaminophen (TYLENOL) 500 MG tablet, Take 1,000 mg by mouth every 6 (six) hours as needed for mild pain or headache. , Disp: , Rfl:  .  albuterol (VENTOLIN HFA) 108 (90 Base) MCG/ACT inhaler, INHALE 1-2 PUFFS 4 TIMES A DAY AS NEEDED FOR WHEEZING, Disp: 18 Inhaler, Rfl: 2 .  amLODipine (NORVASC) 10 MG tablet, TAKE 1 TABLET (10 MG TOTAL) BY MOUTH DAILY., Disp: 90 tablet, Rfl: 3 .  aspirin 81 MG chewable tablet, Chew 81 mg by mouth at bedtime. , Disp: , Rfl:  .  atorvastatin (LIPITOR) 10 MG tablet, Take 1 tablet (10 mg total) by mouth daily., Disp: 90 tablet, Rfl: 3 .  B Complex-C-Folic Acid (HM SUPER VITAMIN B COMPLEX/C PO), Take by mouth., Disp: , Rfl:  .  calcium carbonate (OSCAL) 1500 (600 Ca) MG TABS tablet, Take 600 mg of elemental calcium by mouth daily with breakfast., Disp: , Rfl:  .  cetirizine (ZYRTEC) 10 MG tablet, Take 10 mg by mouth at bedtime. , Disp: , Rfl:  .  Cholecalciferol (VITAMIN D3) 2000 units TABS, Take by mouth daily., Disp: , Rfl:  .  Multiple Vitamin (MULTIVITAMIN) tablet, Take 1 tablet by mouth at bedtime. , Disp: , Rfl:  .  sertraline (ZOLOFT)  100 MG tablet, 1 (ONE) TABLET, ORAL, AT BEDTIME DAILY BY MOUTH, Disp: 90 tablet, Rfl: 3 .  SPIRIVA HANDIHALER 18 MCG inhalation capsule, INHALE 1 CAPSULE ONCE A DAY, Disp: 30 capsule, Rfl: 11 .  SYMBICORT 160-4.5 MCG/ACT inhaler, INHALE 2 PUFFS INTO THE LUNGS 2 (TWO) TIMES DAILY., Disp: 10.2 Inhaler, Rfl: 3 .  vitamin E 400 UNIT capsule, Take 400 Units by mouth daily., Disp: , Rfl:   Review of Systems  Social History   Tobacco Use  . Smoking status: Current Every Day Smoker    Packs/day: 0.50    Years: 45.00    Pack years: 22.50    Types: Cigarettes  . Smokeless tobacco: Never Used  . Tobacco comment: pt uses nictine patches occasioanlly   Substance Use Topics  . Alcohol use: No    Alcohol/week: 0.0 oz   Objective:   BP (!) 110/58 (BP  Location: Right Arm, Patient Position: Sitting, Cuff Size: Normal)   Pulse 94   Temp 98.2 F (36.8 C) (Oral)   Wt 140 lb (63.5 kg)   SpO2 95%   BMI 23.30 kg/m    Physical Exam  Constitutional: She is oriented to person, place, and time. She appears well-developed and well-nourished.  HENT:  Head: Normocephalic.  Right Ear: External ear normal.  Slightly cobblestoning of posterior pharynx with yellow thick nasal mucus and slight erythema.  Eyes: Conjunctivae are normal.  Neck: Neck supple.  Cardiovascular: Normal rate.  Pulmonary/Chest: She is in respiratory distress. She has no rales.  Distant breath sounds. Some dyspnea and presently on oxygen at 2 LPM.  Abdominal: Soft. Bowel sounds are normal.  Lymphadenopathy:    She has no cervical adenopathy.  Neurological: She is alert and oriented to person, place, and time.      Assessment & Plan:     1. URI with cough and congestion Onset over the past week with stuffy head, PND, scratchy throat and occasional non-productive cough. No fever. May use OTC Dayquil and add Z-pak. Continue regular COPD treatment regimen. Still smoking and doubt she will ever quit. Recheck if no better in 5-7 days. - azithromycin (ZITHROMAX) 250 MG tablet; Take 2 tablets by mouth today then one tablet daily for 4 days.  Dispense: 6 tablet; Refill: 0 - DG Chest 2 View  2. Chronic obstructive pulmonary disease with hypoxia (HCC) Nasal stuffiness causes her to mouth breath and that limits oxygen intake (uses nasal cannula with oxygen at 2 LPM 24 hrs a day). Still taking Spiriva qd, Symbicort 160/4.5 mcg/act 2 puffs BID and Ventolin-HFA 2 puffs QID prn rescue. Pulse oximetry was 97% on 12-31-17 with oxygen 2 LPM and 95% today. Will treat URI with antibiotic to prevent progression to flare of chronic bronchitis. Recheck in 2 months.  3. History of Pseudomonas pneumonia Feels chest is clearer. Check CXR for resolution of LLL pneumonia. - DG Chest 2 View        Dortha Kernennis Jamyia Fortune, [place]  [facility]

## 2018-01-16 ENCOUNTER — Telehealth: Payer: Self-pay

## 2018-01-16 NOTE — Telephone Encounter (Signed)
Patient advised. She verbalized understanding.  

## 2018-01-16 NOTE — Telephone Encounter (Signed)
-----   Message from Tamsen Roersennis E Chrismon, [place] sent at 01/14/2018  5:26 PM EST ----- Chest x-ray shows improvement. Still some scarring. Recheck in a week if congestion not better with the Z-pak.

## 2018-01-17 ENCOUNTER — Inpatient Hospital Stay
Admission: EM | Admit: 2018-01-17 | Discharge: 2018-01-21 | DRG: 871 | Disposition: A | Discharge status: Home / Self Care | Payer: Medicare HMO | Attending: Family Medicine | Admitting: Family Medicine

## 2018-01-17 ENCOUNTER — Emergency Department: Payer: Medicare HMO

## 2018-01-17 ENCOUNTER — Encounter: Payer: Self-pay | Admitting: Emergency Medicine

## 2018-01-17 ENCOUNTER — Other Ambulatory Visit: Payer: Self-pay

## 2018-01-17 DIAGNOSIS — A419 Sepsis, unspecified organism: Secondary | ICD-10-CM | POA: Diagnosis not present

## 2018-01-17 DIAGNOSIS — Y95 Nosocomial condition: Secondary | ICD-10-CM

## 2018-01-17 DIAGNOSIS — J189 Pneumonia, unspecified organism: Secondary | ICD-10-CM | POA: Diagnosis present

## 2018-01-17 DIAGNOSIS — Z515 Encounter for palliative care: Secondary | ICD-10-CM

## 2018-01-17 DIAGNOSIS — F419 Anxiety disorder, unspecified: Secondary | ICD-10-CM | POA: Diagnosis present

## 2018-01-17 DIAGNOSIS — F17210 Nicotine dependence, cigarettes, uncomplicated: Secondary | ICD-10-CM | POA: Diagnosis present

## 2018-01-17 DIAGNOSIS — Z833 Family history of diabetes mellitus: Secondary | ICD-10-CM

## 2018-01-17 DIAGNOSIS — N390 Urinary tract infection, site not specified: Secondary | ICD-10-CM | POA: Diagnosis present

## 2018-01-17 DIAGNOSIS — J9600 Acute respiratory failure, unspecified whether with hypoxia or hypercapnia: Secondary | ICD-10-CM

## 2018-01-17 DIAGNOSIS — F329 Major depressive disorder, single episode, unspecified: Secondary | ICD-10-CM | POA: Diagnosis present

## 2018-01-17 DIAGNOSIS — J9621 Acute and chronic respiratory failure with hypoxia: Secondary | ICD-10-CM | POA: Diagnosis not present

## 2018-01-17 DIAGNOSIS — J441 Chronic obstructive pulmonary disease with (acute) exacerbation: Secondary | ICD-10-CM | POA: Diagnosis not present

## 2018-01-17 DIAGNOSIS — K219 Gastro-esophageal reflux disease without esophagitis: Secondary | ICD-10-CM | POA: Diagnosis present

## 2018-01-17 DIAGNOSIS — I10 Essential (primary) hypertension: Secondary | ICD-10-CM | POA: Diagnosis not present

## 2018-01-17 DIAGNOSIS — J9622 Acute and chronic respiratory failure with hypercapnia: Secondary | ICD-10-CM | POA: Diagnosis present

## 2018-01-17 DIAGNOSIS — Z820 Family history of epilepsy and other diseases of the nervous system: Secondary | ICD-10-CM | POA: Diagnosis not present

## 2018-01-17 DIAGNOSIS — J449 Chronic obstructive pulmonary disease, unspecified: Secondary | ICD-10-CM | POA: Diagnosis not present

## 2018-01-17 DIAGNOSIS — Z90710 Acquired absence of both cervix and uterus: Secondary | ICD-10-CM

## 2018-01-17 DIAGNOSIS — Z7189 Other specified counseling: Secondary | ICD-10-CM

## 2018-01-17 DIAGNOSIS — E785 Hyperlipidemia, unspecified: Secondary | ICD-10-CM | POA: Diagnosis present

## 2018-01-17 DIAGNOSIS — J440 Chronic obstructive pulmonary disease with acute lower respiratory infection: Secondary | ICD-10-CM | POA: Diagnosis present

## 2018-01-17 DIAGNOSIS — Z8249 Family history of ischemic heart disease and other diseases of the circulatory system: Secondary | ICD-10-CM

## 2018-01-17 DIAGNOSIS — R000 Tachycardia, unspecified: Secondary | ICD-10-CM | POA: Diagnosis not present

## 2018-01-17 DIAGNOSIS — Z8349 Family history of other endocrine, nutritional and metabolic diseases: Secondary | ICD-10-CM | POA: Diagnosis not present

## 2018-01-17 DIAGNOSIS — R0603 Acute respiratory distress: Secondary | ICD-10-CM

## 2018-01-17 DIAGNOSIS — J9601 Acute respiratory failure with hypoxia: Secondary | ICD-10-CM | POA: Diagnosis not present

## 2018-01-17 DIAGNOSIS — R0902 Hypoxemia: Secondary | ICD-10-CM | POA: Diagnosis not present

## 2018-01-17 DIAGNOSIS — Z825 Family history of asthma and other chronic lower respiratory diseases: Secondary | ICD-10-CM

## 2018-01-17 DIAGNOSIS — Z9981 Dependence on supplemental oxygen: Secondary | ICD-10-CM

## 2018-01-17 DIAGNOSIS — Z7982 Long term (current) use of aspirin: Secondary | ICD-10-CM | POA: Diagnosis not present

## 2018-01-17 DIAGNOSIS — F32A Depression, unspecified: Secondary | ICD-10-CM | POA: Diagnosis present

## 2018-01-17 DIAGNOSIS — Z885 Allergy status to narcotic agent status: Secondary | ICD-10-CM

## 2018-01-17 DIAGNOSIS — R0602 Shortness of breath: Secondary | ICD-10-CM | POA: Diagnosis not present

## 2018-01-17 DIAGNOSIS — R0600 Dyspnea, unspecified: Secondary | ICD-10-CM | POA: Diagnosis not present

## 2018-01-17 DIAGNOSIS — J9690 Respiratory failure, unspecified, unspecified whether with hypoxia or hypercapnia: Secondary | ICD-10-CM | POA: Diagnosis not present

## 2018-01-17 LAB — INFLUENZA PANEL BY PCR (TYPE A & B)
INFLBPCR: NEGATIVE
Influenza A By PCR: NEGATIVE

## 2018-01-17 LAB — URINALYSIS, COMPLETE (UACMP) WITH MICROSCOPIC
BILIRUBIN URINE: NEGATIVE
Glucose, UA: NEGATIVE mg/dL
Hgb urine dipstick: NEGATIVE
KETONES UR: 5 mg/dL — AB
Leukocytes, UA: NEGATIVE
Nitrite: POSITIVE — AB
PH: 6 (ref 5.0–8.0)
PROTEIN: 30 mg/dL — AB
SQUAMOUS EPITHELIAL / LPF: NONE SEEN
Specific Gravity, Urine: 1.02 (ref 1.005–1.030)

## 2018-01-17 LAB — PROTIME-INR
INR: 0.92
Prothrombin Time: 12.3 seconds (ref 11.4–15.2)

## 2018-01-17 LAB — CBC WITH DIFFERENTIAL/PLATELET
BASOS PCT: 0 %
Basophils Absolute: 0.1 10*3/uL (ref 0–0.1)
Eosinophils Absolute: 0 10*3/uL (ref 0–0.7)
Eosinophils Relative: 0 %
HEMATOCRIT: 36.3 % (ref 35.0–47.0)
Hemoglobin: 11.6 g/dL — ABNORMAL LOW (ref 12.0–16.0)
LYMPHS ABS: 0.5 10*3/uL — AB (ref 1.0–3.6)
Lymphocytes Relative: 2 %
MCH: 29.7 pg (ref 26.0–34.0)
MCHC: 31.9 g/dL — AB (ref 32.0–36.0)
MCV: 93.1 fL (ref 80.0–100.0)
MONOS PCT: 3 %
Monocytes Absolute: 0.9 10*3/uL (ref 0.2–0.9)
NEUTROS ABS: 26.8 10*3/uL — AB (ref 1.4–6.5)
Neutrophils Relative %: 95 %
Platelets: 324 10*3/uL (ref 150–440)
RBC: 3.9 MIL/uL (ref 3.80–5.20)
RDW: 14.1 % (ref 11.5–14.5)
WBC: 28.2 10*3/uL — ABNORMAL HIGH (ref 3.6–11.0)

## 2018-01-17 LAB — TROPONIN I: Troponin I: <0.03 ng/mL (ref <0.03)

## 2018-01-17 LAB — COMPREHENSIVE METABOLIC PANEL
ALBUMIN: 3.8 g/dL (ref 3.5–5.0)
ALK PHOS: 99 U/L (ref 38–126)
ALT: 15 U/L (ref 14–54)
AST: 20 U/L (ref 15–41)
Anion gap: 12 (ref 5–15)
BUN: 13 mg/dL (ref 6–20)
CO2: 29 mmol/L (ref 22–32)
Calcium: 9.1 mg/dL (ref 8.9–10.3)
Chloride: 95 mmol/L — ABNORMAL LOW (ref 101–111)
Creatinine, Ser: 0.6 mg/dL (ref 0.44–1.00)
GFR calc Af Amer: >60 mL/min (ref >60)
GFR calc non Af Amer: >60 mL/min (ref >60)
GLUCOSE: 143 mg/dL — AB (ref 65–99)
Potassium: 3.4 mmol/L — ABNORMAL LOW (ref 3.5–5.1)
Sodium: 136 mmol/L (ref 135–145)
TOTAL PROTEIN: 7.2 g/dL (ref 6.5–8.1)
Total Bilirubin: 0.6 mg/dL (ref 0.3–1.2)

## 2018-01-17 LAB — BLOOD GAS, VENOUS
Acid-Base Excess: 7.7 mmol/L — ABNORMAL HIGH (ref 0.0–2.0)
BICARBONATE: 35 mmol/L — AB (ref 20.0–28.0)
PCO2 VEN: 62 mmHg — AB (ref 44.0–60.0)
PH VEN: 7.36 (ref 7.250–7.430)
Patient temperature: 37

## 2018-01-17 LAB — BRAIN NATRIURETIC PEPTIDE: B NATRIURETIC PEPTIDE 5: 109 pg/mL — AB (ref 0.0–100.0)

## 2018-01-17 LAB — LACTIC ACID, PLASMA: Lactic Acid, Venous: 1 mmol/L (ref 0.5–1.9)

## 2018-01-17 MED ORDER — PIPERACILLIN-TAZOBACTAM 3.375 G IVPB 30 MIN
3.3750 g | Freq: Once | INTRAVENOUS | Status: AC
  Administered 2018-01-17: 3.375 g via INTRAVENOUS
  Filled 2018-01-17: qty 50

## 2018-01-17 MED ORDER — VANCOMYCIN HCL IN DEXTROSE 1-5 GM/200ML-% IV SOLN
1000.0000 mg | INTRAVENOUS | Status: DC
  Administered 2018-01-18 – 2018-01-19 (×2): 1000 mg via INTRAVENOUS
  Filled 2018-01-17 (×3): qty 200

## 2018-01-17 MED ORDER — VANCOMYCIN HCL 10 G IV SOLR
1250.0000 mg | Freq: Once | INTRAVENOUS | Status: AC
  Administered 2018-01-17: 1250 mg via INTRAVENOUS
  Filled 2018-01-17: qty 1250

## 2018-01-17 MED ORDER — PIPERACILLIN-TAZOBACTAM 3.375 G IVPB
3.3750 g | Freq: Three times a day (TID) | INTRAVENOUS | Status: DC
  Administered 2018-01-18 – 2018-01-20 (×7): 3.375 g via INTRAVENOUS
  Filled 2018-01-17 (×7): qty 50

## 2018-01-17 MED ORDER — SODIUM CHLORIDE 0.9 % IV BOLUS (SEPSIS)
1000.0000 mL | Freq: Once | INTRAVENOUS | Status: AC
  Administered 2018-01-17: 1000 mL via INTRAVENOUS

## 2018-01-17 MED ORDER — ONDANSETRON HCL 4 MG/2ML IJ SOLN
4.0000 mg | Freq: Once | INTRAMUSCULAR | Status: DC
  Filled 2018-01-17: qty 2

## 2018-01-17 NOTE — Progress Notes (Signed)
CODE SEPSIS - PHARMACY COMMUNICATION  **Broad Spectrum Antibiotics should be administered within 1 hour of Sepsis diagnosis**  Time Code Sepsis Called/Page Received: 2115  Antibiotics Ordered: Vancomycin, Zosyn   Time of 1st antibiotic administration:  21:50   Additional action taken by pharmacy: None   If necessary, Name of Provider/Nurse Contacted:     Sherolyn Trettin D ,PharmD Clinical Pharmacist  01/17/2018  10:20 PM

## 2018-01-17 NOTE — Progress Notes (Signed)
Pharmacy Antibiotic Note  Hailey Gibbs is a 72 y.o. female admitted on 01/17/2018 with sepsis.  Pharmacy has been consulted for vancomycin and Zosyn dosing.  Plan: DW 64 kg  Vd 45 L kei 0.052 hr-1  T1/2 13 hours Vancomycin 1 gram q 18 hours ordered with stacked dosing. Level before 5th dose. Goal trough 15-20  Zosyn 3.375 q 8 hours  EI ordered.  Height: 5\' 4"  (162.6 cm) Weight: 140 lb (63.5 kg) IBW/kg (Calculated) : 54.7  Temp (24hrs), Avg:98.5 F (36.9 C), Min:98.5 F (36.9 C), Max:98.5 F (36.9 C)  Recent Labs  Lab 01/17/18 2116  WBC 28.2*  CREATININE 0.60  LATICACIDVEN 1.0    Estimated Creatinine Clearance: 56.5 mL/min (by C-G formula based on SCr of 0.6 mg/dL).    Allergies  Allergen Reactions  . Codeine Anxiety         Antimicrobials this admission: Vancomycin, Zosyn 2/8  >>    >>   Dose adjustments this admission:   Microbiology results: 2/8 BCx: pending 2/8 UCx: pending  1/10 MRSA PCR: (-)      2/8 UA: LE(-) NO2 (+)  WBC 6-30 2/8 CXR: LLL residual consolidation  Thank you for allowing pharmacy to be a part of this patient's care.  Meeghan Skipper S 01/17/2018 11:59 PM

## 2018-01-17 NOTE — ED Provider Notes (Signed)
[facility] Emergency Department Provider Note  ____________________________________________  Time seen: Approximately 9:08 PM  I have reviewed the triage vital signs and the nursing notes.   HISTORY  Chief Complaint Shortness of Breath    HPI Hailey Gibbs is a 72 y.o. female w/ a hx of COPD on 2L [place] at baseline, ongoing tobacco abuse, discharged 1/14 from the hospital for LLL pna w/ hypoxia and COPD exacerbation, presenting w/ SOB.  The pt reports that several days ago, she began to feel more sob and tired.  She has had a chronic unchanged "wet cough" but otherwise no new congestion or rhinorrhea, sore throat or ear pain, fevers or chills.  2 days ago, she was initiated on Z-Pak by her PCP, but her symptoms have continued to get worse.  Today, the patient also had significant nausea without vomiting she denies any lower semi-swelling or calf pain.  Upon EMS arrival, the patient was satting in the high 80s on her 2 L baseline nasal cannula, and at 6 L, her O2 sats went into the low 90s  Past Medical History:  Diagnosis Date  . Anxiety   . Carotid artery occlusion   . COPD (chronic obstructive pulmonary disease) (HCC)   . Depression   . GERD (gastroesophageal reflux disease)   . Hyperlipidemia   . Hypertension   . Seasonal allergies   . Shortness of breath dyspnea     Patient Active Problem List   Diagnosis Date Noted  . Sepsis (HCC) 11/07/2016  . Chronic obstructive pulmonary disease with hypoxia (HCC) 10/26/2016  . Abnormal chest sounds 08/29/2015  . Obstructive chronic bronchitis with exacerbation (HCC) 08/29/2015  . Anxiety and depression 08/29/2015  . BP (high blood pressure) 08/29/2015  . Carotid stenosis 06/23/2015  . Pre-operative cardiovascular examination 05/30/2015  . Tobacco use 05/30/2015  . Hyperlipidemia     Past Surgical History:  Procedure Laterality Date  . ENDARTERECTOMY Left 06/23/2015   Procedure: ENDARTERECTOMY CAROTID;   Surgeon: Annice Needy, MD;  Location: [facility] ORS;  Service: Vascular;  Laterality: Left;  . TUBAL LIGATION    . VAGINAL HYSTERECTOMY      Current Outpatient Rx  . Order #: [id number] Class: Historical Med  . Order #: [id number] Class: Normal  . Order #: [id number] Class: Normal  . Order #: [id number] Class: Historical Med  . Order #: [id number] Class: Normal  . Order #: [id number] Class: Normal  . Order #: [id number] Class: Historical Med  . Order #: [id number] Class: Historical Med  . Order #: [id number] Class: Historical Med  . Order #: [id number] Class: Historical Med  . Order #: [id number] Class: Historical Med  . Order #: [id number] Class: Normal  . Order #: [id number] Class: Normal  . Order #: [id number] Class: Normal  . Order #: [id number] Class: Historical Med    Allergies Codeine  Family History  Problem Relation Age of Onset  . Heart disease Father 12       CABG   . Hyperlipidemia Father   . Pneumonia Father   . Dementia Mother   . COPD Mother   . Diabetes Sister   . Parkinson's disease Brother   . Post-traumatic stress disorder Son     Social History Social History   Tobacco Use  . Smoking status: Current Every Day Smoker    Packs/day: 0.50    Years: 45.00    Pack years: 22.50    Types: Cigarettes  . Smokeless tobacco: Never Used  . Tobacco comment: pt uses nictine patches occasioanlly   Substance Use Topics  .  Alcohol use: No    Alcohol/week: 0.0 oz  . Drug use: No    Review of Systems Constitutional: No fever/chills.  No body aches. Eyes: No visual changes. ENT: No sore throat. No congestion or rhinorrhea. Cardiovascular: Denies chest pain. Denies palpitations. Respiratory: Positive for increased work of breathing and shortness of breath.  Positive chronic unchanged cough. Gastrointestinal: No abdominal pain.  + nausea, no vomiting.  No diarrhea.  No constipation. Genitourinary: Negative for dysuria. Musculoskeletal: Negative for back pain.  No lower extreme swelling or  calf pain  skin: Negative for rash. Neurological: Negative for headaches. No focal numbness, tingling or weakness.     ____________________________________________   PHYSICAL EXAM:  VITAL SIGNS: ED Triage Vitals  Enc Vitals Group     BP 01/17/18 2014 (!) 130/57     Pulse Rate 01/17/18 2014 (!) 121     Resp 01/17/18 2014 (!) 28     Temp 01/17/18 2014 98.5 F (36.9 C)     Temp Source 01/17/18 2014 Oral     SpO2 01/17/18 2014 91 %     Weight 01/17/18 2016 140 lb (63.5 kg)     Height 01/17/18 2016 5\' 4"  (1.626 m)     Head Circumference --      Peak Flow --      Pain Score --      Pain Loc --      Pain Edu? --      Excl. in GC? --     Constitutional: The patient is alert, oriented, and answering questions appropriately.  She is in respiratory distress but mentating normally. Eyes: Conjunctivae are normal.  EOMI. No scleral icterus.  No eye discharge. Head: Atraumatic. Nose: No congestion/rhinnorhea. Mouth/Throat: Mucous membranes are moist.  Neck: No stridor.  Supple.  Positive JVD.  No meningismus. Cardiovascular: Fast rate, regular rhythm. No murmurs, rubs or gallops.  Respiratory: The patient has significant tachypnea with accessory muscle use and retractions.  Her O2 sats are between 88 and 92% on 6 L nasal cannula.  She has rales in the bases on the left side.  No wheezes or rhonchi.  She is only able to speak in 2-3 word sentences. Gastrointestinal: Soft, nontender and nondistended.  No guarding or rebound.  No peritoneal signs. Musculoskeletal: No LE edema. No ttp in the calves or palpable cords.  Negative Homan's sign. Neurologic:  A&Ox3.  Speech is clear.  Face and smile are symmetric.  EOMI.  Moves all extremities well. Skin:  Skin is warm, dry and intact. No rash noted. Psychiatric: Mood and affect are normal. Speech and behavior are normal.  Normal judgement  ____________________________________________   LABS (all labs ordered are listed, but only abnormal  results are displayed)  Labs Reviewed  COMPREHENSIVE METABOLIC PANEL - Abnormal; Notable for the following components:      Result Value   Potassium 3.4 (*)    Chloride 95 (*)    Glucose, Bld 143 (*)    All other components within normal limits  CBC WITH DIFFERENTIAL/PLATELET - Abnormal; Notable for the following components:   WBC 28.2 (*)    Hemoglobin 11.6 (*)    MCHC 31.9 (*)    Neutro Abs 26.8 (*)    Lymphs Abs 0.5 (*)    All other components within normal limits  URINALYSIS, COMPLETE (UACMP) WITH MICROSCOPIC - Abnormal; Notable for the following components:   Color, Urine AMBER (*)    APPearance HAZY (*)    Ketones, ur 5 (*)  Protein, ur 30 (*)    Nitrite POSITIVE (*)    Bacteria, UA MANY (*)    All other components within normal limits  BLOOD GAS, VENOUS - Abnormal; Notable for the following components:   pCO2, Ven 62 (*)    Bicarbonate 35.0 (*)    Acid-Base Excess 7.7 (*)    All other components within normal limits  BRAIN NATRIURETIC PEPTIDE - Abnormal; Notable for the following components:   B Natriuretic Peptide 109.0 (*)    All other components within normal limits  CULTURE, BLOOD (ROUTINE X 2)  CULTURE, BLOOD (ROUTINE X 2)  URINE CULTURE  LACTIC ACID, PLASMA  PROTIME-INR  INFLUENZA PANEL BY PCR (TYPE A & B)  TROPONIN I  LACTIC ACID, PLASMA   ____________________________________________  EKG  ED ECG REPORT I, Rockne MenghiniNorman, Anne-Caroline, the attending physician, personally viewed and interpreted this ECG.   Date: 01/17/2018  EKG Time: 2014  Rate: 121  Rhythm: sinus tachycardia  Axis: normal  Intervals:none  ST&T Change: No STEMI  ____________________________________________  RADIOLOGY  Dg Chest 2 View  Result Date: 01/17/2018 CLINICAL DATA:  Shortness of breath EXAM: CHEST  2 VIEW COMPARISON:  Chest radiograph 01/14/2018 and 12/18/2017 Chest CT 12/19/2017 FINDINGS: Unchanged cardiomediastinal contours. Small amount of residual consolidation at the  left lung base is unchanged. Hyperinflated lungs and diffuse interstitial prominence. Calcific aortic atherosclerosis. No pneumothorax or sizable pleural effusion. IMPRESSION: 1. Small amount of residual consolidation in the left lower lobe, unchanged. 2. Aortic Atherosclerosis (ICD10-I70.0) and Emphysema (ICD10-J43.9). Electronically Signed   By: Deatra RobinsonKevin  Herman M.D.   On: 01/17/2018 20:47    ____________________________________________   PROCEDURES  Procedure(s) performed: None  Procedures  Critical Care performed: Yes, see critical care note(s) ____________________________________________   INITIAL IMPRESSION / ASSESSMENT AND PLAN / ED COURSE  Pertinent labs & imaging results that were available during my care of the patient were reviewed by me and considered in my medical decision making (see chart for details).  72 y.o. tobacco abuse, recent hospitalization for pneumonia and COPD exacerbation presenting with worsening respiratory symptoms.  Overall, the patient is hypoxic, and respiratory discomfort and distress, and tachycardic.  She is afebrile here.  Her chest x-ray shows a stable left lower lobe infiltrate.  I will plan to place the patient on BiPAP, and initiate IV antibiotics, and admit the patient for continued evaluation and treatment.   CRITICAL CARE Performed by: Rockne MenghiniNorman, Anne-Caroline   Total critical care time: 40 minutes  Critical care time was exclusive of separately billable procedures and treating other patients.  Critical care was necessary to treat or prevent imminent or life-threatening deterioration.  Critical care was time spent personally by me on the following activities: development of treatment plan with patient and/or surrogate as well as nursing, discussions with consultants, evaluation of patient's response to treatment, examination of patient, obtaining history from patient or surrogate, ordering and performing treatments and interventions, ordering  and review of laboratory studies, ordering and review of radiographic studies, pulse oximetry and re-evaluation of patient's condition.   ____________________________________________  FINAL CLINICAL IMPRESSION(S) / ED DIAGNOSES  Final diagnoses:  Respiratory distress  COPD exacerbation (HCC)  Hospital-acquired pneumonia         NEW MEDICATIONS STARTED DURING THIS VISIT:  New Prescriptions   No medications on file      Rockne MenghiniNorman, Anne-Caroline, MD 01/17/18 2306

## 2018-01-17 NOTE — H&P (Signed)
[facility] [place] [facility]   PATIENT NAME: Hailey Gibbs    MR#:  [id number]  DATE OF BIRTH:  [birth year]  DATE OF ADMISSION:  01/17/2018  PRIMARY CARE PHYSICIAN: Chrismon, Jodell Cipro, PA   REQUESTING/REFERRING PHYSICIAN: Sharma Covert, MD  CHIEF COMPLAINT:   Chief Complaint  Patient presents with  . Shortness of Breath    HISTORY OF PRESENT ILLNESS:  Hailey Gibbs  is a 72 y.o. female who presents with shortness of breath and malaise.  Patient was recently treated for pneumonia.  Here in the ED she was found to be in respiratory distress, workup respiratory wise is largely consistent with COPD.  Patient required BiPAP for her work of breathing.  She was also found to meet sepsis criteria, but does not appear to have a pulmonary infection.  She does have a UTI.  Hospitalist were called for admission  PAST MEDICAL HISTORY:   Past Medical History:  Diagnosis Date  . Anxiety   . Carotid artery occlusion   . COPD (chronic obstructive pulmonary disease) (HCC)   . Depression   . GERD (gastroesophageal reflux disease)   . Hyperlipidemia   . Hypertension   . Seasonal allergies   . Shortness of breath dyspnea     PAST SURGICAL HISTORY:   Past Surgical History:  Procedure Laterality Date  . ENDARTERECTOMY Left 06/23/2015   Procedure: ENDARTERECTOMY CAROTID;  Surgeon: Annice Needy, MD;  Location: [facility] ORS;  Service: Vascular;  Laterality: Left;  . TUBAL LIGATION    . VAGINAL HYSTERECTOMY      SOCIAL HISTORY:   Social History   Tobacco Use  . Smoking status: Current Every Day Smoker    Packs/day: 0.50    Years: 45.00    Pack years: 22.50    Types: Cigarettes  . Smokeless tobacco: Never Used  . Tobacco comment: pt uses nictine patches occasioanlly   Substance Use Topics  . Alcohol use: No    Alcohol/week: 0.0 oz    FAMILY HISTORY:   Family History  Problem Relation Age of Onset  . Heart disease Father 47       CABG   . Hyperlipidemia  Father   . Pneumonia Father   . Dementia Mother   . COPD Mother   . Diabetes Sister   . Parkinson's disease Brother   . Post-traumatic stress disorder Son     DRUG ALLERGIES:   Allergies  Allergen Reactions  . Codeine Anxiety         MEDICATIONS AT HOME:   Prior to Admission medications   Medication Sig Start Date End Date Taking? Authorizing Provider  acetaminophen (TYLENOL) 500 MG tablet Take 1,000 mg by mouth every 6 (six) hours as needed for mild pain or headache.     [provider]  albuterol (VENTOLIN HFA) 108 (90 Base) MCG/ACT inhaler INHALE 1-2 PUFFS 4 TIMES A DAY AS NEEDED FOR WHEEZING 12/23/17   Chrismon, Maurine Minister E, PA  amLODipine (NORVASC) 10 MG tablet TAKE 1 TABLET (10 MG TOTAL) BY MOUTH DAILY. 06/07/17   Chrismon, Jodell Cipro, PA  aspirin 81 MG chewable tablet Chew 81 mg by mouth at bedtime.     [provider]  atorvastatin (LIPITOR) 10 MG tablet Take 1 tablet (10 mg total) by mouth daily. 06/07/17   Chrismon, Jodell Cipro, PA  azithromycin (ZITHROMAX) 250 MG tablet Take 2 tablets by mouth today then one tablet daily for 4 days. 01/14/18   Chrismon, Jodell Cipro, PA  B Complex-C-Folic Acid (HM SUPER VITAMIN B COMPLEX/C PO) Take 1 tablet by mouth daily.     [provider]  calcium carbonate (OSCAL) 1500 (600 Ca) MG TABS tablet Take 600 mg of elemental calcium by mouth daily with breakfast.    [provider]  cetirizine (ZYRTEC) 10 MG tablet Take 10 mg by mouth at bedtime.     [provider]  Cholecalciferol (VITAMIN D3) 2000 units TABS Take by mouth daily.    [provider]  Multiple Vitamin (MULTIVITAMIN) tablet Take 1 tablet by mouth at bedtime.     [provider]  sertraline (ZOLOFT) 100 MG tablet 1 (ONE) TABLET, ORAL, AT BEDTIME DAILY BY MOUTH 06/13/17   Chrismon, Jodell CiproDennis E, PA  SPIRIVA HANDIHALER 18 MCG inhalation capsule INHALE 1 CAPSULE ONCE A DAY 01/10/17   Chrismon, Maurine Ministerennis E, PA  SYMBICORT 160-4.5 MCG/ACT inhaler  INHALE 2 PUFFS INTO THE LUNGS 2 (TWO) TIMES DAILY. 11/19/17   Chrismon, Jodell Ciproennis E, PA  vitamin E 400 UNIT capsule Take 400 Units by mouth daily.    [provider]    REVIEW OF SYSTEMS:  Review of Systems  Constitutional: Positive for malaise/fatigue. Negative for chills, fever and weight loss.  HENT: Negative for ear pain, hearing loss and tinnitus.   Eyes: Negative for blurred vision, double vision, pain and redness.  Respiratory: Positive for shortness of breath. Negative for cough and hemoptysis.   Cardiovascular: Negative for chest pain, palpitations, orthopnea and leg swelling.  Gastrointestinal: Negative for abdominal pain, constipation, diarrhea, nausea and vomiting.  Genitourinary: Positive for dysuria. Negative for frequency and hematuria.  Musculoskeletal: Negative for back pain, joint pain and neck pain.  Skin:       No acne, rash, or lesions  Neurological: Negative for dizziness, tremors, focal weakness and weakness.  Endo/Heme/Allergies: Negative for polydipsia. Does not bruise/bleed easily.  Psychiatric/Behavioral: Negative for depression. The patient is not nervous/anxious and does not have insomnia.      VITAL SIGNS:   Vitals:   01/17/18 2200 01/17/18 2230 01/17/18 2300 01/17/18 2330  BP: (!) 142/66 132/63 (!) 127/59 (!) 116/58  Pulse: (!) 115 (!) 116 (!) 112 (!) 110  Resp: (!) 22 (!) 33 (!) 27 (!) 33  Temp:      TempSrc:      SpO2: 99% 96% 94% 92%  Weight:      Height:       Wt Readings from Last 3 Encounters:  01/17/18 63.5 kg (140 lb)  01/14/18 63.5 kg (140 lb)  12/31/17 63.5 kg (140 lb)    PHYSICAL EXAMINATION:  Physical Exam  Vitals reviewed. Constitutional: She is oriented to person, place, and time. She appears well-developed and well-nourished. No distress.  HENT:  Head: Normocephalic and atraumatic.  Mouth/Throat: Oropharynx is clear and moist.  Eyes: Conjunctivae and EOM are normal. Pupils are equal, round, and reactive to light. No  scleral icterus.  Neck: Normal range of motion. Neck supple. No JVD present. No thyromegaly present.  Cardiovascular: Regular rhythm and intact distal pulses. Exam reveals no gallop and no friction rub.  No murmur heard. Tachycardic  Respiratory: She is in respiratory distress (On BiPAP). She has wheezes. She has no rales.  GI: Soft. Bowel sounds are normal. She exhibits no distension. There is no tenderness.  Musculoskeletal: Normal range of motion. She exhibits no edema.  No arthritis, no gout  Lymphadenopathy:    She has no cervical adenopathy.  Neurological: She is alert and oriented to person,  place, and time. No cranial nerve deficit.  No dysarthria, no aphasia  Skin: Skin is warm and dry. No rash noted. No erythema.  Psychiatric: She has a normal mood and affect. Her behavior is normal. Judgment and thought content normal.    LABORATORY PANEL:   CBC Recent Labs  Lab 01/17/18 2116  WBC 28.2*  HGB 11.6*  HCT 36.3  PLT 324   ------------------------------------------------------------------------------------------------------------------  Chemistries  Recent Labs  Lab 01/17/18 2116  NA 136  K 3.4*  CL 95*  CO2 29  GLUCOSE 143*  BUN 13  CREATININE 0.60  CALCIUM 9.1  AST 20  ALT 15  ALKPHOS 99  BILITOT 0.6   ------------------------------------------------------------------------------------------------------------------  Cardiac Enzymes Recent Labs  Lab 01/17/18 2116  TROPONINI <0.03   ------------------------------------------------------------------------------------------------------------------  RADIOLOGY:  Dg Chest 2 View  Result Date: 01/17/2018 CLINICAL DATA:  Shortness of breath EXAM: CHEST  2 VIEW COMPARISON:  Chest radiograph 01/14/2018 and 12/18/2017 Chest CT 12/19/2017 FINDINGS: Unchanged cardiomediastinal contours. Small amount of residual consolidation at the left lung base is unchanged. Hyperinflated lungs and diffuse interstitial  prominence. Calcific aortic atherosclerosis. No pneumothorax or sizable pleural effusion. IMPRESSION: 1. Small amount of residual consolidation in the left lower lobe, unchanged. 2. Aortic Atherosclerosis (ICD10-I70.0) and Emphysema (ICD10-J43.9). Electronically Signed   By: Deatra Robinson M.D.   On: 01/17/2018 20:47    EKG:   Orders placed or performed during the hospital encounter of 01/17/18  . EKG 12-Lead  . EKG 12-Lead  . ED EKG 12-Lead  . ED EKG 12-Lead    IMPRESSION AND PLAN:  Principal Problem:   Sepsis (HCC) -IV antibiotics, blood pressure stable, lactic acid normal, cultures sent Active Problems:   UTI (urinary tract infection) -IV antibiotics and cultures as above   BP (high blood pressure) -continue home meds   Chronic obstructive pulmonary disease with hypoxia (HCC) -patient would probably benefit from steroid, unclear if her current COPD flareup is due to sepsis, or perhaps sepsis in conjunction with her recent pneumonia continue home dose inhalers, other PRN supportive treatment here, wean BiPAP as possible   Hyperlipidemia -home meds   Anxiety and depression -home meds  All the records are reviewed and case discussed with ED provider. Management plans discussed with the patient and/or family.  DVT PROPHYLAXIS: SubQ lovenox  GI PROPHYLAXIS: None  ADMISSION STATUS: Inpatient  CODE STATUS: Full Code Status History    Date Active Date Inactive Code Status Order ID Comments User Context   12/18/2017 14:58 12/23/2017 17:59 Full Code [id number]  Ramonita Lab, MD Inpatient   11/07/2016 14:27 11/10/2016 15:35 Full Code [id number]  Enedina Finner, MD Inpatient   06/23/2015 17:59 06/24/2015 20:36 Full Code [id number]  Annice Needy, MD Inpatient      TOTAL TIME TAKING CARE OF THIS PATIENT: 45 minutes.   Aleayah Chico FIELDING 01/17/2018, 11:51 PM  [employer]  [id number]  CC: Primary care physician; Tamsen Roers, PA  Note:  This document was  prepared using Dragon voice recognition software and may include unintentional dictation errors.

## 2018-01-17 NOTE — ED Triage Notes (Signed)
Pt to ED via EMS from home c/o SOB.  Recently treated for pneumonia.  Presents with chest congestion, productive cough, audible wheezing.  EMS administered 1 duoneb, 1 albuterol, 125mg  solumedrol.  Wears 2L [place] at home, was 88% on scene, increased to 6L [place] upon arrival with sats of 90%.  Denies pain, states mild nausea.

## 2018-01-18 DIAGNOSIS — J441 Chronic obstructive pulmonary disease with (acute) exacerbation: Secondary | ICD-10-CM

## 2018-01-18 LAB — BASIC METABOLIC PANEL
Anion gap: 10 (ref 5–15)
BUN: 10 mg/dL (ref 6–20)
CO2: 28 mmol/L (ref 22–32)
Calcium: 8.6 mg/dL — ABNORMAL LOW (ref 8.9–10.3)
Chloride: 102 mmol/L (ref 101–111)
Creatinine, Ser: 0.58 mg/dL (ref 0.44–1.00)
Glucose, Bld: 151 mg/dL — ABNORMAL HIGH (ref 65–99)
Potassium: 3.7 mmol/L (ref 3.5–5.1)
SODIUM: 140 mmol/L (ref 135–145)

## 2018-01-18 LAB — CBC
HCT: 33.4 % — ABNORMAL LOW (ref 35.0–47.0)
Hemoglobin: 10.6 g/dL — ABNORMAL LOW (ref 12.0–16.0)
MCH: 29.6 pg (ref 26.0–34.0)
MCHC: 31.6 g/dL — ABNORMAL LOW (ref 32.0–36.0)
MCV: 93.6 fL (ref 80.0–100.0)
PLATELETS: 295 10*3/uL (ref 150–440)
RBC: 3.57 MIL/uL — AB (ref 3.80–5.20)
RDW: 13.8 % (ref 11.5–14.5)
WBC: 29.1 10*3/uL — AB (ref 3.6–11.0)

## 2018-01-18 LAB — MRSA PCR SCREENING: MRSA BY PCR: NEGATIVE

## 2018-01-18 LAB — PHOSPHORUS: PHOSPHORUS: 3.2 mg/dL (ref 2.5–4.6)

## 2018-01-18 LAB — GLUCOSE, CAPILLARY: GLUCOSE-CAPILLARY: 150 mg/dL — AB (ref 65–99)

## 2018-01-18 LAB — MAGNESIUM: MAGNESIUM: 1.7 mg/dL (ref 1.7–2.4)

## 2018-01-18 MED ORDER — ONDANSETRON HCL 4 MG PO TABS: 4.0000 mg | ORAL_TABLET | Freq: Four times a day (QID) | ORAL | Status: DC | PRN

## 2018-01-18 MED ORDER — ENOXAPARIN SODIUM 40 MG/0.4ML ~~LOC~~ SOLN
40.0000 mg | SUBCUTANEOUS | Status: DC
  Administered 2018-01-18 – 2018-01-20 (×3): 40 mg via SUBCUTANEOUS
  Filled 2018-01-18 (×3): qty 0.4

## 2018-01-18 MED ORDER — METHYLPREDNISOLONE SODIUM SUCC 125 MG IJ SOLR
60.0000 mg | Freq: Four times a day (QID) | INTRAMUSCULAR | Status: DC
  Administered 2018-01-18 – 2018-01-20 (×8): 60 mg via INTRAVENOUS
  Filled 2018-01-18 (×8): qty 2

## 2018-01-18 MED ORDER — IPRATROPIUM-ALBUTEROL 0.5-2.5 (3) MG/3ML IN SOLN
3.0000 mL | RESPIRATORY_TRACT | Status: DC | PRN
  Administered 2018-01-18 – 2018-01-20 (×4): 3 mL via RESPIRATORY_TRACT
  Filled 2018-01-18 (×5): qty 3

## 2018-01-18 MED ORDER — ATORVASTATIN CALCIUM 10 MG PO TABS
10.0000 mg | ORAL_TABLET | Freq: Every day | ORAL | Status: DC
  Administered 2018-01-18 – 2018-01-21 (×4): 10 mg via ORAL
  Filled 2018-01-18 (×4): qty 1

## 2018-01-18 MED ORDER — ACETAMINOPHEN 325 MG PO TABS
650.0000 mg | ORAL_TABLET | Freq: Four times a day (QID) | ORAL | Status: DC | PRN
  Administered 2018-01-18 – 2018-01-20 (×3): 650 mg via ORAL
  Filled 2018-01-18 (×2): qty 2

## 2018-01-18 MED ORDER — ORAL CARE MOUTH RINSE
15.0000 mL | Freq: Two times a day (BID) | OROMUCOSAL | Status: DC
  Administered 2018-01-19 (×2): 15 mL via OROMUCOSAL

## 2018-01-18 MED ORDER — MOMETASONE FURO-FORMOTEROL FUM 200-5 MCG/ACT IN AERO
2.0000 | INHALATION_SPRAY | Freq: Two times a day (BID) | RESPIRATORY_TRACT | Status: DC
  Administered 2018-01-18 – 2018-01-21 (×7): 2 via RESPIRATORY_TRACT
  Filled 2018-01-18: qty 8.8

## 2018-01-18 MED ORDER — CHLORHEXIDINE GLUCONATE 0.12 % MT SOLN
15.0000 mL | Freq: Two times a day (BID) | OROMUCOSAL | Status: DC
  Administered 2018-01-18 – 2018-01-20 (×4): 15 mL via OROMUCOSAL
  Filled 2018-01-18 (×6): qty 15

## 2018-01-18 MED ORDER — ACETAMINOPHEN 650 MG RE SUPP: 650.0000 mg | Freq: Four times a day (QID) | RECTAL | Status: DC | PRN

## 2018-01-18 MED ORDER — TIOTROPIUM BROMIDE MONOHYDRATE 18 MCG IN CAPS
18.0000 ug | ORAL_CAPSULE | Freq: Every day | RESPIRATORY_TRACT | Status: DC
  Administered 2018-01-18 – 2018-01-21 (×4): 18 ug via RESPIRATORY_TRACT
  Filled 2018-01-18: qty 5

## 2018-01-18 MED ORDER — ONDANSETRON HCL 4 MG/2ML IJ SOLN: 4.0000 mg | Freq: Four times a day (QID) | INTRAMUSCULAR | Status: DC | PRN

## 2018-01-18 MED ORDER — AMLODIPINE BESYLATE 10 MG PO TABS
10.0000 mg | ORAL_TABLET | Freq: Every day | ORAL | Status: DC
  Administered 2018-01-18 – 2018-01-21 (×4): 10 mg via ORAL
  Filled 2018-01-18 (×4): qty 1

## 2018-01-18 MED ORDER — ASPIRIN 81 MG PO CHEW
81.0000 mg | CHEWABLE_TABLET | Freq: Every day | ORAL | Status: DC
  Administered 2018-01-18 – 2018-01-20 (×3): 81 mg via ORAL
  Filled 2018-01-18 (×3): qty 1

## 2018-01-18 MED ORDER — SERTRALINE HCL 50 MG PO TABS
100.0000 mg | ORAL_TABLET | Freq: Every day | ORAL | Status: DC
  Administered 2018-01-18 – 2018-01-20 (×3): 100 mg via ORAL
  Filled 2018-01-18 (×3): qty 2

## 2018-01-18 MED ORDER — SODIUM CHLORIDE 0.9 % IV SOLN
INTRAVENOUS | Status: DC
  Administered 2018-01-18: 01:00:00 via INTRAVENOUS

## 2018-01-18 NOTE — Progress Notes (Signed)
[facility] - [place] at [facility]   PATIENT NAME: Hailey Gibbs    MR#:  [id number]  DATE OF BIRTH:  1946-05-12  SUBJECTIVE:  CHIEF COMPLAINT:   Chief Complaint  Patient presents with  . Shortness of Breath  In discussion with intensivist, patient successfully weaned off BiPAP this morning, possible transfer to the floor later today versus tomorrow  REVIEW OF SYSTEMS:  CONSTITUTIONAL: No fever, fatigue or weakness.  EYES: No blurred or double vision.  EARS, NOSE, AND THROAT: No tinnitus or ear pain.  RESPIRATORY: No cough, shortness of breath, wheezing or hemoptysis.  CARDIOVASCULAR: No chest pain, orthopnea, edema.  GASTROINTESTINAL: No nausea, vomiting, diarrhea or abdominal pain.  GENITOURINARY: No dysuria, hematuria.  ENDOCRINE: No polyuria, nocturia,  HEMATOLOGY: No anemia, easy bruising or bleeding SKIN: No rash or lesion. MUSCULOSKELETAL: No joint pain or arthritis.   NEUROLOGIC: No tingling, numbness, weakness.  PSYCHIATRY: No anxiety or depression.   ROS  DRUG ALLERGIES:   Allergies  Allergen Reactions  . Codeine Anxiety         VITALS:  Blood pressure (!) 159/69, pulse (!) 118, temperature 98.5 F (36.9 C), temperature source Oral, resp. rate (!) 25, height 5\' 5"  (1.651 m), weight 65.3 kg (143 lb 15.4 oz), SpO2 91 %.  PHYSICAL EXAMINATION:  GENERAL:  72 y.o.-year-old patient lying in the bed with no acute distress.  EYES: Pupils equal, round, reactive to light and accommodation. No scleral icterus. Extraocular muscles intact.  HEENT: Head atraumatic, normocephalic. Oropharynx and nasopharynx clear.  NECK:  Supple, no jugular venous distention. No thyroid enlargement, no tenderness.  LUNGS: Normal breath sounds bilaterally, no wheezing, rales,rhonchi or crepitation. No use of accessory muscles of respiration.  CARDIOVASCULAR: S1, S2 normal. No murmurs, rubs, or gallops.  ABDOMEN: Soft, nontender, nondistended. Bowel sounds present. No  organomegaly or mass.  EXTREMITIES: No pedal edema, cyanosis, or clubbing.  NEUROLOGIC: Cranial nerves II through XII are intact. Muscle strength 5/5 in all extremities. Sensation intact. Gait not checked.  PSYCHIATRIC: The patient is alert and oriented x 3.  SKIN: No obvious rash, lesion, or ulcer.   Physical Exam LABORATORY PANEL:   CBC Recent Labs  Lab 01/18/18 0543  WBC 29.1*  HGB 10.6*  HCT 33.4*  PLT 295   ------------------------------------------------------------------------------------------------------------------  Chemistries  Recent Labs  Lab 01/17/18 2116 01/18/18 0543  NA 136 140  K 3.4* 3.7  CL 95* 102  CO2 29 28  GLUCOSE 143* 151*  BUN 13 10  CREATININE 0.60 0.58  CALCIUM 9.1 8.6*  MG  --  1.7  AST 20  --   ALT 15  --   ALKPHOS 99  --   BILITOT 0.6  --    ------------------------------------------------------------------------------------------------------------------  Cardiac Enzymes Recent Labs  Lab 01/17/18 2116  TROPONINI <0.03   ------------------------------------------------------------------------------------------------------------------  RADIOLOGY:  Dg Chest 2 View  Result Date: 01/17/2018 CLINICAL DATA:  Shortness of breath EXAM: CHEST  2 VIEW COMPARISON:  Chest radiograph 01/14/2018 and 12/18/2017 Chest CT 12/19/2017 FINDINGS: Unchanged cardiomediastinal contours. Small amount of residual consolidation at the left lung base is unchanged. Hyperinflated lungs and diffuse interstitial prominence. Calcific aortic atherosclerosis. No pneumothorax or sizable pleural effusion. IMPRESSION: 1. Small amount of residual consolidation in the left lower lobe, unchanged. 2. Aortic Atherosclerosis (ICD10-I70.0) and Emphysema (ICD10-J43.9). Electronically Signed   By: Deatra RobinsonKevin  Herman M.D.   On: 01/17/2018 20:47    ASSESSMENT AND PLAN:  1 acute hypoxic respiratory failure Secondary to sepsis and COPD exacerbation  Successfully weaned off CPAP this  morning, in discussion with intensivist, possible transfer to regular nursing floor bed later today or tomorrow, continue supplemental oxygen with weaning as tolerated  2 acute sepsis most likely secondary to acute urinary tract infection Continue empiric antibiotics, follow-up on cultures, IV fluids rehydration  3 acute on COPD exacerbation Resolving Continue empiric antibiotics, aggressive pulmonary toilet with bronchodilator therapy, supplemental oxygen with weaning as tolerated, successfully weaned off BiPAP this morning, possible transfer to floor today or tomorrow discussion with intensivist   4 chronic benign essential hypertension Stable on current regiment  5 chronic hyperlipidemia, unspecified Stable continue statin therapy  All the records are reviewed and case discussed with Care Management/Social Workerr. Management plans discussed with the patient, family and they are in agreement.  CODE STATUS: full  TOTAL TIME TAKING CARE OF THIS PATIENT: 35 minutes.     POSSIBLE D/C IN 1-3 DAYS, DEPENDING ON CLINICAL CONDITION.   Evelena Asa Cleven Jansma M.D on 01/18/2018   Between 7am to 6pm - Pager - [phone]  After 6pm go to [email or web address] - password [email or web address]  [facility]  Office  [phone]  CC: Primary care physician; Tamsen Roers, PA  Note: This dictation was prepared with Dragon dictation along with smaller phrase technology. Any transcriptional errors that result from this process are unintentional.

## 2018-01-18 NOTE — Progress Notes (Signed)
Alert and orientedx4.Painfree. Was transitioned off BIPAP this am to 5L/[place]. Refusing to wear BIPAP again. States" its horrible" "I cant do that again."  She is ShOB with exertion.At 1445 she felt she was getting worse. Work of breathing was worsening. She was place on HFNC 40% with relief of symptoms. UOP is adequate. Appetite good. VSS ST on monitor.  Run SVT while off and on bedpan.  Refuses purewick.

## 2018-01-18 NOTE — Progress Notes (Signed)
New admit from ED for bipap. Bipap off this AM, on nasal cannula at 6L. Complained to NP that she was off mask for thirty minutes, but vital sign monitor shows the timeframe of taking off mask and putting it back on. Two different nurses replaced bipap and patient kept pulling it off, until she was placed on 6L. Did not want to use the purwick external catheter and asked for a pad. Patient wearing diaper but [facility] With [facility]ap, had to explain that patient could only use the bedpan. Did have good urinary output.  Does have left lower lobe consolidation, started on ABX and had sepsis workup in ED. Has been pain free, continue to monitor.

## 2018-01-18 NOTE — Progress Notes (Signed)
Bipap on standby at this time. Pt is on 5l [place] and is alert and oriented at this time.

## 2018-01-18 NOTE — Progress Notes (Signed)
Pharmacy Antibiotic Note  Hailey Gibbs is a 72 y.o. female admitted on 01/17/2018 with sepsis/UTI.  Pharmacy has been consulted for vancomycin and Zosyn dosing. Patient recently treated for PNA 01/14/18 oupt. Hx Pseudomonas PNA  Plan: DW 64 kg  Vd 45 L kei 0.052 hr-1  T1/2 13 hours Vancomycin 1 gram q 18 hours ordered with stacked dosing. Level before 5th dose. Goal trough 15-20  Zosyn 3.375 q 8 hours  EI ordered.  Watch renal fxn while on zosyn/Vancomycin  Height: 5\' 5"  (165.1 cm) Weight: 143 lb 15.4 oz (65.3 kg) IBW/kg (Calculated) : 57  Temp (24hrs), Avg:98.4 F (36.9 C), Min:98 F (36.7 C), Max:98.5 F (36.9 C)  Recent Labs  Lab 01/17/18 2116 01/18/18 0543  WBC 28.2* 29.1*  CREATININE 0.60 0.58  LATICACIDVEN 1.0  --     Estimated Creatinine Clearance: 58.9 mL/min (by C-G formula based on SCr of 0.58 mg/dL).    Allergies  Allergen Reactions  . Codeine Anxiety         Antimicrobials this admission: Vancomycin, Zosyn 2/8  >>    >>   Dose adjustments this admission:   Microbiology results: 2/8 BCx: pending 2/8 UCx: pending  1/10 MRSA PCR: (-)      2/8 UA: LE(-) NO2 (+)  WBC 6-30 2/8 CXR: LLL residual consolidation  Thank you for allowing pharmacy to be a part of this patient's care.  Bradi Arbuthnot A 01/18/2018 1:31 PM

## 2018-01-18 NOTE — Consult Note (Signed)
PULMONARY / CRITICAL CARE MEDICINE   Name: Hailey Gibbs MRN: [id number] DOB: 1946-02-19    ADMISSION DATE:  01/17/2018   CONSULTATION DATE:  01/18/2018  REFERRING MD:  Dr. Anne Hahn  Reason: Acute respiratory failure with hypercarbia  HISTORY OF PRESENT ILLNESS:   This is a 72 year old female with a past medical history as indicated below presented to the ED with acute shortness of breath.  When EMS arrived, patient was hypoxic with an SPO2 of 88% on 2 L nasal cannula which is her home dose of oxygen and hence was placed on 6 L nasal cannula, given 1 dose of DuoNeb and 125 mg of Solu-Medrol.  Upon arrival in the ED her SPO2 had improved to 90% but she was still severely tachypneic hence she was placed on BiPAP.  Her ED workup showed a UTI and sepsis secondary to UTI.  She is being admitted to the ICU for further management stop Of note, patient was recently treated for pneumonia PAST MEDICAL HISTORY :  She  has a past medical history of Anxiety, Carotid artery occlusion, COPD (chronic obstructive pulmonary disease) (HCC), Depression, GERD (gastroesophageal reflux disease), Hyperlipidemia, Hypertension, Seasonal allergies, and Shortness of breath dyspnea.  PAST SURGICAL HISTORY: She  has a past surgical history that includes Vaginal hysterectomy; Tubal ligation; and Endarterectomy (Left, 06/23/2015).  Allergies  Allergen Reactions  . Codeine Anxiety         No current facility-administered medications on file prior to encounter.    Current Outpatient Medications on File Prior to Encounter  Medication Sig  . acetaminophen (TYLENOL) 500 MG tablet Take 1,000 mg by mouth every 6 (six) hours as needed for mild pain or headache.   . albuterol (VENTOLIN HFA) 108 (90 Base) MCG/ACT inhaler INHALE 1-2 PUFFS 4 TIMES A DAY AS NEEDED FOR WHEEZING  . amLODipine (NORVASC) 10 MG tablet TAKE 1 TABLET (10 MG TOTAL) BY MOUTH DAILY.  Marland Kitchen aspirin 81 MG chewable tablet Chew 81 mg by mouth at bedtime.   Marland Kitchen  atorvastatin (LIPITOR) 10 MG tablet Take 1 tablet (10 mg total) by mouth daily.  Marland Kitchen azithromycin (ZITHROMAX) 250 MG tablet Take 2 tablets by mouth today then one tablet daily for 4 days.  . B Complex-C-Folic Acid (HM SUPER VITAMIN B COMPLEX/C PO) Take 1 tablet by mouth daily.   . calcium carbonate (OSCAL) 1500 (600 Ca) MG TABS tablet Take 600 mg of elemental calcium by mouth daily with breakfast.  . cetirizine (ZYRTEC) 10 MG tablet Take 10 mg by mouth at bedtime.   . Cholecalciferol (VITAMIN D3) 2000 units TABS Take 2,000 Units by mouth daily.   . Multiple Vitamin (MULTIVITAMIN) tablet Take 1 tablet by mouth at bedtime.   . sertraline (ZOLOFT) 100 MG tablet 1 (ONE) TABLET, ORAL, AT BEDTIME DAILY BY MOUTH  . SPIRIVA HANDIHALER 18 MCG inhalation capsule INHALE 1 CAPSULE ONCE A DAY  . SYMBICORT 160-4.5 MCG/ACT inhaler INHALE 2 PUFFS INTO THE LUNGS 2 (TWO) TIMES DAILY.  . vitamin E 400 UNIT capsule Take 400 Units by mouth daily.    FAMILY HISTORY:  Her indicated that her mother is deceased. She indicated that her father is deceased. She indicated that her sister is alive. She indicated that her brother is alive. She indicated that only one of her two sons is alive.   SOCIAL HISTORY: She  reports that she has been smoking cigarettes.  She has a 22.50 pack-year smoking history. she has never used smokeless tobacco. She reports that she does  not drink alcohol or use drugs.  REVIEW OF SYSTEMS:   Unable to obtain as patient is on continuous BiPAP  SUBJECTIVE:   VITAL SIGNS: BP 129/71   Pulse (!) 111   Temp 98.5 F (36.9 C) (Oral)   Resp (!) 33   Ht 5\' 4"  (1.626 m)   Wt 63.5 kg (140 lb)   SpO2 96%   BMI 24.03 kg/m   HEMODYNAMICS:    VENTILATOR SETTINGS: FiO2 (%):  [40 %] 40 %  INTAKE / OUTPUT: No intake/output data recorded.  PHYSICAL EXAMINATION: General: Moderate respiratory distress Neuro: Alert and oriented x4 no focal deficits HEENT: PERRLA, trachea midline, no  JVD Cardiovascular: Apical pulse mildly tachycardic, S1-S2, no murmur regurg or gallop Lungs: Increased work of breathing, bilateral breath sounds with mild expiratory wheezes, diffuse rhonchi in anterior lung fields Abdomen: Nondistended, normal bowel sounds Musculoskeletal: Deformities, positive range of motion  skin: Warm and dry  LABS:  BMET Recent Labs  Lab 01/17/18 2116  NA 136  K 3.4*  CL 95*  CO2 29  BUN 13  CREATININE 0.60  GLUCOSE 143*    Electrolytes Recent Labs  Lab 01/17/18 2116  CALCIUM 9.1    CBC Recent Labs  Lab 01/17/18 2116  WBC 28.2*  HGB 11.6*  HCT 36.3  PLT 324    Coag's Recent Labs  Lab 01/17/18 2116  INR 0.92    Sepsis Markers Recent Labs  Lab 01/17/18 2116  LATICACIDVEN 1.0    ABG No results for input(s): PHART, PCO2ART, PO2ART in the last 168 hours.  Liver Enzymes Recent Labs  Lab 01/17/18 2116  AST 20  ALT 15  ALKPHOS 99  BILITOT 0.6  ALBUMIN 3.8    Cardiac Enzymes Recent Labs  Lab 01/17/18 2116  TROPONINI <0.03    Glucose No results for input(s): GLUCAP in the last 168 hours.  Imaging Dg Chest 2 View  Result Date: 01/17/2018 CLINICAL DATA:  Shortness of breath EXAM: CHEST  2 VIEW COMPARISON:  Chest radiograph 01/14/2018 and 12/18/2017 Chest CT 12/19/2017 FINDINGS: Unchanged cardiomediastinal contours. Small amount of residual consolidation at the left lung base is unchanged. Hyperinflated lungs and diffuse interstitial prominence. Calcific aortic atherosclerosis. No pneumothorax or sizable pleural effusion. IMPRESSION: 1. Small amount of residual consolidation in the left lower lobe, unchanged. 2. Aortic Atherosclerosis (ICD10-I70.0) and Emphysema (ICD10-J43.9). Electronically Signed   By: Deatra RobinsonKevin  Herman M.D.   On: 01/17/2018 20:47    STUDIES:  None  CULTURES: Blood cultures x2 Urine culture  ANTIBIOTICS: Vancomycin Zosyn  SIGNIFICANT EVENTS: 01/17/2018: Admitted  LINES/TUBES: Peripheral  IVs  DISCUSSION: 72 year old female with a history of COPD on home O2 admitted with acute hypoxic respiratory failure secondary to sepsis and UTI  ASSESSMENT  Sepsis secondary to UTI Urinary tract infection Acute hypoxic respiratory failure Acute COPD exacerbation History of hypertension, hyperlipidemia, anxiety and depression  PLAN Continuous BiPAP and titrate to nasal cannula as tolerated Antibiotics as above Hemodynamic monitoring per ICU protocol Follow-up cultures Trend pro-calcitonin and adjust antibiotics Resume all home medications GI and DVT prophylaxis FAMILY  - Updates: No family at bedside.  Will update when available  - Inter-disciplinary family meet or Palliative Care meeting due by:  day 7  Magdalene S. [facility] ANP-BC Pulmonary and Critical Care Medicine [facility] Pager [phone] or [phone]  NB: This document was prepared using Dragon voice recognition software and may include unintentional dictation errors.    01/18/2018, 12:41 AM

## 2018-01-19 ENCOUNTER — Inpatient Hospital Stay: Payer: Medicare HMO

## 2018-01-19 DIAGNOSIS — J449 Chronic obstructive pulmonary disease, unspecified: Secondary | ICD-10-CM

## 2018-01-19 DIAGNOSIS — R0902 Hypoxemia: Secondary | ICD-10-CM

## 2018-01-19 LAB — CBC
HEMATOCRIT: 34.3 % — AB (ref 35.0–47.0)
Hemoglobin: 10.9 g/dL — ABNORMAL LOW (ref 12.0–16.0)
MCH: 30 pg (ref 26.0–34.0)
MCHC: 31.9 g/dL — ABNORMAL LOW (ref 32.0–36.0)
MCV: 94.1 fL (ref 80.0–100.0)
PLATELETS: 336 10*3/uL (ref 150–440)
RBC: 3.65 MIL/uL — ABNORMAL LOW (ref 3.80–5.20)
RDW: 14.1 % (ref 11.5–14.5)
WBC: 18.9 10*3/uL — ABNORMAL HIGH (ref 3.6–11.0)

## 2018-01-19 LAB — CREATININE, SERUM
CREATININE: 0.57 mg/dL (ref 0.44–1.00)
GFR calc Af Amer: >60 mL/min (ref >60)
GFR calc non Af Amer: >60 mL/min (ref >60)

## 2018-01-19 LAB — BASIC METABOLIC PANEL
Anion gap: 7 (ref 5–15)
BUN: 14 mg/dL (ref 6–20)
CALCIUM: 9.3 mg/dL (ref 8.9–10.3)
CO2: 30 mmol/L (ref 22–32)
Chloride: 102 mmol/L (ref 101–111)
Creatinine, Ser: 0.57 mg/dL (ref 0.44–1.00)
GFR calc Af Amer: >60 mL/min (ref >60)
GLUCOSE: 151 mg/dL — AB (ref 65–99)
Potassium: 3.7 mmol/L (ref 3.5–5.1)
Sodium: 139 mmol/L (ref 135–145)

## 2018-01-19 LAB — MAGNESIUM: Magnesium: 1.9 mg/dL (ref 1.7–2.4)

## 2018-01-19 LAB — PHOSPHORUS: Phosphorus: 2.7 mg/dL (ref 2.5–4.6)

## 2018-01-19 MED ORDER — SALINE SPRAY 0.65 % NA SOLN
1.0000 | NASAL | Status: DC | PRN
  Filled 2018-01-19 (×2): qty 44

## 2018-01-19 MED ORDER — CEPASTAT 14.5 MG MT LOZG
1.0000 | LOZENGE | OROMUCOSAL | Status: DC | PRN
  Filled 2018-01-19: qty 9

## 2018-01-19 NOTE — Progress Notes (Signed)
Patient has been awake on and off during the night. Has to use bedpan often and gets DOE. Continue on HFNC 40%, tolerating well. Able to answer the balance of her admission questions. Medicated with Tylenol at the beginning of the shift for a headache that was effective.

## 2018-01-19 NOTE — Progress Notes (Signed)
Placed on 4l [place] from HFNC, Bipap on standby.

## 2018-01-19 NOTE — Progress Notes (Signed)
[facility] - [place] [facility]   PATIENT NAME: Hailey SaaJuanita Gibbs    MR#:  [id number]  DATE OF BIRTH:  Jul 14, 1946  SUBJECTIVE:  CHIEF COMPLAINT:   Chief Complaint  Patient presents with  . Shortness of Breath  Patient successfully weaned off BiPAP, in discussion with intensivist-we will transfer to the floor later today for continued management of COPD exacerbation  REVIEW OF SYSTEMS:  CONSTITUTIONAL: No fever, fatigue or weakness.  EYES: No blurred or double vision.  EARS, NOSE, AND THROAT: No tinnitus or ear pain.  RESPIRATORY: No cough, shortness of breath, wheezing or hemoptysis.  CARDIOVASCULAR: No chest pain, orthopnea, edema.  GASTROINTESTINAL: No nausea, vomiting, diarrhea or abdominal pain.  GENITOURINARY: No dysuria, hematuria.  ENDOCRINE: No polyuria, nocturia,  HEMATOLOGY: No anemia, easy bruising or bleeding SKIN: No rash or lesion. MUSCULOSKELETAL: No joint pain or arthritis.   NEUROLOGIC: No tingling, numbness, weakness.  PSYCHIATRY: No anxiety or depression.   ROS  DRUG ALLERGIES:   Allergies  Allergen Reactions  . Codeine Anxiety         VITALS:  Blood pressure 128/69, pulse (!) 107, temperature 97.8 F (36.6 C), resp. rate (!) 26, height 5\' 5"  (1.651 m), weight 65.3 kg (143 lb 15.4 oz), SpO2 97 %.  PHYSICAL EXAMINATION:  GENERAL:  72 y.o.-year-old patient lying in the bed with no acute distress.  EYES: Pupils equal, round, reactive to light and accommodation. No scleral icterus. Extraocular muscles intact.  HEENT: Head atraumatic, normocephalic. Oropharynx and nasopharynx clear.  NECK:  Supple, no jugular venous distention. No thyroid enlargement, no tenderness.  LUNGS: Normal breath sounds bilaterally, no wheezing, rales,rhonchi or crepitation. No use of accessory muscles of respiration.  CARDIOVASCULAR: S1, S2 normal. No murmurs, rubs, or gallops.  ABDOMEN: Soft, nontender, nondistended. Bowel sounds present. No organomegaly or  mass.  EXTREMITIES: No pedal edema, cyanosis, or clubbing.  NEUROLOGIC: Cranial nerves II through XII are intact. Muscle strength 5/5 in all extremities. Sensation intact. Gait not checked.  PSYCHIATRIC: The patient is alert and oriented x 3.  SKIN: No obvious rash, lesion, or ulcer.   Physical Exam LABORATORY PANEL:   CBC Recent Labs  Lab 01/19/18 0506  WBC 18.9*  HGB 10.9*  HCT 34.3*  PLT 336   ------------------------------------------------------------------------------------------------------------------  Chemistries  Recent Labs  Lab 01/17/18 2116  01/19/18 0506  NA 136   < > 139  K 3.4*   < > 3.7  CL 95*   < > 102  CO2 29   < > 30  GLUCOSE 143*   < > 151*  BUN 13   < > 14  CREATININE 0.60   < > 0.57  0.57  CALCIUM 9.1   < > 9.3  MG  --    < > 1.9  AST 20  --   --   ALT 15  --   --   ALKPHOS 99  --   --   BILITOT 0.6  --   --    < > = values in this interval not displayed.   ------------------------------------------------------------------------------------------------------------------  Cardiac Enzymes Recent Labs  Lab 01/17/18 2116  TROPONINI <0.03   ------------------------------------------------------------------------------------------------------------------  RADIOLOGY:  Dg Chest 2 View  Result Date: 01/17/2018 CLINICAL DATA:  Shortness of breath EXAM: CHEST  2 VIEW COMPARISON:  Chest radiograph 01/14/2018 and 12/18/2017 Chest CT 12/19/2017 FINDINGS: Unchanged cardiomediastinal contours. Small amount of residual consolidation at the left lung base is unchanged. Hyperinflated lungs and diffuse interstitial prominence. Calcific aortic  atherosclerosis. No pneumothorax or sizable pleural effusion. IMPRESSION: 1. Small amount of residual consolidation in the left lower lobe, unchanged. 2. Aortic Atherosclerosis (ICD10-I70.0) and Emphysema (ICD10-J43.9). Electronically Signed   By: Deatra Robinson M.D.   On: 01/17/2018 20:47   Dg Chest Port 1  View  Result Date: 01/19/2018 CLINICAL DATA:  Respiratory failure EXAM: PORTABLE CHEST 1 VIEW COMPARISON:  01/17/2018 and prior exams FINDINGS: The cardiomediastinal silhouette is unchanged with fullness of the hilar regions. Mild bibasilar atelectasis/airspace disease has slightly increased. Probable trace pleural effusions again noted. Diffuse interstitial prominence again identified. Emphysema/COPD again noted. There is no evidence of pneumothorax or acute bony abnormality. IMPRESSION: Slightly increasing mild bibasilar atelectasis/airspace disease. No other significant change. Electronically Signed   By: Harmon Pier M.D.   On: 01/19/2018 07:37    ASSESSMENT AND PLAN:  1 acute hypoxic respiratory failure Secondary to sepsis and COPD exacerbation  Successfully weaned off BiPAP, in discussion with intensivist-we will transfer to the floor later today, continue IV Solu-Medrol with tapering as tolerated, aggressive pulmonary toilet with bronchodilator therapy, weaning O2 as tolerated  2 acute sepsis most likely secondary to acute E. coli urinary tract infection Resolved Continue empiric antibiotics, follow-up on sensitivities, IV fluids rehydration  3 acute on COPD exacerbation Resolving Plan of care as stated above   4 acute E. coli urinary tract infection Plan of care as stated above   5 chronic benign essential hypertension Stable on current regiment  6 chronic hyperlipidemia, unspecified Stable Continue statin therapy     All the records are reviewed and case discussed with Care Management/Social Workerr. Management plans discussed with the patient, family and they are in agreement.  CODE STATUS: full  TOTAL TIME TAKING CARE OF THIS PATIENT: 35 minutes.     POSSIBLE D/C IN 1-3 DAYS, DEPENDING ON CLINICAL CONDITION.   Evelena Asa Jerryl Holzhauer M.D on 01/19/2018   Between 7am to 6pm - Pager - [phone]  After 6pm go to [email or web address] - password [email or web address]  [facility]  Office  [phone]  CC: Primary care physician; Tamsen Roers, PA  Note: This dictation was prepared with Dragon dictation along with smaller phrase technology. Any transcriptional errors that result from this process are unintentional.

## 2018-01-19 NOTE — Progress Notes (Signed)
[facility] Critical Care Medicine Progess Note    SYNOPSIS   72 year old female with a past medical history remarkable for severe COPD, hypertension, hyperlipidemia, GERD, carotid artery disease, was brought in by EMS yesterday secondary to severe hypoxemia and worsening shortness of breath and increased work of breathing  ASSESSMENT/PLAN   COPD exacerbation. Patient is doing well today. If able to wean down FiO2 will transfer to the floor. Continue Solu-Medrol, will de-escalate antibiotics DC vancomycin, continue Solu-Medrol,spiriva and dulera.   Leukocytosis. Most likely reflects demargination   VENTILATOR SETTINGS: FiO2 (%):  [40 %] 40 %  INTAKE / OUTPUT:  Intake/Output Summary (Last 24 hours) at 01/19/2018 0906 Last data filed at 01/19/2018 0700 Gross per 24 hour  Intake 500 ml  Output 775 ml  Net -275 ml    Name: Hailey Gibbs MRN: [id number] DOB: December 22, 1945    ADMISSION DATE:  01/17/2018  SUBJECTIVE:   Patient on heated high flow oxygen, states she feels much better, is presently being weaned down to nasal cannula   VITAL SIGNS: Temp:  [97.8 F (36.6 C)-98.5 F (36.9 C)] 97.8 F (36.6 C) (02/10 0200) Pulse Rate:  [88-118] 98 (02/10 0600) Resp:  [15-32] 22 (02/10 0600) BP: (111-159)/(51-76) 148/74 (02/10 0600) SpO2:  [85 %-99 %] 95 % (02/10 0838) FiO2 (%):  [40 %] 40 % (02/10 0837)  PHYSICAL EXAMINATION: Physical Examination:   VS: BP (!) 148/74   Pulse 98   Temp 97.8 F (36.6 C) (Oral)   Resp (!) 22   Ht 5\' 5"  (1.651 m)   Wt 143 lb 15.4 oz (65.3 kg)   SpO2 95%   BMI 23.96 kg/m   General Appearance: No distress  Neuro:without focal findings, mental status normal. HEENT: PERRLA, EOM intact. Pulmonary: Markedly diminished breath sounds, prolonged expiratory phase, minimal wheezing CardiovascularNormal S1,S2.  No m/r/g.   Abdomen: Benign, Soft, non-tender. Skin:   warm, no rashes, no ecchymosis  Extremities: normal, no cyanosis,  clubbing.   LABORATORY PANEL:   CBC Recent Labs  Lab 01/19/18 0506  WBC 18.9*  HGB 10.9*  HCT 34.3*  PLT 336    Chemistries  Recent Labs  Lab 01/17/18 2116  01/19/18 0506  NA 136   < > 139  K 3.4*   < > 3.7  CL 95*   < > 102  CO2 29   < > 30  GLUCOSE 143*   < > 151*  BUN 13   < > 14  CREATININE 0.60   < > 0.57  0.57  CALCIUM 9.1   < > 9.3  MG  --    < > 1.9  PHOS  --    < > 2.7  AST 20  --   --   ALT 15  --   --   ALKPHOS 99  --   --   BILITOT 0.6  --   --    < > = values in this interval not displayed.    Recent Labs  Lab 01/18/18 0052  GLUCAP 150*   No results for input(s): PHART, PCO2ART, PO2ART in the last 168 hours. Recent Labs  Lab 01/17/18 2116  AST 20  ALT 15  ALKPHOS 99  BILITOT 0.6  ALBUMIN 3.8    Cardiac Enzymes Recent Labs  Lab 01/17/18 2116  TROPONINI <0.03    RADIOLOGY:  Dg Chest 2 View  Result Date: 01/17/2018 CLINICAL DATA:  Shortness of breath EXAM: CHEST  2 VIEW COMPARISON:  Chest radiograph 01/14/2018  and 12/18/2017 Chest CT 12/19/2017 FINDINGS: Unchanged cardiomediastinal contours. Small amount of residual consolidation at the left lung base is unchanged. Hyperinflated lungs and diffuse interstitial prominence. Calcific aortic atherosclerosis. No pneumothorax or sizable pleural effusion. IMPRESSION: 1. Small amount of residual consolidation in the left lower lobe, unchanged. 2. Aortic Atherosclerosis (ICD10-I70.0) and Emphysema (ICD10-J43.9). Electronically Signed   By: Deatra RobinsonKevin  Herman M.D.   On: 01/17/2018 20:47   Dg Chest Port 1 View  Result Date: 01/19/2018 CLINICAL DATA:  Respiratory failure EXAM: PORTABLE CHEST 1 VIEW COMPARISON:  01/17/2018 and prior exams FINDINGS: The cardiomediastinal silhouette is unchanged with fullness of the hilar regions. Mild bibasilar atelectasis/airspace disease has slightly increased. Probable trace pleural effusions again noted. Diffuse interstitial prominence again identified. Emphysema/COPD  again noted. There is no evidence of pneumothorax or acute bony abnormality. IMPRESSION: Slightly increasing mild bibasilar atelectasis/airspace disease. No other significant change. Electronically Signed   By: Harmon PierJeffrey  Hu M.D.   On: 01/19/2018 07:37     Tora KindredJohn Saanvi Hakala, DO  2/10/2019Patient ID: Hailey [name], female   DOB: [birth year], 72 y.o.   MRN: [id number]

## 2018-01-19 NOTE — Progress Notes (Signed)
Good day. Ate well. Attempted to discuss code status with patient. Patient became upset and required a breathing treatment. She had previously mentioned that her lungs were damaged from smoking so she occasionally smoked when she was stressed.

## 2018-01-19 NOTE — Progress Notes (Signed)
Pharmacy Antibiotic Note  Hailey Gibbs is a 72 y.o. female admitted on 01/17/2018 with sepsis/UTI.  Pharmacy has been consulted for Zosyn dosing. Patient recently treated for PNA 01/14/18 oupt. Hx Pseudomonas PNA Vancomcyin d/c 2/10  Plan: Zosyn 3.375 q 8 hours  EI ordered.  F/u Ucx   Height: 5\' 5"  (165.1 cm) Weight: 143 lb 15.4 oz (65.3 kg) IBW/kg (Calculated) : 57  Temp (24hrs), Avg:98 F (36.7 C), Min:97.8 F (36.6 C), Max:98.2 F (36.8 C)  Recent Labs  Lab 01/17/18 2116 01/18/18 0543 01/19/18 0506  WBC 28.2* 29.1* 18.9*  CREATININE 0.60 0.58 0.57  0.57  LATICACIDVEN 1.0  --   --     Estimated Creatinine Clearance: 58.9 mL/min (by C-G formula based on SCr of 0.57 mg/dL).    Allergies  Allergen Reactions  . Codeine Anxiety         Antimicrobials this admission: Vancomycin, Zosyn 2/8  >>    >>   Dose adjustments this admission:   Microbiology results: 2/8 BCx: pending 2/8 UCx: E.coli 1/10 MRSA PCR: (-)  2/8 UA: LE(-) NO2 (+)  WBC 6-30 2/8 CXR: LLL residual consolidation  Thank you for allowing pharmacy to be a part of this patient's care.  Adreyan Carbajal A 01/19/2018 4:00 PM

## 2018-01-20 LAB — URINE CULTURE: Culture: >=100000 — AB

## 2018-01-20 LAB — CBC WITH DIFFERENTIAL/PLATELET
BASOS ABS: 0 10*3/uL (ref 0–0.1)
Basophils Relative: 0 %
Eosinophils Absolute: 0 10*3/uL (ref 0–0.7)
Eosinophils Relative: 0 %
HEMATOCRIT: 33.8 % — AB (ref 35.0–47.0)
Hemoglobin: 10.9 g/dL — ABNORMAL LOW (ref 12.0–16.0)
LYMPHS ABS: 0.8 10*3/uL — AB (ref 1.0–3.6)
LYMPHS PCT: 6 %
MCH: 30.1 pg (ref 26.0–34.0)
MCHC: 32.2 g/dL (ref 32.0–36.0)
MCV: 93.7 fL (ref 80.0–100.0)
Monocytes Absolute: 0.5 10*3/uL (ref 0.2–0.9)
Monocytes Relative: 4 %
NEUTROS ABS: 11.3 10*3/uL — AB (ref 1.4–6.5)
Neutrophils Relative %: 90 %
Platelets: 363 10*3/uL (ref 150–440)
RBC: 3.6 MIL/uL — AB (ref 3.80–5.20)
RDW: 13.9 % (ref 11.5–14.5)
WBC: 12.6 10*3/uL — AB (ref 3.6–11.0)

## 2018-01-20 LAB — BASIC METABOLIC PANEL
ANION GAP: 13 (ref 5–15)
BUN: 18 mg/dL (ref 6–20)
CHLORIDE: 97 mmol/L — AB (ref 101–111)
CO2: 30 mmol/L (ref 22–32)
Calcium: 9.2 mg/dL (ref 8.9–10.3)
Creatinine, Ser: 0.52 mg/dL (ref 0.44–1.00)
GFR calc Af Amer: >60 mL/min (ref >60)
GLUCOSE: 144 mg/dL — AB (ref 65–99)
POTASSIUM: 3.3 mmol/L — AB (ref 3.5–5.1)
SODIUM: 140 mmol/L (ref 135–145)

## 2018-01-20 MED ORDER — SODIUM CHLORIDE 0.9 % IV SOLN
1.0000 g | Freq: Three times a day (TID) | INTRAVENOUS | Status: DC
  Administered 2018-01-20 (×2): 1 g via INTRAVENOUS
  Filled 2018-01-20 (×5): qty 1

## 2018-01-20 MED ORDER — METHYLPREDNISOLONE SODIUM SUCC 40 MG IJ SOLR
40.0000 mg | Freq: Two times a day (BID) | INTRAMUSCULAR | Status: DC
  Administered 2018-01-20 – 2018-01-21 (×2): 40 mg via INTRAVENOUS
  Filled 2018-01-20 (×2): qty 1

## 2018-01-20 MED ORDER — LORATADINE 10 MG PO TABS
10.0000 mg | ORAL_TABLET | Freq: Every day | ORAL | Status: DC
  Administered 2018-01-20 – 2018-01-21 (×2): 10 mg via ORAL
  Filled 2018-01-20 (×2): qty 1

## 2018-01-20 NOTE — Progress Notes (Signed)
PT Cancellation Note  Patient Details Name: Hailey Gibbs MRN: [id number] DOB: 22-Mar-1946   Cancelled Treatment:    Reason Eval/Treat Not Completed: (Consult received and chart reviewed. Patient declined participation with session despite encouragement.  States she's "too short of breath and it hurts", "just not up to it today"; agreeable to re-attempt next date.  [facility], feels fairly comfortable on LEs.  Will continue efforts next date as medically appropriate and patient agreeable.)   Yovan Leeman H. Manson PasseyBrown, PT, DPT, NCS 01/20/18, 1:57 PM [phone]

## 2018-01-20 NOTE — Progress Notes (Signed)
Pharmacy Antibiotic Note  Hailey Gibbs is a 72 y.o. female admitted on 01/17/2018 with sepsis/UTI.  Pharmacy has been consulted for meropenem dosing. Patient transitioned to meropenem on 2/11 secondary to ESBL UTI.   Plan: Meropenem 1g IV Q8hr.    Height: 5\' 5"  (165.1 cm) Weight: 143 lb 15.4 oz (65.3 kg) IBW/kg (Calculated) : 57  Temp (24hrs), Avg:98.4 F (36.9 C), Min:97.8 F (36.6 C), Max:98.7 F (37.1 C)  Recent Labs  Lab 01/17/18 2116 01/18/18 0543 01/19/18 0506 01/20/18 0905  WBC 28.2* 29.1* 18.9* 12.6*  CREATININE 0.60 0.58 0.57  0.57 0.52  LATICACIDVEN 1.0  --   --   --     Estimated Creatinine Clearance: 58.9 mL/min (by C-G formula based on SCr of 0.52 mg/dL).    Allergies  Allergen Reactions  . Codeine Anxiety         Antimicrobials this admission: Vancomycin 2/8 >> 2/10  Zosyn 2/8  >> 2/11  Meropenem 2/11 >>   Dose adjustments this admission:   Microbiology results: 2/8 BCx: pending 2/8 UCx: ESBL Ecoli  1/10 MRSA PCR: (-)  Thank you for allowing pharmacy to be a part of this patient's care.  Keanu Frickey L 01/20/2018 10:41 PM

## 2018-01-20 NOTE — Progress Notes (Signed)
[employer] - [place] [facility]   PATIENT NAME: Hailey SaaJuanita Gibbs    MR#:  [id number]  DATE OF BIRTH:  November 13, 1946  SUBJECTIVE:  CHIEF COMPLAINT:   Chief Complaint  Patient presents with  . Shortness of Breath  Patient without complaint  REVIEW OF SYSTEMS:  CONSTITUTIONAL: No fever, fatigue or weakness.  EYES: No blurred or double vision.  EARS, NOSE, AND THROAT: No tinnitus or ear pain.  RESPIRATORY: No cough, shortness of breath, wheezing or hemoptysis.  CARDIOVASCULAR: No chest pain, orthopnea, edema.  GASTROINTESTINAL: No nausea, vomiting, diarrhea or abdominal pain.  GENITOURINARY: No dysuria, hematuria.  ENDOCRINE: No polyuria, nocturia,  HEMATOLOGY: No anemia, easy bruising or bleeding SKIN: No rash or lesion. MUSCULOSKELETAL: No joint pain or arthritis.   NEUROLOGIC: No tingling, numbness, weakness.  PSYCHIATRY: No anxiety or depression.   ROS  DRUG ALLERGIES:   Allergies  Allergen Reactions  . Codeine Anxiety         VITALS:  Blood pressure (!) 146/71, pulse (!) 101, temperature 97.8 F (36.6 C), temperature source Axillary, resp. rate 18, height 5\' 5"  (1.651 m), weight 65.3 kg (143 lb 15.4 oz), SpO2 92 %.  PHYSICAL EXAMINATION:  GENERAL:  72 y.o.-year-old patient lying in the bed with no acute distress.  EYES: Pupils equal, round, reactive to light and accommodation. No scleral icterus. Extraocular muscles intact.  HEENT: Head atraumatic, normocephalic. Oropharynx and nasopharynx clear.  NECK:  Supple, no jugular venous distention. No thyroid enlargement, no tenderness.  LUNGS: Normal breath sounds bilaterally, no wheezing, rales,rhonchi or crepitation. No use of accessory muscles of respiration.  CARDIOVASCULAR: S1, S2 normal. No murmurs, rubs, or gallops.  ABDOMEN: Soft, nontender, nondistended. Bowel sounds present. No organomegaly or mass.  EXTREMITIES: No pedal edema, cyanosis, or clubbing.  NEUROLOGIC: Cranial nerves II through XII  are intact. Muscle strength 5/5 in all extremities. Sensation intact. Gait not checked.  PSYCHIATRIC: The patient is alert and oriented x 3.  SKIN: No obvious rash, lesion, or ulcer.   Physical Exam LABORATORY PANEL:   CBC Recent Labs  Lab 01/20/18 0905  WBC 12.6*  HGB 10.9*  HCT 33.8*  PLT 363   ------------------------------------------------------------------------------------------------------------------  Chemistries  Recent Labs  Lab 01/17/18 2116  01/19/18 0506 01/20/18 0905  NA 136   < > 139 140  K 3.4*   < > 3.7 3.3*  CL 95*   < > 102 97*  CO2 29   < > 30 30  GLUCOSE 143*   < > 151* 144*  BUN 13   < > 14 18  CREATININE 0.60   < > 0.57  0.57 0.52  CALCIUM 9.1   < > 9.3 9.2  MG  --    < > 1.9  --   AST 20  --   --   --   ALT 15  --   --   --   ALKPHOS 99  --   --   --   BILITOT 0.6  --   --   --    < > = values in this interval not displayed.   ------------------------------------------------------------------------------------------------------------------  Cardiac Enzymes Recent Labs  Lab 01/17/18 2116  TROPONINI <0.03   ------------------------------------------------------------------------------------------------------------------  RADIOLOGY:  Dg Chest Port 1 View  Result Date: 01/19/2018 CLINICAL DATA:  Respiratory failure EXAM: PORTABLE CHEST 1 VIEW COMPARISON:  01/17/2018 and prior exams FINDINGS: The cardiomediastinal silhouette is unchanged with fullness of the hilar regions. Mild bibasilar atelectasis/airspace disease has slightly increased.  Probable trace pleural effusions again noted. Diffuse interstitial prominence again identified. Emphysema/COPD again noted. There is no evidence of pneumothorax or acute bony abnormality. IMPRESSION: Slightly increasing mild bibasilar atelectasis/airspace disease. No other significant change. Electronically Signed   By: Harmon Pier M.D.   On: 01/19/2018 07:37    ASSESSMENT AND PLAN:  1 acute hypoxic  respiratory failure Resolving Secondary to sepsis and COPD exacerbation  Successfully weaned off BiPAP, continue IV Solu-Medrol with tapering as tolerated, BTs prn, weaning O2 as tolerated to baseline of 2 L via nasal cannula continuous  2acute sepsis most likely secondary to acute E. coli urinary tract infection Resolved Continue empiric antibiotics  3acute on COPD exacerbation Resolving Plan of care as stated above   4 acute E. coli urinary tract infection Resolving Sensitivities noted Continue antibiotics   5chronic benign essential hypertension Stable on current regiment  6chronic hyperlipidemia, unspecified Stable Continue statin therapy  All the records are reviewed and case discussed with Care Management/Social Workerr. Management plans discussed with the patient, family and they are in agreement.  CODE STATUS:full  TOTAL TIME TAKING CARE OF THIS PATIENT: 35 minutes.     POSSIBLE D/C IN 1-2 DAYS, DEPENDING ON CLINICAL CONDITION.   Evelena Asa Shanta Dorvil M.D on 01/20/2018   Between 7am to 6pm - Pager - [phone]  After 6pm go to [email or web address] - password [email or web address]  [facility]  Office  [phone]  CC: Primary care physician; Tamsen Roers, PA  Note: This dictation was prepared with Dragon dictation along with smaller phrase technology. Any transcriptional errors that result from this process are unintentional.

## 2018-01-21 DIAGNOSIS — Z515 Encounter for palliative care: Secondary | ICD-10-CM

## 2018-01-21 DIAGNOSIS — J9601 Acute respiratory failure with hypoxia: Secondary | ICD-10-CM

## 2018-01-21 DIAGNOSIS — Z7189 Other specified counseling: Secondary | ICD-10-CM

## 2018-01-21 MED ORDER — AMOXICILLIN-POT CLAVULANATE 875-125 MG PO TABS: 1.0000 | ORAL_TABLET | Freq: Two times a day (BID) | ORAL | 0 refills | Status: DC

## 2018-01-21 MED ORDER — POTASSIUM CHLORIDE 20 MEQ PO PACK
40.0000 meq | PACK | Freq: Once | ORAL | Status: AC
  Administered 2018-01-21: 40 meq via ORAL
  Filled 2018-01-21: qty 2

## 2018-01-21 MED ORDER — PREDNISONE 50 MG PO TABS: ORAL_TABLET | ORAL | 0 refills | Status: DC

## 2018-01-21 NOTE — Consult Note (Signed)
Consultation Note Date: 01/21/2018   Patient Name: Hailey Gibbs  DOB: 08-14-1946  MRN: [id number]  Age / Sex: 72 y.o., female  PCP: Tamsen Roers, PA Referring Physician: Bertrum Sol, MD  Reason for Consultation: Establishing goals of care and Psychosocial/spiritual support  HPI/Patient Profile: 72 y.o. female   admitted on 01/17/2018 with past medical history significant for progressing COPD, hyperlipidemia, hypertension, depression, carotid arterial occlusion.  She was recently treated for pneumonia    She was admitted with shortness of breath.  Patient required BiPAP on admission.  She was admitted for further evaluation and treatment.  She responded well, and tells me this morning that she is back to baseline.  Patient anticipates discharge home today.  Palliative medicine consulted for introduction and education regarding the role of palliative medicine in a holistic treatment plan.  Clinical Assessment and Goals of Care:  This NP Lorinda Creed reviewed medical records, received report from team, assessed the patient and then meet at the patient's bedside  to discuss diagnosis, prognosis, GOC, EOL wishes disposition and options.  Concept of Palliative Care was discussed  A  discussion was had today regarding advanced directives.  Concepts specific to code status, artifical feeding was had.   Values and goals of care important to patient and family were attempted to be elicited.   Questions and concerns addressed.   Family encouraged to call with questions or concerns.    Patient declined outpatient palliative services   SUMMARY OF RECOMMENDATIONS    Code Status/Advance Care Planning:  Full code-patient verbalizes her wishes to never be maintained long-term on any life support, "my family understands this"    Additional Recommendations (Limitations, Scope, Preferences):  Full  Scope Treatment  Psycho-social/Spiritual:   Desire for further Chaplaincy support:no  Additional Recommendations: Education on Hospice  Prognosis:   Unable to determine  Discharge Planning:           Home to her previous living situation,  She lives with her granddaughter.  Granddaughter is very helpful to the patient and maintenance of the house.  This is a "win-win" situation     Primary Diagnoses: Present on Admission: . BP (high blood pressure) . Anxiety and depression . Chronic obstructive pulmonary disease with hypoxia (HCC) . Hyperlipidemia . Sepsis (HCC) . UTI (urinary tract infection)   I have reviewed the medical record, interviewed the patient and family, and examined the patient. The following aspects are pertinent.  Past Medical History:  Diagnosis Date  . Anxiety   . Carotid artery occlusion   . COPD (chronic obstructive pulmonary disease) (HCC)   . Depression   . GERD (gastroesophageal reflux disease)   . Hyperlipidemia   . Hypertension   . Seasonal allergies   . Shortness of breath dyspnea    Social History   Socioeconomic History  . Marital status: Divorced    Spouse name: None  . Number of children: None  . Years of education: None  . Highest education level: None  Social Needs  .  Financial resource strain: None  . Food insecurity - worry: None  . Food insecurity - inability: None  . Transportation needs - medical: None  . Transportation needs - non-medical: None  Occupational History  . None  Tobacco Use  . Smoking status: Current Every Day Smoker    Packs/day: 0.50    Years: 45.00    Pack years: 22.50    Types: Cigarettes  . Smokeless tobacco: Never Used  . Tobacco comment: pt uses nictine patches occasioanlly   Substance and Sexual Activity  . Alcohol use: No    Alcohol/week: 0.0 oz  . Drug use: No  . Sexual activity: None  Other Topics Concern  . None  Social History Narrative  . None   Family History  Problem Relation  Age of Onset  . Heart disease Father 20       CABG   . Hyperlipidemia Father   . Pneumonia Father   . Dementia Mother   . COPD Mother   . Diabetes Sister   . Parkinson's disease Brother   . Post-traumatic stress disorder Son    Scheduled Meds: . amLODipine  10 mg Oral Daily  . aspirin  81 mg Oral QHS  . atorvastatin  10 mg Oral Daily  . chlorhexidine  15 mL Mouth Rinse BID  . enoxaparin (LOVENOX) injection  40 mg Subcutaneous Q24H  . loratadine  10 mg Oral Daily  . mouth rinse  15 mL Mouth Rinse q12n4p  . methylPREDNISolone (SOLU-MEDROL) injection  40 mg Intravenous BID  . mometasone-formoterol  2 puff Inhalation BID  . ondansetron (ZOFRAN) IV  4 mg Intravenous Once  . sertraline  100 mg Oral QHS  . tiotropium  18 mcg Inhalation Daily   Continuous Infusions: . meropenem (MERREM) IV Stopped (01/20/18 2329)   PRN Meds:.acetaminophen **OR** acetaminophen, ipratropium-albuterol, ondansetron **OR** ondansetron (ZOFRAN) IV, phenol-menthol, sodium chloride Medications Prior to Admission:  Prior to Admission medications   Medication Sig Start Date End Date Taking? Authorizing Provider  acetaminophen (TYLENOL) 500 MG tablet Take 1,000 mg by mouth every 6 (six) hours as needed for mild pain or headache.    Yes [provider]  albuterol (VENTOLIN HFA) 108 (90 Base) MCG/ACT inhaler INHALE 1-2 PUFFS 4 TIMES A DAY AS NEEDED FOR WHEEZING 12/23/17  Yes Chrismon, Jodell Cipro, PA  amLODipine (NORVASC) 10 MG tablet TAKE 1 TABLET (10 MG TOTAL) BY MOUTH DAILY. 06/07/17  Yes Chrismon, Jodell Cipro, PA  aspirin 81 MG chewable tablet Chew 81 mg by mouth at bedtime.    Yes [provider]  atorvastatin (LIPITOR) 10 MG tablet Take 1 tablet (10 mg total) by mouth daily. 06/07/17  Yes Chrismon, Jodell Cipro, PA  azithromycin (ZITHROMAX) 250 MG tablet Take 2 tablets by mouth today then one tablet daily for 4 days. 01/14/18  Yes Chrismon, Jodell Cipro, PA  B Complex-C-Folic Acid (HM SUPER VITAMIN B COMPLEX/C  PO) Take 1 tablet by mouth daily.    Yes [provider]  calcium carbonate (OSCAL) 1500 (600 Ca) MG TABS tablet Take 600 mg of elemental calcium by mouth daily with breakfast.   Yes [provider]  cetirizine (ZYRTEC) 10 MG tablet Take 10 mg by mouth at bedtime.    Yes [provider]  Cholecalciferol (VITAMIN D3) 2000 units TABS Take 2,000 Units by mouth daily.    Yes [provider]  Multiple Vitamin (MULTIVITAMIN) tablet Take 1 tablet by mouth at bedtime.    Yes [provider]  sertraline (ZOLOFT) 100 MG tablet 1 (ONE) TABLET, ORAL, AT BEDTIME DAILY BY MOUTH 06/13/17  Yes Chrismon, Jodell CiproDennis E, PA  SPIRIVA HANDIHALER 18 MCG inhalation capsule INHALE 1 CAPSULE ONCE A DAY 01/10/17  Yes Chrismon, Jodell Ciproennis E, PA  SYMBICORT 160-4.5 MCG/ACT inhaler INHALE 2 PUFFS INTO THE LUNGS 2 (TWO) TIMES DAILY. 11/19/17  Yes Chrismon, Jodell Ciproennis E, PA  vitamin E 400 UNIT capsule Take 400 Units by mouth daily.   Yes [provider]  amoxicillin-clavulanate (AUGMENTIN) 875-125 MG tablet Take 1 tablet by mouth 2 (two) times daily for 14 days. 01/21/18 02/04/18  Salary, Evelena AsaMontell D, MD  predniSONE (DELTASONE) 50 MG tablet 1 daily 01/21/18   Salary, Evelena AsaMontell D, MD   Allergies  Allergen Reactions  . Codeine Anxiety        Review of Systems  Constitutional: Positive for fatigue.  Respiratory: Positive for shortness of breath.     Physical Exam  Constitutional: She is oriented to person, place, and time. She appears well-developed. She has a sickly appearance. Nasal cannula in place.  Cardiovascular: Normal rate, regular rhythm and normal heart sounds.  Pulmonary/Chest: She has decreased breath sounds.  Neurological: She is alert and oriented to person, place, and time.  Skin: Skin is warm and dry.    Vital Signs: BP (!) 131/52 (BP Location: Left Arm)   Pulse 93   Temp 98.1 F (36.7 C) (Oral)   Resp 20   Ht 5\' 4"  (1.626 m)   Wt 64.4 kg (141 lb 14.4 oz)   SpO2 94%    BMI 24.36 kg/m  Pain Assessment: No/denies pain POSS *See Group Information*: S-Acceptable,Sleep, easy to arouse Pain Score: 0-No pain   SpO2: SpO2: 94 % O2 Device:SpO2: 94 % O2 Flow Rate: .O2 Flow Rate (L/min): 4 L/min  IO: Intake/output summary:   Intake/Output Summary (Last 24 hours) at 01/21/2018 1001 Last data filed at 01/20/2018 2000 Gross per 24 hour  Intake 237 ml  Output -  Net 237 ml    LBM:   Baseline Weight: Weight: 63.5 kg (140 lb) Most recent weight: Weight: 64.4 kg (141 lb 14.4 oz)     Palliative Assessment/Data:   Discussed with Dr. Katheren ShamsSalary  Time In: 0900 Time Out: 1000 Time Total: 60 minutes Greater than 50%  of this time was spent counseling and coordinating care related to the above assessment and plan.  Signed by: Lorinda CreedMary Cosmo Tetreault, NP   Please contact Palliative Medicine Team phone at [phone] for questions and concerns.  For individual provider: See Loretha StaplerAmion

## 2018-01-21 NOTE — Evaluation (Signed)
Physical Therapy Evaluation Patient Details Name: Darrol AngelJuanita L Vandunk MRN: [id number] DOB: 1946-05-26 Today's Date: 01/21/2018   History of Present Illness  presented to ER secondary to SOB, malaise (recent admission for PNA); admitted with acute respiratory failure secondary to COPD exacerbation, PNA and sepsis due to UTI.  Clinical Impression  Upon evaluation, patient alert and oriented; follows all commands and demonstrates good effort with all functional activities.  Bilat Ue/LE strength and ROM grossly symmetrical and WFL for basic transfers and mobility; no focal weakness, pain reported.  Demonstrates ability to complete bed mobility indep; sit/stand, basic transfers and gait (110') without assist device, close sup.  Slow and cautious with mild deviation during head turns/changes of direction; able to self-correct without assist from therapist.   Maintains sats >91% on 4L with and without assist device; good awareness of activity pacing noted during session. Would benefit from skilled PT to address above deficits and promote optimal return to PLOF.  Will maintain on caseload throughout remaining stay to ensure continued mobility; recommend transition to home with outpatient pulmonary rehab as appropriate.    Follow Up Recommendations (outpatient pulmonary rehab follow up)    Equipment Recommendations       Recommendations for Other Services       Precautions / Restrictions Precautions Precautions: Fall Precaution Comments: contact iso Restrictions Weight Bearing Restrictions: No      Mobility  Bed Mobility Overal bed mobility: Modified Independent                Transfers Overall transfer level: Needs assistance   Transfers: Sit to/from Stand Sit to Stand: Supervision         General transfer comment: good LE strength/power, limited use of UEs required  Ambulation/Gait Ambulation/Gait assistance: Supervision Ambulation Distance (Feet): 110 Feet Assistive  device: None       General Gait Details: slow, cautious pace, but no buckling or LOB; mild deviation with head turns or change of direction, but able to self-correct without physical assist  Stairs            Wheelchair Mobility    Modified Rankin (Stroke Patients Only)       Balance Overall balance assessment: Needs assistance Sitting-balance support: No upper extremity supported;Feet supported Sitting balance-Leahy Scale: Good     Standing balance support: No upper extremity supported Standing balance-Leahy Scale: Fair                               Pertinent Vitals/Pain Pain Assessment: No/denies pain    Home Living Family/patient expects to be discharged to:: Private residence Living Arrangements: Other relatives Available Help at Discharge: Family;Available PRN/intermittently Type of Home: House Home Access: Stairs to enter Entrance Stairs-Rails: None Entrance Stairs-Number of Steps: 1 Home Layout: One level Home Equipment: None      Prior Function Level of Independence: Independent         Comments: Ind amb without AD with no fall history, Ind with ADLs, uses 2-4L O2/min at home     Hand Dominance   Dominant Hand: Right    Extremity/Trunk Assessment   Upper Extremity Assessment Upper Extremity Assessment: Overall WFL for tasks assessed    Lower Extremity Assessment Lower Extremity Assessment: Overall WFL for tasks assessed(grossly at least 4-/5 throughout)       Communication   Communication: No difficulties  Cognition Arousal/Alertness: Awake/alert Behavior During Therapy: WFL for tasks assessed/performed Overall Cognitive Status: Within Functional Limits for  tasks assessed                                        General Comments      Exercises     Assessment/Plan    PT Assessment Patient needs continued PT services  PT Problem List Decreased activity tolerance;Decreased balance;Decreased  mobility;Cardiopulmonary status limiting activity       PT Treatment Interventions DME instruction;Gait training;Stair training;Functional mobility training;Therapeutic activities;Therapeutic exercise;Balance training;Patient/family education    PT Goals (Current goals can be found in the Care Plan section)  Acute Rehab PT Goals Patient Stated Goal: to return home before lunch PT Goal Formulation: With patient Time For Goal Achievement: 02/04/18 Potential to Achieve Goals: Good Additional Goals Additional Goal #1: Indep with energy conservation, activity pacing for optimal oxygenation with functional activities    Frequency Min 2X/week   Barriers to discharge        Co-evaluation               AM-PAC PT "6 Clicks" Daily Activity  Outcome Measure Difficulty turning over in bed (including adjusting bedclothes, sheets and blankets)?: None Difficulty moving from lying on back to sitting on the side of the bed? : None Difficulty sitting down on and standing up from a chair with arms (e.g., wheelchair, bedside commode, etc,.)?: A Little Help needed moving to and from a bed to chair (including a wheelchair)?: A Little Help needed walking in hospital room?: A Little Help needed climbing 3-5 steps with a railing? : A Little 6 Click Score: 20    End of Session Equipment Utilized During Treatment: Gait belt;Oxygen Activity Tolerance: Patient tolerated treatment well Patient left: in bed;with call bell/phone within reach;with bed alarm set Nurse Communication: Mobility status PT Visit Diagnosis: Difficulty in walking, not elsewhere classified (R26.2)    Time: 4098-1191 PT Time Calculation (min) (ACUTE ONLY): 19 min   Charges:   PT Evaluation $PT Eval Low Complexity: 1 Low     PT G Codes:        Gregorey Nabor H. Manson Passey, PT, DPT, NCS 01/21/18, 11:00 AM [phone]

## 2018-01-21 NOTE — Progress Notes (Signed)
Pt discharged to home via wheelchair accompanied by family member without incident per MD order with oxygen intact per her home regimen. Prior to discharge, all discharge teachings done both written and verbal. All questions answered. Pt verbalizes understanding and agrees to comply. Pt aware she is to pick up her Augmentin and Prednisone at her pharmacy. No change in pt from AM assessment. Pt denies pain on discharge.

## 2018-01-21 NOTE — Discharge Summary (Signed)
[facility] [place] [facility]   PATIENT NAME: Hailey SaaJuanita Hausmann    MR#:  [id number]  DATE OF BIRTH:  August 01, 1946  DATE OF ADMISSION:  01/17/2018 ADMITTING PHYSICIAN: Oralia Manisavid Willis, MD  DATE OF DISCHARGE: No discharge date for patient encounter.  PRIMARY CARE PHYSICIAN: Chrismon, Jodell Ciproennis E, PA    ADMISSION DIAGNOSIS:  Respiratory distress [R06.03] Hospital-acquired pneumonia [J18.9] COPD exacerbation (HCC) [J44.1]  DISCHARGE DIAGNOSIS:  Principal Problem:   Sepsis (HCC) Active Problems:   Hyperlipidemia   Anxiety and depression   BP (high blood pressure)   Chronic obstructive pulmonary disease with hypoxia (HCC)   UTI (urinary tract infection)   SECONDARY DIAGNOSIS:   Past Medical History:  Diagnosis Date  . Anxiety   . Carotid artery occlusion   . COPD (chronic obstructive pulmonary disease) (HCC)   . Depression   . GERD (gastroesophageal reflux disease)   . Hyperlipidemia   . Hypertension   . Seasonal allergies   . Shortness of breath dyspnea     HOSPITAL COURSE:  1 acute on chronic hypoxic respiratory failure Resolved Secondary to sepsis and COPD exacerbation  Successfully weaned offBiPAP, did require ICU stay initially, provided IV Solu-Medrol, BTs prn, meropenem IV, and was successfully weaned to her baseline O2 requirement of  2-4 L at rest/activity at time of discharge  2acute sepsis most likely secondary to acuteE. coliurinary tract infection Resolved Treated with meropenem while in house, sensitivities noted   3acute on COPD exacerbation Resolved Plan of care as stated above  4acute E. coli urinary tract infection Resolved Sensitivities noted Treated with meropenem while in house  5chronic benign essential hypertension Stable on current regiment  6chronic hyperlipidemia, unspecified Stable Continued statin therapy  DISCHARGE CONDITIONS:  On day of discharge patient is afebrile, hemogram stable,  tolerating diet, ready for discharge home with appropriate follow-up with primary care provider in 1-2 days for reevaluation, for more specific details please see chart  CONSULTS OBTAINED:  Treatment Team:  [facility], [facility], MD  DRUG ALLERGIES:   Allergies  Allergen Reactions  . Codeine Anxiety         DISCHARGE MEDICATIONS:   Allergies as of 01/21/2018      Reactions   Codeine Anxiety          Medication List    STOP taking these medications   azithromycin 250 MG tablet Commonly known as:  ZITHROMAX     TAKE these medications   acetaminophen 500 MG tablet Commonly known as:  TYLENOL Take 1,000 mg by mouth every 6 (six) hours as needed for mild pain or headache.   albuterol 108 (90 Base) MCG/ACT inhaler Commonly known as:  VENTOLIN HFA INHALE 1-2 PUFFS 4 TIMES A DAY AS NEEDED FOR WHEEZING   amLODipine 10 MG tablet Commonly known as:  NORVASC TAKE 1 TABLET (10 MG TOTAL) BY MOUTH DAILY.   amoxicillin-clavulanate 875-125 MG tablet Commonly known as:  AUGMENTIN Take 1 tablet by mouth 2 (two) times daily for 14 days.   aspirin 81 MG chewable tablet Chew 81 mg by mouth at bedtime.   atorvastatin 10 MG tablet Commonly known as:  LIPITOR Take 1 tablet (10 mg total) by mouth daily.   calcium carbonate 1500 (600 Ca) MG Tabs tablet Commonly known as:  OSCAL Take 600 mg of elemental calcium by mouth daily with breakfast.   cetirizine 10 MG tablet Commonly known as:  ZYRTEC Take 10 mg by mouth at bedtime.   HM SUPER VITAMIN B COMPLEX/C  PO Take 1 tablet by mouth daily.   multivitamin tablet Take 1 tablet by mouth at bedtime.   predniSONE 50 MG tablet Commonly known as:  DELTASONE 1 daily   sertraline 100 MG tablet Commonly known as:  ZOLOFT 1 (ONE) TABLET, ORAL, AT BEDTIME DAILY BY MOUTH   SPIRIVA HANDIHALER 18 MCG inhalation capsule Generic drug:  tiotropium INHALE 1 CAPSULE ONCE A DAY   SYMBICORT 160-4.5 MCG/ACT inhaler Generic drug:   budesonide-formoterol INHALE 2 PUFFS INTO THE LUNGS 2 (TWO) TIMES DAILY.   Vitamin D3 2000 units Tabs Take 2,000 Units by mouth daily.   vitamin E 400 UNIT capsule Take 400 Units by mouth daily.        DISCHARGE INSTRUCTIONS:   If you experience worsening of your admission symptoms, develop shortness of breath, life threatening emergency, suicidal or homicidal thoughts you must seek medical attention immediately by calling 911 or calling your MD immediately  if symptoms less severe.  You Must read complete instructions/literature along with all the possible adverse reactions/side effects for all the Medicines you take and that have been prescribed to you. Take any new Medicines after you have completely understood and accept all the possible adverse reactions/side effects.   Please note  You were cared for by a hospitalist during your hospital stay. If you have any questions about your discharge medications or the care you received while you were in the hospital after you are discharged, you can call the unit and asked to speak with the hospitalist on call if the hospitalist that took care of you is not available. Once you are discharged, your primary care physician will handle any further medical issues. Please note that NO REFILLS for any discharge medications will be authorized once you are discharged, as it is imperative that you return to your primary care physician (or establish a relationship with a primary care physician if you do not have one) for your aftercare needs so that they can reassess your need for medications and monitor your lab values.    Today   CHIEF COMPLAINT:   Chief Complaint  Patient presents with  . Shortness of Breath    HISTORY OF PRESENT ILLNESS:  72 y.o. female who presents with shortness of breath and malaise.  Patient was recently treated for pneumonia.  Here in the ED she was found to be in respiratory distress, workup respiratory wise is largely  consistent with COPD.  Patient required BiPAP for her work of breathing.  She was also found to meet sepsis criteria, but does not appear to have a pulmonary infection.  She does have a UTI.  Hospitalist were called for admission VITAL SIGNS:  Blood pressure (!) 131/52, pulse 93, temperature 98.1 F (36.7 C), temperature source Oral, resp. rate 20, height 5\' 4"  (1.626 m), weight 64.4 kg (141 lb 14.4 oz), SpO2 94 %.  I/O:    Intake/Output Summary (Last 24 hours) at 01/21/2018 0948 Last data filed at 01/20/2018 2000 Gross per 24 hour  Intake 237 ml  Output -  Net 237 ml    PHYSICAL EXAMINATION:  GENERAL:  72 y.o.-year-old patient lying in the bed with no acute distress.  EYES: Pupils equal, round, reactive to light and accommodation. No scleral icterus. Extraocular muscles intact.  HEENT: Head atraumatic, normocephalic. Oropharynx and nasopharynx clear.  NECK:  Supple, no jugular venous distention. No thyroid enlargement, no tenderness.  LUNGS: Normal breath sounds bilaterally, no wheezing, rales,rhonchi or crepitation. No use of accessory muscles of  respiration.  CARDIOVASCULAR: S1, S2 normal. No murmurs, rubs, or gallops.  ABDOMEN: Soft, non-tender, non-distended. Bowel sounds present. No organomegaly or mass.  EXTREMITIES: No pedal edema, cyanosis, or clubbing.  NEUROLOGIC: Cranial nerves II through XII are intact. Muscle strength 5/5 in all extremities. Sensation intact. Gait not checked.  PSYCHIATRIC: The patient is alert and oriented x 3.  SKIN: No obvious rash, lesion, or ulcer.   DATA REVIEW:   CBC Recent Labs  Lab 01/20/18 0905  WBC 12.6*  HGB 10.9*  HCT 33.8*  PLT 363    Chemistries  Recent Labs  Lab 01/17/18 2116  01/19/18 0506 01/20/18 0905  NA 136   < > 139 140  K 3.4*   < > 3.7 3.3*  CL 95*   < > 102 97*  CO2 29   < > 30 30  GLUCOSE 143*   < > 151* 144*  BUN 13   < > 14 18  CREATININE 0.60   < > 0.57  0.57 0.52  CALCIUM 9.1   < > 9.3 9.2  MG  --    <  > 1.9  --   AST 20  --   --   --   ALT 15  --   --   --   ALKPHOS 99  --   --   --   BILITOT 0.6  --   --   --    < > = values in this interval not displayed.    Cardiac Enzymes Recent Labs  Lab 01/17/18 2116  TROPONINI <0.03    Microbiology Results  Results for orders placed or performed during the hospital encounter of 01/17/18  Culture, blood (Routine x 2)     Status: None (Preliminary result)   Collection Time: 01/17/18  9:16 PM  Result Value Ref Range Status   Specimen Description BLOOD LEFT FOREARM  Final   Special Requests   Final    BOTTLES DRAWN AEROBIC AND ANAEROBIC Blood Culture adequate volume   Culture   Final    NO GROWTH 4 DAYS Performed at [facility], [address], [place], [place] [address]    Report Status PENDING  Incomplete  Culture, blood (Routine x 2)     Status: None (Preliminary result)   Collection Time: 01/17/18  9:16 PM  Result Value Ref Range Status   Specimen Description BLOOD RIGHT ANTECUBITAL  Final   Special Requests   Final    BOTTLES DRAWN AEROBIC AND ANAEROBIC Blood Culture results may not be optimal due to an excessive volume of blood received in culture bottles   Culture   Final    NO GROWTH 4 DAYS Performed at [facility], [address] [place], [place] [address]    Report Status PENDING  Incomplete  Urine culture     Status: Abnormal   Collection Time: 01/17/18  9:16 PM  Result Value Ref Range Status   Specimen Description   Final    URINE, CLEAN CATCH Performed at [facility], [address], [place], [place] [address]    Special Requests   Final    NONE Performed at [facility], [address] [place], [place] [address]    Culture (A)  Final    >=100,000 COLONIES/mL ESCHERICHIA COLI Confirmed Extended Spectrum Beta-Lactamase Producer (ESBL).  In bloodstream infections from ESBL organisms, carbapenems are preferred over piperacillin/tazobactam. They are shown to have a lower  risk of mortality. Performed at [facility], [address] [place], [place] [address]    Report Status 01/20/2018 FINAL  Final   Organism ID, Bacteria ESCHERICHIA COLI (A)  Final      Susceptibility   Escherichia coli - MIC*    AMPICILLIN >=32 RESISTANT Resistant     CEFAZOLIN >=64 RESISTANT Resistant     CEFTRIAXONE RESISTANT Resistant     CIPROFLOXACIN >=4 RESISTANT Resistant     GENTAMICIN <=1 SENSITIVE Sensitive     IMIPENEM <=0.25 SENSITIVE Sensitive     NITROFURANTOIN <=16 SENSITIVE Sensitive     TRIMETH/SULFA >=320 RESISTANT Resistant     AMPICILLIN/SULBACTAM 4 SENSITIVE Sensitive     PIP/TAZO <=4 SENSITIVE Sensitive     Extended ESBL POSITIVE Resistant     * >=100,000 COLONIES/mL ESCHERICHIA COLI  MRSA PCR Screening     Status: None   Collection Time: 01/18/18 12:52 AM  Result Value Ref Range Status   MRSA by PCR NEGATIVE NEGATIVE Final    Comment:        The GeneXpert MRSA Assay (FDA approved for NASAL specimens only), is one component of a comprehensive MRSA colonization surveillance program. It is not intended to diagnose MRSA infection nor to guide or monitor treatment for MRSA infections. Performed at [facility], [address] [place], [place] [address]     RADIOLOGY:  No results found.  EKG:   Orders placed or performed during the hospital encounter of 01/17/18  . EKG 12-Lead  . EKG 12-Lead  . ED EKG 12-Lead  . ED EKG 12-Lead  . EKG      Management plans discussed with the patient, family and they are in agreement.  CODE STATUS:     Code Status Orders  (From admission, onward)        Start     Ordered   01/18/18 0048  Full code  Continuous     01/18/18 0047    Code Status History    Date Active Date Inactive Code Status Order ID Comments User Context   12/18/2017 14:58 12/23/2017 17:59 Full Code [id number]  Ramonita Lab, MD Inpatient   11/07/2016 14:27 11/10/2016 15:35 Full Code [id number]  Enedina Finner, MD Inpatient    06/23/2015 17:59 06/24/2015 20:36 Full Code [id number]  Annice Needy, MD Inpatient      TOTAL TIME TAKING CARE OF THIS PATIENT: 45 minutes.    Evelena Asa Anahlia Iseminger M.D on 01/21/2018 at 9:48 AM  Between 7am to 6pm - Pager - [phone]  After 6pm go to [email or web address] - password [email or web address]  [facility]  Office  [phone]  CC: Primary care physician; Tamsen Roers, PA   Note: This dictation was prepared with Dragon dictation along with smaller phrase technology. Any transcriptional errors that result from this process are unintentional.

## 2018-01-22 ENCOUNTER — Telehealth: Payer: Self-pay

## 2018-01-22 ENCOUNTER — Telehealth: Payer: Self-pay | Admitting: Family Medicine

## 2018-01-22 LAB — CULTURE, BLOOD (ROUTINE X 2)
CULTURE: NO GROWTH
Culture: NO GROWTH
SPECIAL REQUESTS: ADEQUATE

## 2018-01-22 NOTE — Telephone Encounter (Signed)
Transition Care Management Follow-Up Telephone Call   Date discharged and where: [facility] on 01/21/18  How have you been since you were released from the hospital? Pt states she has been having diarrhea since she left the hospital and thinks its due to the Augmentin. Pt declines an other s/s. Breathing is fine.  Any patient concerns? The diarrhea. (see previous telephone encounter)  Items Reviewed:   Meds: verified  Allergies: verified  Dietary Changes Reviewed: N/A  Functional Questionnaire:  Independent-I Dependent-D  ADLs:   Dressing- I    Eating- I   Maintaining continence- I   Transferring- I   Transportation- I   Meal Prep- I   Managing Meds- I  Confirmed importance and Date/Time of follow-up visits scheduled: 01/28/18 @ 10:00 AM.   Confirmed with patient if condition worsens to call PCP or go to the Emergency Dept. Patient was given office number and encouraged to call back with questions or concerns: YES

## 2018-01-22 NOTE — Telephone Encounter (Signed)
Patient was discharged [facility] and scheduled a HFU for 01/28/2018 at 10am.   She also states that the antibiotic that they put her on is making her have really bad diarrhea and she was up all night with it.  She states that she is really weak.  She is wanting to know if there is anything else she can take.  [facility] [place].  She is requesting a call back from [place] or [place]Dennis.

## 2018-01-23 ENCOUNTER — Telehealth: Payer: Self-pay | Admitting: Internal Medicine

## 2018-01-23 DIAGNOSIS — J449 Chronic obstructive pulmonary disease, unspecified: Secondary | ICD-10-CM | POA: Diagnosis not present

## 2018-01-23 NOTE — Telephone Encounter (Signed)
Patient advised. She states she took Benadryl last night and slept well. She wanted to know if she could take the Benadryl and Zyrtec together at bedtime. Advised patient not to take both antihistamines together.

## 2018-01-23 NOTE — Telephone Encounter (Signed)
Stop Augmentin and take Probiotic supplement. Drink extra fluids and keep appointment early next week. If needed, may use a little Imodium.

## 2018-01-23 NOTE — Telephone Encounter (Signed)
Correct only one or the other.

## 2018-01-23 NOTE — Telephone Encounter (Signed)
lmov to schedule appt  °

## 2018-01-23 NOTE — Telephone Encounter (Signed)
-----   Message from [facility] Ahmad, [place] sent at 1/61/09602/11/2018  3:14 PM EST ----- Regarding: FW: hfu Please schedule pt for a hosp f/u in a 30 minute slot for COPD.  Thanks, Misty ----- Message ----- From: Shane Crutchamachandran, Pradeep, MD Sent: 01/21/2018   2:35 PM To: [facility] Clinical Pool Subject: hfu                                            Pt needs hfu in 2-4 weeks for COPD.

## 2018-01-25 DIAGNOSIS — Z515 Encounter for palliative care: Secondary | ICD-10-CM

## 2018-01-25 DIAGNOSIS — J9600 Acute respiratory failure, unspecified whether with hypoxia or hypercapnia: Secondary | ICD-10-CM

## 2018-01-27 NOTE — Telephone Encounter (Signed)
lmov to schedule appt  °

## 2018-01-28 ENCOUNTER — Encounter: Payer: Self-pay | Admitting: Family Medicine

## 2018-01-28 ENCOUNTER — Ambulatory Visit (INDEPENDENT_AMBULATORY_CARE_PROVIDER_SITE_OTHER): Payer: Medicare HMO | Admitting: Family Medicine

## 2018-01-28 VITALS — BP 100/62 | HR 91 | Temp 98.2°F | Wt 141.8 lb

## 2018-01-28 DIAGNOSIS — N390 Urinary tract infection, site not specified: Secondary | ICD-10-CM | POA: Diagnosis not present

## 2018-01-28 DIAGNOSIS — R7309 Other abnormal glucose: Secondary | ICD-10-CM | POA: Diagnosis not present

## 2018-01-28 DIAGNOSIS — A4151 Sepsis due to Escherichia coli [E. coli]: Secondary | ICD-10-CM

## 2018-01-28 DIAGNOSIS — J441 Chronic obstructive pulmonary disease with (acute) exacerbation: Secondary | ICD-10-CM

## 2018-01-28 NOTE — Progress Notes (Signed)
Patient: Hailey Gibbs Female    DOB: 1946/08/26   72 y.o.   MRN: [id number] Visit Date: 01/28/2018  Today's Provider: Dortha Kern, PA   Chief Complaint  Patient presents with  . Hospitalization Follow-up   Subjective:    HPI  Follow up Hospitalization  Patient was admitted to [facility] on 01/17/18 and discharged on 01/21/18. She was treated for respiratory distress, COPD exacerbation, UTI and hospital acquired pneumonia. Treatment for this included started Augmentin and Prednisone. Telephone follow up was done on 01/22/18 She reports good compliance with treatment. She stopped taking Augmentin and started Probiotics.  She reports this condition is Improved.  ------------------------------------------------------------------------------------  Past Medical History:  Diagnosis Date  . Anxiety   . Carotid artery occlusion   . COPD (chronic obstructive pulmonary disease) (HCC)   . Depression   . GERD (gastroesophageal reflux disease)   . Hyperlipidemia   . Hypertension   . Seasonal allergies   . Shortness of breath dyspnea    Patient Active Problem List   Diagnosis Date Noted  . Acute respiratory failure (HCC)   . Palliative care by specialist   . UTI (urinary tract infection) 01/17/2018  . Sepsis (HCC) 11/07/2016  . COPD exacerbation (HCC) 10/26/2016  . Abnormal chest sounds 08/29/2015  . Obstructive chronic bronchitis with exacerbation (HCC) 08/29/2015  . Anxiety and depression 08/29/2015  . BP (high blood pressure) 08/29/2015  . Carotid stenosis 06/23/2015  . DNR (do not resuscitate) discussion 05/30/2015  . Tobacco use 05/30/2015  . Hyperlipidemia    Past Surgical History:  Procedure Laterality Date  . ENDARTERECTOMY Left 06/23/2015   Procedure: ENDARTERECTOMY CAROTID;  Surgeon: Annice Needy, MD;  Location: [facility] ORS;  Service: Vascular;  Laterality: Left;  . TUBAL LIGATION    . VAGINAL HYSTERECTOMY     Family History  Problem Relation Age of Onset  .  Heart disease Father 42       CABG   . Hyperlipidemia Father   . Pneumonia Father   . Dementia Mother   . COPD Mother   . Diabetes Sister   . Parkinson's disease Brother   . Post-traumatic stress disorder Son    Allergies  Allergen Reactions  . Codeine Anxiety         Current Outpatient Medications:  .  acetaminophen (TYLENOL) 500 MG tablet, Take 1,000 mg by mouth every 6 (six) hours as needed for mild pain or headache. , Disp: , Rfl:  .  albuterol (VENTOLIN HFA) 108 (90 Base) MCG/ACT inhaler, INHALE 1-2 PUFFS 4 TIMES A DAY AS NEEDED FOR WHEEZING, Disp: 18 Inhaler, Rfl: 2 .  amLODipine (NORVASC) 10 MG tablet, TAKE 1 TABLET (10 MG TOTAL) BY MOUTH DAILY., Disp: 90 tablet, Rfl: 3 .  aspirin 81 MG chewable tablet, Chew 81 mg by mouth at bedtime. , Disp: , Rfl:  .  atorvastatin (LIPITOR) 10 MG tablet, Take 1 tablet (10 mg total) by mouth daily., Disp: 90 tablet, Rfl: 3 .  B Complex-C-Folic Acid (HM SUPER VITAMIN B COMPLEX/C PO), Take 1 tablet by mouth daily. , Disp: , Rfl:  .  calcium carbonate (OSCAL) 1500 (600 Ca) MG TABS tablet, Take 600 mg of elemental calcium by mouth daily with breakfast., Disp: , Rfl:  .  cetirizine (ZYRTEC) 10 MG tablet, Take 10 mg by mouth at bedtime. , Disp: , Rfl:  .  Cholecalciferol (VITAMIN D3) 2000 units TABS, Take 2,000 Units by mouth daily. , Disp: , Rfl:  .  ferrous sulfate 325 (65 FE) MG tablet, Take 325 mg by mouth daily with breakfast., Disp: , Rfl:  .  Multiple Vitamin (MULTIVITAMIN) tablet, Take 1 tablet by mouth at bedtime. , Disp: , Rfl:  .  predniSONE (DELTASONE) 50 MG tablet, 1 daily, Disp: 7 tablet, Rfl: 0 .  Probiotic Product (PROBIOTIC DAILY PO), Take by mouth., Disp: , Rfl:  .  sertraline (ZOLOFT) 100 MG tablet, 1 (ONE) TABLET, ORAL, AT BEDTIME DAILY BY MOUTH, Disp: 90 tablet, Rfl: 3 .  SPIRIVA HANDIHALER 18 MCG inhalation capsule, INHALE 1 CAPSULE ONCE A DAY, Disp: 30 capsule, Rfl: 11 .  SYMBICORT 160-4.5 MCG/ACT inhaler, INHALE 2 PUFFS INTO  THE LUNGS 2 (TWO) TIMES DAILY., Disp: 10.2 Inhaler, Rfl: 3 .  vitamin E 400 UNIT capsule, Take 400 Units by mouth daily., Disp: , Rfl:   Review of Systems  Constitutional: Negative.   Respiratory: Positive for shortness of breath.        Stable and feel better on oxygen 24 hrs a day.  Cardiovascular: Negative.    Social History   Tobacco Use  . Smoking status: Current Every Day Smoker    Packs/day: 0.50    Years: 45.00    Pack years: 22.50    Types: Cigarettes  . Smokeless tobacco: Never Used  . Tobacco comment: pt uses nictine patches occasioanlly   Substance Use Topics  . Alcohol use: No    Alcohol/week: 0.0 oz   Objective:   BP 100/62 (BP Location: Right Arm, Patient Position: Sitting, Cuff Size: Normal)   Pulse 91   Temp 98.2 F (36.8 C) (Oral)   Wt 141 lb 12.8 oz (64.3 kg)   SpO2 96%   BMI 24.34 kg/m  Wt Readings from Last 3 Encounters:  01/28/18 141 lb 12.8 oz (64.3 kg)  01/21/18 141 lb 14.4 oz (64.4 kg)  01/14/18 140 lb (63.5 kg)   Physical Exam  Constitutional: She is oriented to person, place, and time. She appears well-developed and well-nourished. No distress.  HENT:  Head: Normocephalic and atraumatic.  Right Ear: Hearing normal.  Left Ear: Hearing normal.  Nose: Nose normal.  Eyes: Conjunctivae and lids are normal. Right eye exhibits no discharge. Left eye exhibits no discharge. No scleral icterus.  Neck: Neck supple.  Cardiovascular: Normal rate and regular rhythm.  Pulmonary/Chest: She is in respiratory distress.  Stable on oxygen by nasal cannula at 2-3 LPM.  Abdominal: Soft. Bowel sounds are normal.  Musculoskeletal: Normal range of motion.  Neurological: She is alert and oriented to person, place, and time.  Skin: Skin is intact. No lesion and no rash noted.  Psychiatric: She has a normal mood and affect. Her speech is normal and behavior is normal. Thought content normal.      Assessment & Plan:     1. Sepsis due to Escherichia coli  [facility](HCC) Hospitalized on 01-17-18 and discharged 01-20-18 with principal diagnosis of sepsis. Urine culture isolated large E.coli count and treated with Meropenem IV. On admission, respiratory failure due to chronic hypoxia was improved with Solu-Medrol and use of BiPAP. Was discharged on Augmentin, but this had to be stopped due to GI upset. Probiotics has allowed diarrhea to settle. Eating better and feeling more energetic. Will check routine labs to see that she is not dehydrated or showing signs of persistent infection. - CBC with Differential/Platelet - Comprehensive metabolic panel  2. COPD exacerbation (HCC) Improved since hospitalization and treatment of sepsis. Continues O2 by nasal cannula at 2-3 LPM, uses  Ventolin-HFA prn rescue, Spiriva qd and Symbicort 160-4.5 mcg/act 2 puffs BID. Recheck pending lab reports.  - CBC with Differential/Platelet  3. Urinary tract infection without hematuria, site unspecified Urine culture in the hospital isolated E.coli sensitive to Imipenem, Augmentin and Macrobid. Finished IV treatment and had to stop Augmentin due to diarrhea side effect. Recheck labs and follow up 03-04-18 as planned. Follow up sooner pending lab reports. - CBC with Differential/Platelet - Comprehensive metabolic panel  4. Elevated glucose Persistent elevation of glucose during hospitalization. With family history of DM, will check CMP and Hgb A1C. - Comprehensive metabolic panel - Hemoglobin A1c       Dortha Kern, PA  [facility]

## 2018-01-29 LAB — COMPREHENSIVE METABOLIC PANEL
ALK PHOS: 90 IU/L (ref 39–117)
ALT: 14 IU/L (ref 0–32)
AST: 13 IU/L (ref 0–40)
Albumin/Globulin Ratio: 1.7 (ref 1.2–2.2)
Albumin: 4.2 g/dL (ref 3.5–4.8)
BUN/Creatinine Ratio: 11 — ABNORMAL LOW (ref 12–28)
BUN: 7 mg/dL — ABNORMAL LOW (ref 8–27)
Bilirubin Total: 0.3 mg/dL (ref 0.0–1.2)
CALCIUM: 9.7 mg/dL (ref 8.7–10.3)
CHLORIDE: 94 mmol/L — AB (ref 96–106)
CO2: 30 mmol/L — ABNORMAL HIGH (ref 20–29)
Creatinine, Ser: 0.65 mg/dL (ref 0.57–1.00)
GFR calc Af Amer: 104 mL/min/{1.73_m2} (ref >59)
GFR, EST NON AFRICAN AMERICAN: 90 mL/min/{1.73_m2} (ref >59)
GLOBULIN, TOTAL: 2.5 g/dL (ref 1.5–4.5)
Glucose: 100 mg/dL — ABNORMAL HIGH (ref 65–99)
Potassium: 3.9 mmol/L (ref 3.5–5.2)
Sodium: 142 mmol/L (ref 134–144)
Total Protein: 6.7 g/dL (ref 6.0–8.5)

## 2018-01-29 LAB — CBC WITH DIFFERENTIAL/PLATELET
BASOS: 0 %
Basophils Absolute: 0 10*3/uL (ref 0.0–0.2)
EOS (ABSOLUTE): 0.1 10*3/uL (ref 0.0–0.4)
EOS: 1 %
HEMATOCRIT: 38.5 % (ref 34.0–46.6)
Hemoglobin: 12.1 g/dL (ref 11.1–15.9)
Immature Grans (Abs): 0.2 10*3/uL — ABNORMAL HIGH (ref 0.0–0.1)
Immature Granulocytes: 1 %
LYMPHS ABS: 2.2 10*3/uL (ref 0.7–3.1)
Lymphs: 11 %
MCH: 29.7 pg (ref 26.6–33.0)
MCHC: 31.4 g/dL — AB (ref 31.5–35.7)
MCV: 95 fL (ref 79–97)
MONOS ABS: 0.5 10*3/uL (ref 0.1–0.9)
Monocytes: 2 %
Neutrophils Absolute: 17.5 10*3/uL — ABNORMAL HIGH (ref 1.4–7.0)
Neutrophils: 85 %
Platelets: 424 10*3/uL — ABNORMAL HIGH (ref 150–379)
RBC: 4.07 x10E6/uL (ref 3.77–5.28)
RDW: 14.1 % (ref 12.3–15.4)
WBC: 20.5 10*3/uL (ref 3.4–10.8)

## 2018-01-29 LAB — HEMOGLOBIN A1C
ESTIMATED AVERAGE GLUCOSE: 108 mg/dL
Hgb A1c MFr Bld: 5.4 % (ref 4.8–5.6)

## 2018-01-30 ENCOUNTER — Telehealth: Payer: Self-pay

## 2018-01-30 MED ORDER — NITROFURANTOIN MONOHYD MACRO 100 MG PO CAPS: 100.0000 mg | ORAL_CAPSULE | Freq: Two times a day (BID) | ORAL | 0 refills | Status: DC

## 2018-01-30 NOTE — Telephone Encounter (Signed)
-----   Message from Tamsen Roersennis E Chrismon, [place] sent at 01/30/2018  8:29 AM EST ----- Blood sugar better and no sign of diabetes with normal Hgb A1C. WBC count high again but no anemia now. Urine culture isolated E.coli that is sensitive to Augmentin and Macrobid. Since the Augmentin caused so much diarrhea, will switch to the Macrobid 100 mg BID#20 and should recheck CBC with urinalysis in a week. Recheck sooner if any fever develops.

## 2018-01-30 NOTE — Telephone Encounter (Signed)
Patient advised. Follow up scheduled. RX sent to [facility] pharmacy.

## 2018-02-03 NOTE — Telephone Encounter (Signed)
lmov to schedule appt  °

## 2018-02-05 ENCOUNTER — Encounter: Payer: Self-pay | Admitting: Internal Medicine

## 2018-02-05 DIAGNOSIS — J449 Chronic obstructive pulmonary disease, unspecified: Secondary | ICD-10-CM | POA: Diagnosis not present

## 2018-02-05 DIAGNOSIS — R0902 Hypoxemia: Secondary | ICD-10-CM | POA: Diagnosis not present

## 2018-02-05 NOTE — Telephone Encounter (Signed)
Sent letter °Will await to hear back from patient ° °

## 2018-02-06 ENCOUNTER — Other Ambulatory Visit (INDEPENDENT_AMBULATORY_CARE_PROVIDER_SITE_OTHER): Payer: Self-pay | Admitting: Vascular Surgery

## 2018-02-06 DIAGNOSIS — I739 Peripheral vascular disease, unspecified: Principal | ICD-10-CM

## 2018-02-06 DIAGNOSIS — I779 Disorder of arteries and arterioles, unspecified: Secondary | ICD-10-CM

## 2018-02-09 ENCOUNTER — Emergency Department: Payer: Medicare HMO

## 2018-02-09 ENCOUNTER — Other Ambulatory Visit: Payer: Self-pay

## 2018-02-09 ENCOUNTER — Emergency Department
Admission: EM | Admit: 2018-02-09 | Discharge: 2018-02-09 | Disposition: A | Discharge status: Home / Self Care | Payer: Medicare HMO | Attending: Student in an Organized Health Care Education/Training Program | Admitting: Student in an Organized Health Care Education/Training Program

## 2018-02-09 ENCOUNTER — Other Ambulatory Visit: Payer: Self-pay | Admitting: Family Medicine

## 2018-02-09 ENCOUNTER — Encounter: Payer: Self-pay | Admitting: Emergency Medicine

## 2018-02-09 DIAGNOSIS — Z79899 Other long term (current) drug therapy: Secondary | ICD-10-CM | POA: Insufficient documentation

## 2018-02-09 DIAGNOSIS — I10 Essential (primary) hypertension: Secondary | ICD-10-CM | POA: Insufficient documentation

## 2018-02-09 DIAGNOSIS — J441 Chronic obstructive pulmonary disease with (acute) exacerbation: Secondary | ICD-10-CM | POA: Diagnosis not present

## 2018-02-09 DIAGNOSIS — Z7982 Long term (current) use of aspirin: Secondary | ICD-10-CM | POA: Insufficient documentation

## 2018-02-09 DIAGNOSIS — F17210 Nicotine dependence, cigarettes, uncomplicated: Secondary | ICD-10-CM | POA: Diagnosis not present

## 2018-02-09 DIAGNOSIS — R109 Unspecified abdominal pain: Secondary | ICD-10-CM | POA: Insufficient documentation

## 2018-02-09 DIAGNOSIS — Z66 Do not resuscitate: Secondary | ICD-10-CM | POA: Diagnosis not present

## 2018-02-09 DIAGNOSIS — J9811 Atelectasis: Secondary | ICD-10-CM | POA: Diagnosis not present

## 2018-02-09 DIAGNOSIS — M791 Myalgia, unspecified site: Secondary | ICD-10-CM | POA: Diagnosis not present

## 2018-02-09 DIAGNOSIS — Z9981 Dependence on supplemental oxygen: Secondary | ICD-10-CM | POA: Diagnosis not present

## 2018-02-09 DIAGNOSIS — R0602 Shortness of breath: Secondary | ICD-10-CM | POA: Diagnosis not present

## 2018-02-09 LAB — URINALYSIS, COMPLETE (UACMP) WITH MICROSCOPIC
BACTERIA UA: NONE SEEN
BILIRUBIN URINE: NEGATIVE
Glucose, UA: NEGATIVE mg/dL
Hgb urine dipstick: NEGATIVE
KETONES UR: 5 mg/dL — AB
LEUKOCYTES UA: NEGATIVE
NITRITE: NEGATIVE
Protein, ur: NEGATIVE mg/dL
Specific Gravity, Urine: 1.011 (ref 1.005–1.030)
pH: 5 (ref 5.0–8.0)

## 2018-02-09 LAB — CBC WITH DIFFERENTIAL/PLATELET
BASOS ABS: 0 10*3/uL (ref 0–0.1)
BASOS PCT: 1 %
Eosinophils Absolute: 1.9 10*3/uL — ABNORMAL HIGH (ref 0–0.7)
Eosinophils Relative: 18 %
HEMATOCRIT: 33.7 % — AB (ref 35.0–47.0)
HEMOGLOBIN: 11.4 g/dL — AB (ref 12.0–16.0)
Lymphocytes Relative: 21 %
Lymphs Abs: 2.2 10*3/uL (ref 1.0–3.6)
MCH: 31.2 pg (ref 26.0–34.0)
MCHC: 33.9 g/dL (ref 32.0–36.0)
MCV: 92 fL (ref 80.0–100.0)
MONOS PCT: 6 %
Monocytes Absolute: 0.6 10*3/uL (ref 0.2–0.9)
NEUTROS ABS: 5.6 10*3/uL (ref 1.4–6.5)
NEUTROS PCT: 54 %
Platelets: 388 10*3/uL (ref 150–440)
RBC: 3.66 MIL/uL — ABNORMAL LOW (ref 3.80–5.20)
RDW: 13.8 % (ref 11.5–14.5)
WBC: 10.3 10*3/uL (ref 3.6–11.0)

## 2018-02-09 LAB — COMPREHENSIVE METABOLIC PANEL
ALBUMIN: 3.7 g/dL (ref 3.5–5.0)
ALT: 13 U/L — ABNORMAL LOW (ref 14–54)
ANION GAP: 7 (ref 5–15)
AST: 17 U/L (ref 15–41)
Alkaline Phosphatase: 112 U/L (ref 38–126)
BILIRUBIN TOTAL: 0.4 mg/dL (ref 0.3–1.2)
BUN: 9 mg/dL (ref 6–20)
CO2: 34 mmol/L — ABNORMAL HIGH (ref 22–32)
Calcium: 9.4 mg/dL (ref 8.9–10.3)
Chloride: 97 mmol/L — ABNORMAL LOW (ref 101–111)
Creatinine, Ser: 0.52 mg/dL (ref 0.44–1.00)
GFR calc Af Amer: >60 mL/min (ref >60)
GFR calc non Af Amer: >60 mL/min (ref >60)
GLUCOSE: 115 mg/dL — AB (ref 65–99)
POTASSIUM: 3.5 mmol/L (ref 3.5–5.1)
Sodium: 138 mmol/L (ref 135–145)
TOTAL PROTEIN: 6.9 g/dL (ref 6.5–8.1)

## 2018-02-09 LAB — LACTIC ACID, PLASMA: Lactic Acid, Venous: 0.5 mmol/L (ref 0.5–1.9)

## 2018-02-09 LAB — TROPONIN I

## 2018-02-09 MED ORDER — PREDNISONE 20 MG PO TABS: 40.0000 mg | ORAL_TABLET | Freq: Every day | ORAL | 0 refills | Status: AC

## 2018-02-09 MED ORDER — ACETAMINOPHEN 500 MG PO TABS
ORAL_TABLET | ORAL | Status: AC
  Administered 2018-02-09: 1000 mg via ORAL
  Filled 2018-02-09: qty 2

## 2018-02-09 MED ORDER — METHYLPREDNISOLONE SODIUM SUCC 125 MG IJ SOLR
INTRAMUSCULAR | Status: AC
  Administered 2018-02-09: 125 mg via INTRAVENOUS
  Filled 2018-02-09: qty 2

## 2018-02-09 MED ORDER — IPRATROPIUM-ALBUTEROL 0.5-2.5 (3) MG/3ML IN SOLN
3.0000 mL | Freq: Once | RESPIRATORY_TRACT | Status: AC
  Administered 2018-02-09: 3 mL via RESPIRATORY_TRACT

## 2018-02-09 MED ORDER — IPRATROPIUM-ALBUTEROL 0.5-2.5 (3) MG/3ML IN SOLN
RESPIRATORY_TRACT | Status: AC
  Administered 2018-02-09: 3 mL via RESPIRATORY_TRACT
  Filled 2018-02-09: qty 6

## 2018-02-09 MED ORDER — AZITHROMYCIN 250 MG PO TABS: ORAL_TABLET | ORAL | 0 refills | Status: DC

## 2018-02-09 MED ORDER — ACETAMINOPHEN 500 MG PO TABS
1000.0000 mg | ORAL_TABLET | Freq: Once | ORAL | Status: AC
  Administered 2018-02-09: 1000 mg via ORAL

## 2018-02-09 MED ORDER — METHYLPREDNISOLONE SODIUM SUCC 125 MG IJ SOLR
125.0000 mg | Freq: Once | INTRAMUSCULAR | Status: AC
  Administered 2018-02-09: 125 mg via INTRAVENOUS

## 2018-02-09 NOTE — ED Provider Notes (Signed)
[facility] Emergency Department Provider Note    First MD Initiated Contact with Patient 02/09/18 1823     (approximate)  I have reviewed the triage vital signs and the nursing notes.   HISTORY  Chief Complaint Abdominal Pain    HPI Hailey Gibbs is a 72 y.o. female with a history of COPD with recent admission to the hospital for COPD exacerbation with sepsis secondary to E. coli that was ESBL presents to the ER with generalized malaise, backache, shortness of breath.  Denies any fevers.  Does have some right-sided abdominal pain and right flank pain.  Denies any dysuria.  Typically on 2-3 L nasal cannula chronically.  Was found to be hypoxic to 87% at home.  Has been on steroids and recently completed her course of antibiotics.  States the discomfort is mild to moderate in severity.   Past Medical History:  Diagnosis Date  . Anxiety   . Carotid artery occlusion   . COPD (chronic obstructive pulmonary disease) (HCC)   . Depression   . GERD (gastroesophageal reflux disease)   . Hyperlipidemia   . Hypertension   . Seasonal allergies   . Shortness of breath dyspnea    Family History  Problem Relation Age of Onset  . Heart disease Father 11       CABG   . Hyperlipidemia Father   . Pneumonia Father   . Dementia Mother   . COPD Mother   . Diabetes Sister   . Parkinson's disease Brother   . Post-traumatic stress disorder Son    Past Surgical History:  Procedure Laterality Date  . ENDARTERECTOMY Left 06/23/2015   Procedure: ENDARTERECTOMY CAROTID;  Surgeon: Annice Needy, MD;  Location: [facility] ORS;  Service: Vascular;  Laterality: Left;  . TUBAL LIGATION    . VAGINAL HYSTERECTOMY     Patient Active Problem List   Diagnosis Date Noted  . Acute respiratory failure (HCC)   . Palliative care by specialist   . UTI (urinary tract infection) 01/17/2018  . Sepsis (HCC) 11/07/2016  . COPD exacerbation (HCC) 10/26/2016  . Abnormal chest sounds  08/29/2015  . Obstructive chronic bronchitis with exacerbation (HCC) 08/29/2015  . Anxiety and depression 08/29/2015  . BP (high blood pressure) 08/29/2015  . Carotid stenosis 06/23/2015  . DNR (do not resuscitate) discussion 05/30/2015  . Tobacco use 05/30/2015  . Hyperlipidemia       Prior to Admission medications   Medication Sig Start Date End Date Taking? Authorizing Provider  acetaminophen (TYLENOL) 500 MG tablet Take 1,000 mg by mouth every 6 (six) hours as needed for mild pain or headache.    Yes [provider]  albuterol (VENTOLIN HFA) 108 (90 Base) MCG/ACT inhaler INHALE 1-2 PUFFS 4 TIMES A DAY AS NEEDED FOR WHEEZING 12/23/17  Yes Chrismon, Jodell Cipro, PA  amLODipine (NORVASC) 10 MG tablet TAKE 1 TABLET (10 MG TOTAL) BY MOUTH DAILY. 06/07/17  Yes Chrismon, Jodell Cipro, PA  aspirin 81 MG chewable tablet Chew 81 mg by mouth at bedtime.    Yes [provider]  atorvastatin (LIPITOR) 10 MG tablet Take 1 tablet (10 mg total) by mouth daily. 06/07/17  Yes Chrismon, Jodell Cipro, PA  B Complex-C-Folic Acid (HM SUPER VITAMIN B COMPLEX/C PO) Take 1 tablet by mouth daily.    Yes [provider]  calcium carbonate (OSCAL) 1500 (600 Ca) MG TABS tablet Take 600 mg of elemental calcium by mouth daily with breakfast.   Yes [provider]  cetirizine (ZYRTEC) 10 MG tablet Take 10 mg by mouth at bedtime.    Yes [provider]  Cholecalciferol (VITAMIN D3) 2000 units TABS Take 2,000 Units by mouth daily.    Yes [provider]  ferrous sulfate 325 (65 FE) MG tablet Take 325 mg by mouth daily with breakfast.   Yes [provider]  Multiple Vitamin (MULTIVITAMIN) tablet Take 1 tablet by mouth at bedtime.    Yes [provider]  Probiotic Product (PROBIOTIC DAILY PO) Take by mouth.   Yes [provider]  sertraline (ZOLOFT) 100 MG tablet 1 (ONE) TABLET, ORAL, AT BEDTIME DAILY BY MOUTH 06/13/17  Yes Chrismon, Jodell Cipro, PA  SPIRIVA  HANDIHALER 18 MCG inhalation capsule INHALE 1 CAPSULE ONCE A DAY 01/10/17  Yes Chrismon, Jodell Cipro, PA  SYMBICORT 160-4.5 MCG/ACT inhaler INHALE 2 PUFFS INTO THE LUNGS 2 (TWO) TIMES DAILY. 11/19/17  Yes Chrismon, Jodell Cipro, PA  vitamin E 400 UNIT capsule Take 400 Units by mouth daily.   Yes [provider]  nitrofurantoin, macrocrystal-monohydrate, (MACROBID) 100 MG capsule Take 1 capsule (100 mg total) by mouth 2 (two) times daily. Patient not taking: Reported on 02/09/2018 01/30/18   Chrismon, Jodell Cipro, PA  predniSONE (DELTASONE) 20 MG tablet Take 2 tablets (40 mg total) by mouth daily for 5 days. 02/09/18 02/14/18  Willy Eddy, MD    Allergies Codeine    Social History Social History   Tobacco Use  . Smoking status: Current Every Day Smoker    Packs/day: 0.50    Years: 45.00    Pack years: 22.50    Types: Cigarettes  . Smokeless tobacco: Never Used  . Tobacco comment: pt uses nictine patches occasioanlly   Substance Use Topics  . Alcohol use: No    Alcohol/week: 0.0 oz  . Drug use: No    Review of Systems Patient denies headaches, rhinorrhea, blurry vision, numbness, shortness of breath, chest pain, edema, cough, abdominal pain, nausea, vomiting, diarrhea, dysuria, fevers, rashes or hallucinations unless otherwise stated above in HPI. ____________________________________________   PHYSICAL EXAM:  VITAL SIGNS: Vitals:   02/09/18 1915 02/09/18 2000  BP: 110/63 112/72  Pulse: (!) 106 (!) 110  Resp: (!) 29 (!) 22  Temp:    SpO2: 91% 91%  Constitutional: Alert and oriented. Ill appearing, no moderate respiratory distress. Eyes: Conjunctivae are normal.  Head: Atraumatic. Nose: No congestion/rhinnorhea. Mouth/Throat: Mucous membranes are moist.   Neck: No stridor. Painless ROM.  Cardiovascular: Normal rate, regular rhythm. Grossly normal heart sounds.  Good peripheral circulation. Respiratory: tachypnea with prolonged expiratory phase, speaking in short phrases  with use of accessory muscles Gastrointestinal: Soft and nontender. No distention. No abdominal bruits. No CVA tenderness. Genitourinary:  Musculoskeletal: No lower extremity tenderness nor edema.  No joint effusions. Neurologic:  Normal speech and language. No gross focal neurologic deficits are appreciated. No facial droop Skin:  Skin is warm, dry and intact. No rash noted. Psychiatric: Mood and affect are normal. Speech and behavior are normal.____________________________________________   LABS (all labs ordered are listed, but only abnormal results are displayed)  Results for orders placed or performed during the hospital encounter of 02/09/18 (from the past 24 hour(s))  Lactic acid, plasma     Status: None   Collection Time: 02/09/18  6:11 PM  Result Value Ref Range   Lactic Acid, Venous 0.5 0.5 - 1.9 mmol/L  Comprehensive metabolic panel     Status: Abnormal   Collection Time: 02/09/18  6:11  PM  Result Value Ref Range   Sodium 138 135 - 145 mmol/L   Potassium 3.5 3.5 - 5.1 mmol/L   Chloride 97 (L) 101 - 111 mmol/L   CO2 34 (H) 22 - 32 mmol/L   Glucose, Bld 115 (H) 65 - 99 mg/dL   BUN 9 6 - 20 mg/dL   Creatinine, Ser 1.610.52 0.44 - 1.00 mg/dL   Calcium 9.4 8.9 - 09.610.3 mg/dL   Total Protein 6.9 6.5 - 8.1 g/dL   Albumin 3.7 3.5 - 5.0 g/dL   AST 17 15 - 41 U/L   ALT 13 (L) 14 - 54 U/L   Alkaline Phosphatase 112 38 - 126 U/L   Total Bilirubin 0.4 0.3 - 1.2 mg/dL   GFR calc non Af Amer >60 >60 mL/min   GFR calc Af Amer >60 >60 mL/min   Anion gap 7 5 - 15  Troponin I     Status: None   Collection Time: 02/09/18  6:11 PM  Result Value Ref Range   Troponin I <0.03 <0.03 ng/mL  CBC WITH DIFFERENTIAL     Status: Abnormal   Collection Time: 02/09/18  6:11 PM  Result Value Ref Range   WBC 10.3 3.6 - 11.0 K/uL   RBC 3.66 (L) 3.80 - 5.20 MIL/uL   Hemoglobin 11.4 (L) 12.0 - 16.0 g/dL   HCT 04.533.7 (L) 40.935.0 - 81.147.0 %   MCV 92.0 80.0 - 100.0 fL   MCH 31.2 26.0 - 34.0 pg   MCHC 33.9 32.0  - 36.0 g/dL   RDW 91.413.8 78.211.5 - 95.614.5 %   Platelets 388 150 - 440 K/uL   Neutrophils Relative % 54 %   Neutro Abs 5.6 1.4 - 6.5 K/uL   Lymphocytes Relative 21 %   Lymphs Abs 2.2 1.0 - 3.6 K/uL   Monocytes Relative 6 %   Monocytes Absolute 0.6 0.2 - 0.9 K/uL   Eosinophils Relative 18 %   Eosinophils Absolute 1.9 (H) 0 - 0.7 K/uL   Basophils Relative 1 %   Basophils Absolute 0.0 0 - 0.1 K/uL   ____________________________________________  EKG My review and personal interpretation at Time: 18:01   Indication: sob  Rate: 100  Rhythm: sinus Axis: normal Other: normal intervals, por r wave progression, no stemi ____________________________________________  RADIOLOGY  I personally reviewed all radiographic images ordered to evaluate for the above acute complaints and reviewed radiology reports and findings.  These findings were personally discussed with the patient.  Please see medical record for radiology report.  ____________________________________________   PROCEDURES  Procedure(s) performed:  Procedures    Critical Care performed: no ____________________________________________   INITIAL IMPRESSION / ASSESSMENT AND PLAN / ED COURSE  Pertinent labs & imaging results that were available during my care of the patient were reviewed by me and considered in my medical decision making (see chart for details).  DDX: Asthma, copd, CHF, pna, ptx, malignancy, Pe, anemia, uti, sepsis  Reshma L Darlin DropFeaster is a 72 y.o. who presents to the ED with symptoms as described above.  Patient with recent admission the hospital.  Patient with increasing O2 requirement but in no respiratory distress.  Will give nebulizer treatments and reassess.  Given her recent history of ESBL E. coli infection will check blood work and evaluate for sepsis.  Her abdominal exam is soft benign.  The patient will be placed on continuous pulse oximetry and telemetry for monitoring.  Laboratory evaluation will be sent to  evaluate for the above complaints.        -----------------------------------------  8:33 PM on 02/09/2018 -----------------------------------------  Thus far is reassuring.  Review of medical record patient is supposed to be on 4 L nasal cannula.  No new hypoxia.  Chest x-ray is improving from previous.  Will continue nebulizer treatments.  No signs of sepsis but currently awaiting on urinalysis.  Will sign patient out to Dr. Scotty Court pending reassessment and follow-up on urinalysis.   ____________________________________________   FINAL CLINICAL IMPRESSION(S) / ED DIAGNOSES  Final diagnoses:  COPD with acute exacerbation (HCC)      NEW MEDICATIONS STARTED DURING THIS VISIT:  New Prescriptions   PREDNISONE (DELTASONE) 20 MG TABLET    Take 2 tablets (40 mg total) by mouth daily for 5 days.     Note:  This document was prepared using Dragon voice recognition software and may include unintentional dictation errors.    Willy Eddy, MD 02/09/18 2034

## 2018-02-09 NOTE — ED Notes (Addendum)
Pt sleeping soundly. RN at bedside but pt continued to sleep. RN will call son at this time.

## 2018-02-09 NOTE — ED Provider Notes (Addendum)
 -----------------------------------------   10:26 PM on 02/09/2018 -----------------------------------------  Patient reassessed. Vitals stable. Patient sleeping, easily arousable. Appears calm and comfortable. Still complains of right flank pain. Repeat abdominal exam is unremarkable, soft and benign and nontender. With her reassuring workup, have low suspicion of intra-abdominal pathology such as perforation obstruction biliary disease appendicitis or AAA or renal infarct or kidney stone.  Primary complaint appears to be her COPD and she does report that her breathing is much better after the nebs. She is agreeable to discharge home to continue her meds at home. Has a prednisone prescription written by Dr. Roxan Hockeyobinson. Also prescribe her a Z-Pak for pulmonary protection. Follow-up with primary care, return precautions discussed.  Final diagnoses:  COPD with acute exacerbation (HCC)  Right flank pain      Hailey Gibbs, Hailey Dobie, MD 02/09/18 2228  Electronic medical record list that the patient has a guardian, but does not state this individual is or how to contact them. We tried to contact family members [facility], out of the unreachable. Patient states that she does not have a guardian. She is calling family to come pick her up. As long as we can facilitate safe discharge, I think she can be discharged.   Hailey Gibbs, Hailey Stipp, MD 02/09/18 2310

## 2018-02-09 NOTE — ED Notes (Addendum)
RN called son, no answer. [facility] to [facility] RN can leave detailed message.   Pt reports she has family on the way to the ED to pick her up but pt continues to verbalize confusion as to what is happening. RN attempted to explain.

## 2018-02-09 NOTE — ED Notes (Signed)
Pt discharged to home.  Friend driving.  Discharge instructions reviewed.  Verbalized understanding.  No questions or concerns at this time.  Teach back verified.  Pt in NAD.  No items left in ED.   

## 2018-02-09 NOTE — ED Notes (Signed)
Pt drinking a soda at this time.

## 2018-02-09 NOTE — ED Triage Notes (Signed)
Pt ems from home with generalized abd pain that started Thursday and got worse on right today. Just finished antibiotics for e-coli infection. Pt on 3 LNC at home. Is SOB here.

## 2018-02-09 NOTE — ED Notes (Signed)
Pt adamantly states that she does not have a legal guardian and that she lives with Adolm Josephyler Harris, who is hear to pick her up.  Pt is A&Ox4, in NAD.  Pt can verbally tell this RN and charge nurse Donnel SaxonLea Furguson, RN where she is going and her full address.  Per charge nurse, okay to discharge patient at this time.

## 2018-02-09 NOTE — ED Notes (Signed)
Pt up to the bathroom to provide urine sample

## 2018-02-10 ENCOUNTER — Encounter: Payer: Self-pay | Admitting: Family Medicine

## 2018-02-10 ENCOUNTER — Ambulatory Visit
Admission: RE | Admit: 2018-02-10 | Discharge: 2018-02-10 | Disposition: A | Discharge status: Home / Self Care | Payer: Medicare HMO | Source: Ambulatory Visit | Attending: Family Medicine | Admitting: Family Medicine

## 2018-02-10 ENCOUNTER — Other Ambulatory Visit: Payer: Self-pay | Admitting: Family Medicine

## 2018-02-10 ENCOUNTER — Ambulatory Visit (INDEPENDENT_AMBULATORY_CARE_PROVIDER_SITE_OTHER): Payer: Medicare HMO | Admitting: Family Medicine

## 2018-02-10 VITALS — BP 102/62 | HR 111 | Temp 98.1°F | Wt 139.8 lb

## 2018-02-10 DIAGNOSIS — K8020 Calculus of gallbladder without cholecystitis without obstruction: Secondary | ICD-10-CM | POA: Diagnosis not present

## 2018-02-10 DIAGNOSIS — R1011 Right upper quadrant pain: Secondary | ICD-10-CM

## 2018-02-10 DIAGNOSIS — J441 Chronic obstructive pulmonary disease with (acute) exacerbation: Secondary | ICD-10-CM

## 2018-02-10 MED ORDER — TRAMADOL HCL 50 MG PO TABS: 50.0000 mg | ORAL_TABLET | Freq: Three times a day (TID) | ORAL | 0 refills | Status: DC | PRN

## 2018-02-10 MED ORDER — ALBUTEROL SULFATE HFA 108 (90 BASE) MCG/ACT IN AERS: INHALATION_SPRAY | RESPIRATORY_TRACT | 2 refills | Status: DC

## 2018-02-10 NOTE — Progress Notes (Signed)
Patient: Hailey Gibbs Female    DOB: 08-Jul-1946   72 y.o.   MRN: [id number] Visit Date: 02/10/2018  Today's Provider: Dortha Kernennis Keani Gotcher, PA   Chief Complaint  Patient presents with  . Follow-up   Subjective:    HPI Sepsis due to E.Coli:  Patient presents for a 1 week follow up. Last OV was on 01/28/18. Urine culture showed E.Coli. Patient advised to switch from Augmentin to Macrobid due to diarrhea and follow up in 1 week for a urine recheck. She reports good compliance with treatment plan. Patient states she was [facility] yesterday afternoon for right flank pain. Urine culture was done at that time.    Past Medical History:  Diagnosis Date  . Anxiety   . Carotid artery occlusion   . COPD (chronic obstructive pulmonary disease) (HCC)   . Depression   . GERD (gastroesophageal reflux disease)   . Hyperlipidemia   . Hypertension   . Seasonal allergies   . Shortness of breath dyspnea    Past Surgical History:  Procedure Laterality Date  . ENDARTERECTOMY Left 06/23/2015   Procedure: ENDARTERECTOMY CAROTID;  Surgeon: Annice NeedyJason S Dew, MD;  Location: [facility] ORS;  Service: Vascular;  Laterality: Left;  . TUBAL LIGATION    . VAGINAL HYSTERECTOMY     Family History  Problem Relation Age of Onset  . Heart disease Father 375       CABG   . Hyperlipidemia Father   . Pneumonia Father   . Dementia Mother   . COPD Mother   . Diabetes Sister   . Parkinson's disease Brother   . Post-traumatic stress disorder Son    Allergies  Allergen Reactions  . Codeine Anxiety         Current Outpatient Medications:  .  acetaminophen (TYLENOL) 500 MG tablet, Take 1,000 mg by mouth every 6 (six) hours as needed for mild pain or headache. , Disp: , Rfl:  .  albuterol (VENTOLIN HFA) 108 (90 Base) MCG/ACT inhaler, INHALE 1-2 PUFFS 4 TIMES A DAY AS NEEDED FOR WHEEZING, Disp: 18 Inhaler, Rfl: 2 .  amLODipine (NORVASC) 10 MG tablet, TAKE 1 TABLET (10 MG TOTAL) BY MOUTH DAILY., Disp: 90 tablet,  Rfl: 3 .  aspirin 81 MG chewable tablet, Chew 81 mg by mouth at bedtime. , Disp: , Rfl:  .  atorvastatin (LIPITOR) 10 MG tablet, Take 1 tablet (10 mg total) by mouth daily., Disp: 90 tablet, Rfl: 3 .  azithromycin (ZITHROMAX Z-PAK) 250 MG tablet, Take 2 tablets (500 mg) on  Day 1,  followed by 1 tablet (250 mg) once daily on Days 2 through 5., Disp: 6 each, Rfl: 0 .  B Complex-C-Folic Acid (HM SUPER VITAMIN B COMPLEX/C PO), Take 1 tablet by mouth daily. , Disp: , Rfl:  .  calcium carbonate (OSCAL) 1500 (600 Ca) MG TABS tablet, Take 600 mg of elemental calcium by mouth daily with breakfast., Disp: , Rfl:  .  cetirizine (ZYRTEC) 10 MG tablet, Take 10 mg by mouth at bedtime. , Disp: , Rfl:  .  Cholecalciferol (VITAMIN D3) 2000 units TABS, Take 2,000 Units by mouth daily. , Disp: , Rfl:  .  ferrous sulfate 325 (65 FE) MG tablet, Take 325 mg by mouth daily with breakfast., Disp: , Rfl:  .  Multiple Vitamin (MULTIVITAMIN) tablet, Take 1 tablet by mouth at bedtime. , Disp: , Rfl:  .  predniSONE (DELTASONE) 20 MG tablet, Take 2 tablets (40 mg  total) by mouth daily for 5 days., Disp: 10 tablet, Rfl: 0 .  Probiotic Product (PROBIOTIC DAILY PO), Take by mouth., Disp: , Rfl:  .  sertraline (ZOLOFT) 100 MG tablet, 1 (ONE) TABLET, ORAL, AT BEDTIME DAILY BY MOUTH, Disp: 90 tablet, Rfl: 3 .  SPIRIVA HANDIHALER 18 MCG inhalation capsule, INHALE 1 CAPSULE ONCE A DAY, Disp: 30 capsule, Rfl: 11 .  SYMBICORT 160-4.5 MCG/ACT inhaler, INHALE 2 PUFFS INTO THE LUNGS 2 (TWO) TIMES DAILY., Disp: 10.2 Inhaler, Rfl: 3 .  vitamin E 400 UNIT capsule, Take 400 Units by mouth daily., Disp: , Rfl:  .  nitrofurantoin, macrocrystal-monohydrate, (MACROBID) 100 MG capsule, Take 1 capsule (100 mg total) by mouth 2 (two) times daily. (Patient not taking: Reported on 02/09/2018), Disp: 20 capsule, Rfl: 0  Review of Systems  Constitutional: Negative.   Respiratory: Positive for shortness of breath.   Cardiovascular: Negative.     Gastrointestinal: Positive for abdominal pain.  Genitourinary: Negative.    Social History   Tobacco Use  . Smoking status: Current Every Day Smoker    Packs/day: 0.50    Years: 45.00    Pack years: 22.50    Types: Cigarettes  . Smokeless tobacco: Never Used  . Tobacco comment: pt uses nictine patches occasioanlly   Substance Use Topics  . Alcohol use: No    Alcohol/week: 0.0 oz   Objective:   BP 102/62 (BP Location: Right Arm, Patient Position: Sitting, Cuff Size: Normal)   Pulse (!) 111   Temp 98.1 F (36.7 C) (Oral)   Wt 139 lb 12.8 oz (63.4 kg)   SpO2 94% Comment: 3 liters of O2  BMI 23.26 kg/m   Physical Exam  Constitutional: She is oriented to person, place, and time. She appears well-developed and well-nourished. No distress.  HENT:  Head: Normocephalic and atraumatic.  Right Ear: Hearing normal.  Left Ear: Hearing normal.  Nose: Nose normal.  Eyes: Conjunctivae and lids are normal. Right eye exhibits no discharge. Left eye exhibits no discharge. No scleral icterus.  Cardiovascular:  Tachycardia.  Pulmonary/Chest: Effort normal.  Dyspnea worse with exertion. No wheeze at the present. Slight rhonchi occasionally.  Abdominal: There is tenderness.  RUQ tenderness with intermittent sharp pains. BS quiet but some activity. No masses. Questionable rebound. No CVA tenderness to percussion posteriorly.  Musculoskeletal: Normal range of motion.  Neurological: She is alert and oriented to person, place, and time.  Skin: Skin is intact. No lesion and no rash noted.  Psychiatric: She has a normal mood and affect. Her speech is normal and behavior is normal. Thought content normal.      Assessment & Plan:     1. RUQ abdominal pain Onset at noon yesterday. Described as sudden sharp pains intermittently. No gas or vomiting. No diarrhea. Went to the ER yesterday but staff was more concerned with COPD with hypoxia. Has not gotten Z-pak and prednisone prescribed at that time.  Continue oxygen at 3 LPM 24 hours a day. No hematuria or hematochezia. Urinalysis at 8:24pm in the ER was negative for infection. Blood tests showed CO2 was 30, normal liver enzymes, creatinine 0.65, GFR 90, Hgb 11.4 and WBC count 10,300. Will get stat RUQ ultrasound. She has not eaten in 24 hours but had some coffee around 8:00am today. States the ER felt she had gas and constipation. Prefers to use prune juice as a laxative and will get this accomplished when she gets home from radiology. - US Abdomen Limited RUQ  Vernie Murders, PA  [facility]

## 2018-02-11 ENCOUNTER — Telehealth: Payer: Self-pay | Admitting: Family Medicine

## 2018-02-11 ENCOUNTER — Encounter: Payer: Self-pay | Admitting: Emergency Medicine

## 2018-02-11 ENCOUNTER — Ambulatory Visit (INDEPENDENT_AMBULATORY_CARE_PROVIDER_SITE_OTHER): Payer: Medicare HMO | Admitting: Vascular Surgery

## 2018-02-11 ENCOUNTER — Emergency Department: Payer: Medicare HMO

## 2018-02-11 ENCOUNTER — Encounter (INDEPENDENT_AMBULATORY_CARE_PROVIDER_SITE_OTHER): Payer: Medicare HMO

## 2018-02-11 ENCOUNTER — Emergency Department
Admission: EM | Admit: 2018-02-11 | Discharge: 2018-02-11 | Disposition: A | Discharge status: Home / Self Care | Payer: Medicare HMO | Attending: Emergency Medicine | Admitting: Emergency Medicine

## 2018-02-11 DIAGNOSIS — J449 Chronic obstructive pulmonary disease, unspecified: Secondary | ICD-10-CM | POA: Insufficient documentation

## 2018-02-11 DIAGNOSIS — F17210 Nicotine dependence, cigarettes, uncomplicated: Secondary | ICD-10-CM | POA: Diagnosis not present

## 2018-02-11 DIAGNOSIS — R1011 Right upper quadrant pain: Secondary | ICD-10-CM | POA: Diagnosis not present

## 2018-02-11 DIAGNOSIS — K8020 Calculus of gallbladder without cholecystitis without obstruction: Secondary | ICD-10-CM | POA: Diagnosis not present

## 2018-02-11 DIAGNOSIS — F419 Anxiety disorder, unspecified: Secondary | ICD-10-CM | POA: Insufficient documentation

## 2018-02-11 DIAGNOSIS — Z79899 Other long term (current) drug therapy: Secondary | ICD-10-CM | POA: Insufficient documentation

## 2018-02-11 DIAGNOSIS — R109 Unspecified abdominal pain: Secondary | ICD-10-CM

## 2018-02-11 DIAGNOSIS — F329 Major depressive disorder, single episode, unspecified: Secondary | ICD-10-CM | POA: Insufficient documentation

## 2018-02-11 DIAGNOSIS — I10 Essential (primary) hypertension: Secondary | ICD-10-CM | POA: Diagnosis not present

## 2018-02-11 DIAGNOSIS — Z7982 Long term (current) use of aspirin: Secondary | ICD-10-CM | POA: Diagnosis not present

## 2018-02-11 DIAGNOSIS — I2510 Atherosclerotic heart disease of native coronary artery without angina pectoris: Secondary | ICD-10-CM | POA: Insufficient documentation

## 2018-02-11 DIAGNOSIS — R0602 Shortness of breath: Secondary | ICD-10-CM | POA: Diagnosis not present

## 2018-02-11 LAB — COMPREHENSIVE METABOLIC PANEL
ALT: 16 U/L (ref 14–54)
AST: 22 U/L (ref 15–41)
Albumin: 3.8 g/dL (ref 3.5–5.0)
Alkaline Phosphatase: 114 U/L (ref 38–126)
Anion gap: 9 (ref 5–15)
BUN: 11 mg/dL (ref 6–20)
CHLORIDE: 94 mmol/L — AB (ref 101–111)
CO2: 34 mmol/L — AB (ref 22–32)
Calcium: 9.4 mg/dL (ref 8.9–10.3)
Creatinine, Ser: 0.46 mg/dL (ref 0.44–1.00)
Glucose, Bld: 170 mg/dL — ABNORMAL HIGH (ref 65–99)
POTASSIUM: 3.6 mmol/L (ref 3.5–5.1)
SODIUM: 137 mmol/L (ref 135–145)
Total Bilirubin: 0.3 mg/dL (ref 0.3–1.2)
Total Protein: 7.3 g/dL (ref 6.5–8.1)

## 2018-02-11 LAB — CBC
HCT: 35.7 % (ref 35.0–47.0)
Hemoglobin: 11.5 g/dL — ABNORMAL LOW (ref 12.0–16.0)
MCH: 30.1 pg (ref 26.0–34.0)
MCHC: 32.2 g/dL (ref 32.0–36.0)
MCV: 93.5 fL (ref 80.0–100.0)
PLATELETS: 474 10*3/uL — AB (ref 150–440)
RBC: 3.82 MIL/uL (ref 3.80–5.20)
RDW: 14.2 % (ref 11.5–14.5)
WBC: 8.9 10*3/uL (ref 3.6–11.0)

## 2018-02-11 LAB — LIPASE, BLOOD: Lipase: 33 U/L (ref 11–51)

## 2018-02-11 MED ORDER — HYDROCODONE-ACETAMINOPHEN 5-325 MG PO TABS
1.0000 | ORAL_TABLET | Freq: Once | ORAL | Status: AC
Start: 2018-02-11 — End: 2018-02-11
  Administered 2018-02-11: 1 via ORAL
  Filled 2018-02-11: qty 1

## 2018-02-11 MED ORDER — FENTANYL CITRATE (PF) 100 MCG/2ML IJ SOLN
50.0000 ug | Freq: Once | INTRAMUSCULAR | Status: AC
  Administered 2018-02-11: 50 ug via INTRAVENOUS
  Filled 2018-02-11: qty 2

## 2018-02-11 MED ORDER — HYDROCODONE-ACETAMINOPHEN 5-325 MG PO TABS: 1.0000 | ORAL_TABLET | Freq: Four times a day (QID) | ORAL | 0 refills | Status: DC | PRN

## 2018-02-11 MED ORDER — ALBUTEROL SULFATE (2.5 MG/3ML) 0.083% IN NEBU
5.0000 mg | INHALATION_SOLUTION | Freq: Once | RESPIRATORY_TRACT | Status: DC
  Filled 2018-02-11: qty 6

## 2018-02-11 NOTE — Telephone Encounter (Signed)
Patient called back to report that she was going to ER for evaluation.

## 2018-02-11 NOTE — Discharge Instructions (Signed)
Please seek medical attention for any high fevers, chest pain, shortness of breath, change in behavior, persistent vomiting, bloody stool or any other new or concerning symptoms.  

## 2018-02-11 NOTE — ED Provider Notes (Signed)
[facility] Emergency Department Provider Note  ____________________________________________   I have reviewed the triage vital signs and the nursing notes.   HISTORY  Chief Complaint Abdominal Pain   History limited by: Not Limited   HPI Hailey Gibbs is a 72 y.o. female who presents to the emergency department today because of concerns for abdominal pain.  Pain is located in the right upper quadrant.  This been present for the past few days.  It does come and go although she states will only be on for an hour or so.  Pain is severe.  Saw her primary care doctor who obtained an ultrasound which showed gallstones without evidence of acute cholecystitis yesterday.  Was given prescription for tramadol.  States that the pain medication is not helping.  She denies any fever.  She has chronic shortness of breath.   Per medical record review patient has a history of COPD, gallstones.  Past Medical History:  Diagnosis Date  . Anxiety   . Carotid artery occlusion   . COPD (chronic obstructive pulmonary disease) (HCC)   . Depression   . GERD (gastroesophageal reflux disease)   . Hyperlipidemia   . Hypertension   . Seasonal allergies   . Shortness of breath dyspnea     Patient Active Problem List   Diagnosis Date Noted  . Acute respiratory failure (HCC)   . Palliative care by specialist   . UTI (urinary tract infection) 01/17/2018  . Sepsis (HCC) 11/07/2016  . COPD exacerbation (HCC) 10/26/2016  . Abnormal chest sounds 08/29/2015  . Obstructive chronic bronchitis with exacerbation (HCC) 08/29/2015  . Anxiety and depression 08/29/2015  . BP (high blood pressure) 08/29/2015  . Carotid stenosis 06/23/2015  . DNR (do not resuscitate) discussion 05/30/2015  . Tobacco use 05/30/2015  . Hyperlipidemia     Past Surgical History:  Procedure Laterality Date  . ENDARTERECTOMY Left 06/23/2015   Procedure: ENDARTERECTOMY CAROTID;  Surgeon: Annice Needy, MD;   Location: [facility] ORS;  Service: Vascular;  Laterality: Left;  . TUBAL LIGATION    . VAGINAL HYSTERECTOMY      Prior to Admission medications   Medication Sig Start Date End Date Taking? Authorizing Provider  acetaminophen (TYLENOL) 500 MG tablet Take 1,000 mg by mouth every 6 (six) hours as needed for mild pain or headache.     [provider]  albuterol (VENTOLIN HFA) 108 (90 Base) MCG/ACT inhaler INHALE 1-2 PUFFS 4 TIMES A DAY AS NEEDED FOR WHEEZING 02/10/18   Chrismon, Maurine Minister E, PA  amLODipine (NORVASC) 10 MG tablet TAKE 1 TABLET (10 MG TOTAL) BY MOUTH DAILY. 06/07/17   Chrismon, Jodell Cipro, PA  aspirin 81 MG chewable tablet Chew 81 mg by mouth at bedtime.     [provider]  atorvastatin (LIPITOR) 10 MG tablet Take 1 tablet (10 mg total) by mouth daily. 06/07/17   Chrismon, Jodell Cipro, PA  azithromycin (ZITHROMAX Z-PAK) 250 MG tablet Take 2 tablets (500 mg) on  Day 1,  followed by 1 tablet (250 mg) once daily on Days 2 through 5. 02/09/18   Sharman Cheek, MD  B Complex-C-Folic Acid (HM SUPER VITAMIN B COMPLEX/C PO) Take 1 tablet by mouth daily.     [provider]  calcium carbonate (OSCAL) 1500 (600 Ca) MG TABS tablet Take 600 mg of elemental calcium by mouth daily with breakfast.    [provider]  cetirizine (ZYRTEC) 10 MG tablet Take 10 mg by mouth at bedtime.  [provider]  Cholecalciferol (VITAMIN D3) 2000 units TABS Take 2,000 Units by mouth daily.     [provider]  ferrous sulfate 325 (65 FE) MG tablet Take 325 mg by mouth daily with breakfast.    [provider]  Multiple Vitamin (MULTIVITAMIN) tablet Take 1 tablet by mouth at bedtime.     [provider]  nitrofurantoin, macrocrystal-monohydrate, (MACROBID) 100 MG capsule Take 1 capsule (100 mg total) by mouth 2 (two) times daily. Patient not taking: Reported on 02/09/2018 01/30/18   Chrismon, Jodell Cipro, PA  predniSONE (DELTASONE) 20 MG tablet Take 2 tablets (40  mg total) by mouth daily for 5 days. 02/09/18 02/14/18  Willy Eddy, MD  Probiotic Product (PROBIOTIC DAILY PO) Take by mouth.    [provider]  sertraline (ZOLOFT) 100 MG tablet 1 (ONE) TABLET, ORAL, AT BEDTIME DAILY BY MOUTH 06/13/17   Chrismon, Jodell Cipro, PA  SPIRIVA HANDIHALER 18 MCG inhalation capsule INHALE 1 CAPSULE ONCE A DAY 02/10/18   Chrismon, Maurine Minister E, PA  SYMBICORT 160-4.5 MCG/ACT inhaler INHALE 2 PUFFS INTO THE LUNGS 2 (TWO) TIMES DAILY. 11/19/17   Chrismon, Jodell Cipro, PA  traMADol (ULTRAM) 50 MG tablet Take 1 tablet (50 mg total) by mouth every 8 (eight) hours as needed. 02/10/18   Chrismon, Jodell Cipro, PA  vitamin E 400 UNIT capsule Take 400 Units by mouth daily.    [provider]    Allergies Codeine  Family History  Problem Relation Age of Onset  . Heart disease Father 95       CABG   . Hyperlipidemia Father   . Pneumonia Father   . Dementia Mother   . COPD Mother   . Diabetes Sister   . Parkinson's disease Brother   . Post-traumatic stress disorder Son     Social History Social History   Tobacco Use  . Smoking status: Current Every Day Smoker    Packs/day: 0.50    Years: 45.00    Pack years: 22.50    Types: Cigarettes  . Smokeless tobacco: Never Used  . Tobacco comment: pt uses nictine patches occasioanlly   Substance Use Topics  . Alcohol use: No    Alcohol/week: 0.0 oz  . Drug use: No    Review of Systems Constitutional: No fever/chills Eyes: No visual changes. ENT: No sore throat. Cardiovascular: Denies chest pain. Respiratory: Positive for chronic shortness of breath. Gastrointestinal: Positive for abdominal pain. Genitourinary: Negative for dysuria. Musculoskeletal: Negative for back pain. Skin: Negative for rash. Neurological: Negative for headaches, focal weakness or numbness.  ____________________________________________   PHYSICAL EXAM:  VITAL SIGNS: ED Triage Vitals  Enc Vitals Group     BP 02/11/18 1520 (!)  124/55     Pulse Rate 02/11/18 1520 (!) 112     Resp 02/11/18 1520 18     Temp 02/11/18 1517 98.9 F (37.2 C)     Temp Source 02/11/18 1517 Oral     SpO2 02/11/18 1520 (!) 72 %     Weight 02/11/18 1516 139 lb (63 kg)     Height 02/11/18 1516 5\' 5"  (1.651 m)   Constitutional: Alert and oriented. Well appearing and in no distress. Eyes: Conjunctivae are normal.  ENT   Head: Normocephalic and atraumatic.   Nose: No congestion/rhinnorhea.   Mouth/Throat: Mucous membranes are moist.   Neck: No stridor. Hematological/Lymphatic/Immunilogical: No cervical lymphadenopathy. Cardiovascular: Normal rate, regular rhythm.  No murmurs, rubs, or gallops.  Respiratory: Normal respiratory effort without tachypnea  nor retractions. Breath sounds are clear and equal bilaterally. No wheezes/rales/rhonchi. Gastrointestinal: Soft and tender to palpation in the right upper quadrant. No rebound. No guarding.  Genitourinary: Deferred Musculoskeletal: Normal range of motion in all extremities. No lower extremity edema. Neurologic:  Normal speech and language. No gross focal neurologic deficits are appreciated.  Skin:  Skin is warm, dry and intact. No rash noted. Psychiatric: Mood and affect are normal. Speech and behavior are normal. Patient exhibits appropriate insight and judgment.  ____________________________________________    LABS (pertinent positives/negatives)  Lipase 33 CMP k 3.6, glu 170, cr 0.46 CBC wbc 8.9, hgb 11.5, plt 474  ____________________________________________   EKG  I, Phineas SemenGraydon Simon Llamas, attending physician, personally viewed and interpreted this EKG  EKG Time: 1530 Rate: 103 Rhythm: sinus tachycardia Axis: normal Intervals: qtc 406 QRS: narrow ST changes: no st elevation Impression: abnormal ekg ____________________________________________    RADIOLOGY  CXR Opacities concerning for possible  pneumonia.   ____________________________________________   PROCEDURES  Procedures  ____________________________________________   INITIAL IMPRESSION / ASSESSMENT AND PLAN / ED COURSE  Pertinent labs & imaging results that were available during my care of the patient were reviewed by me and considered in my medical decision making (see chart for details).  Patient presented to the emergency department today because of concerns for right upper quadrant pain.  Ultrasound yesterday showed gallstones but no signs of acute cholecystitis.  Patient afebrile without any leukocytosis today.  I do doubt that patient has since developed acute cholecystitis.  Patient patient is being treated for pneumonia and chest x-ray is consistent with pneumonia.  At this point I had a discussion with the patient.  I did discuss with that we could admit patient for pneumonia however I cannot guarantee that surgery would be willing to do the surgery given lack of findings of acute cholecystitis and current respiratory issues.  Patient already has surgery follow-up scheduled on Monday.  Patient was given stronger pain medications with good relief of pain.  She felt comfortable going home to follow-up with surgery.  She will continue antibiotics for pneumonia.  Did discuss return precautions.   ____________________________________________   FINAL CLINICAL IMPRESSION(S) / ED DIAGNOSES  Final diagnoses:  Gallstones  Abdominal pain, unspecified abdominal location     Note: This dictation was prepared with Dragon dictation. Any transcriptional errors that result from this process are unintentional     Phineas SemenGoodman, Hikeem Andersson, MD 02/11/18 1929

## 2018-02-11 NOTE — ED Notes (Signed)
Pt insists that she is here because she does not feel (& her surgeon does not feel) that her cholecystectomy can wait until Monday, but she was satting 64% on her imogen oxygen concentrator. Switched her to our o2 tank and she was still satting in 70's. Bumped O2 up to 4L to get her above 90%, but still dipped into 80's during triage before rising to 90%.

## 2018-02-11 NOTE — ED Triage Notes (Signed)
Pt sent from PCP  For acute cholecystitis,  Pt c/o RUQ pain since Thursday.

## 2018-02-11 NOTE — Telephone Encounter (Signed)
I hate to mention it, but ,if the pain is worsening, you should go to the ER because you may need surgery for these gallstones.

## 2018-02-11 NOTE — Telephone Encounter (Signed)
Pt states she was seen yesterday for pain in the right side and the pain medication is not working at all.  States she wants to know if something should be done.

## 2018-02-11 NOTE — ED Notes (Signed)
EDP at bedside to update pt and family 

## 2018-02-11 NOTE — ED Notes (Signed)
Family at bedside. 

## 2018-02-11 NOTE — Telephone Encounter (Signed)
Patient advised. She doesn't want to go to the ER at this time. She is requesting a RX for Hydrocodone until she sees [facility] advisersurgeon on Monday. Maurine MinisterDennis then spoke with patient.

## 2018-02-11 NOTE — ED Notes (Signed)
Pt does not have a legal guardian. Called and verify with son Chrissie NoaWilliam and grand daughter who is at the bedside that pt does not have a guardian. Pt is A&O x 3. Documented in the system that pt does not have a legal guardian.

## 2018-02-12 ENCOUNTER — Telehealth: Payer: Self-pay

## 2018-02-12 NOTE — Telephone Encounter (Signed)
[facility]

## 2018-02-13 NOTE — Telephone Encounter (Signed)
Spoke with patient. She just wanted to call report of ER visit. Hailey Gibbs is aware.

## 2018-02-14 LAB — CULTURE, BLOOD (ROUTINE X 2)
CULTURE: NO GROWTH
Culture: NO GROWTH
Special Requests: ADEQUATE
Special Requests: ADEQUATE

## 2018-02-17 ENCOUNTER — Other Ambulatory Visit
Admission: RE | Admit: 2018-02-17 | Discharge: 2018-02-17 | Disposition: A | Discharge status: Home / Self Care | Payer: Medicare HMO | Source: Ambulatory Visit | Attending: Surgery | Admitting: Surgery

## 2018-02-17 ENCOUNTER — Encounter: Payer: Self-pay | Admitting: Surgery

## 2018-02-17 ENCOUNTER — Ambulatory Visit: Payer: Medicare HMO | Admitting: Surgery

## 2018-02-17 DIAGNOSIS — R1011 Right upper quadrant pain: Secondary | ICD-10-CM | POA: Diagnosis not present

## 2018-02-17 DIAGNOSIS — K8020 Calculus of gallbladder without cholecystitis without obstruction: Secondary | ICD-10-CM

## 2018-02-17 LAB — COMPREHENSIVE METABOLIC PANEL
ALBUMIN: 3.8 g/dL (ref 3.5–5.0)
ALK PHOS: 100 U/L (ref 38–126)
ALT: 13 U/L — ABNORMAL LOW (ref 14–54)
ANION GAP: 11 (ref 5–15)
AST: 17 U/L (ref 15–41)
BILIRUBIN TOTAL: 0.5 mg/dL (ref 0.3–1.2)
BUN: 9 mg/dL (ref 6–20)
CALCIUM: 9.3 mg/dL (ref 8.9–10.3)
CO2: 37 mmol/L — AB (ref 22–32)
Chloride: 90 mmol/L — ABNORMAL LOW (ref 101–111)
Creatinine, Ser: 0.53 mg/dL (ref 0.44–1.00)
GFR calc Af Amer: >60 mL/min (ref >60)
GFR calc non Af Amer: >60 mL/min (ref >60)
Glucose, Bld: 124 mg/dL — ABNORMAL HIGH (ref 65–99)
POTASSIUM: 3.4 mmol/L — AB (ref 3.5–5.1)
SODIUM: 138 mmol/L (ref 135–145)
TOTAL PROTEIN: 7.2 g/dL (ref 6.5–8.1)

## 2018-02-17 LAB — CBC WITH DIFFERENTIAL/PLATELET
BASOS ABS: 0.1 10*3/uL (ref 0–0.1)
BASOS PCT: 1 %
Eosinophils Absolute: 0.7 10*3/uL (ref 0–0.7)
Eosinophils Relative: 6 %
HEMATOCRIT: 36 % (ref 35.0–47.0)
HEMOGLOBIN: 11.6 g/dL — AB (ref 12.0–16.0)
Lymphocytes Relative: 18 %
Lymphs Abs: 2 10*3/uL (ref 1.0–3.6)
MCH: 29.5 pg (ref 26.0–34.0)
MCHC: 32.2 g/dL (ref 32.0–36.0)
MCV: 91.8 fL (ref 80.0–100.0)
Monocytes Absolute: 0.8 10*3/uL (ref 0.2–0.9)
Monocytes Relative: 7 %
NEUTROS ABS: 7.5 10*3/uL — AB (ref 1.4–6.5)
Neutrophils Relative %: 68 %
Platelets: 555 10*3/uL — ABNORMAL HIGH (ref 150–440)
RBC: 3.92 MIL/uL (ref 3.80–5.20)
RDW: 14.2 % (ref 11.5–14.5)
WBC: 11.1 10*3/uL — ABNORMAL HIGH (ref 3.6–11.0)

## 2018-02-17 MED ORDER — HYDROCODONE-ACETAMINOPHEN 5-325 MG PO TABS: 1.0000 | ORAL_TABLET | Freq: Four times a day (QID) | ORAL | 0 refills | Status: DC | PRN

## 2018-02-17 NOTE — Progress Notes (Signed)
Hailey Gibbs is an 72 y.o. female.   Chief Complaint: Right upper quadrant pain  Consult requested by emergency room physician  HPI: This a patient with severe COPD is being treated by her physician for a recent Nedra HaiLee discovered  Pneumonia.  A workup has shown gallstones without signs of acute cholecystitis. Patient describes 1 week of abdominal pain in the right upper quadrant often associated with fatty foods.  She describes it anywhere from a 2-10.  A workup showed no sign of acute cholecystitis.  No nausea or vomiting.  He is taking narcotics for the pain.  Also of note she is been treated for a pneumonia with a Z-Pak and prednisone that she finished at the end of last week.  She is on home oxygen at 2 L with a saturation level of 85% and continues to smoke 1 pack of cigarettes per day  Past Medical History:  Diagnosis Date  . Abnormal chest sounds 08/29/2015  . Acute respiratory failure (HCC)   . Anxiety   . BP (high blood pressure) 08/29/2015  . Carotid artery occlusion   . Carotid stenosis 06/23/2015  . COPD (chronic obstructive pulmonary disease) (HCC)   . COPD exacerbation (HCC) 10/26/2016  . Depression   . GERD (gastroesophageal reflux disease)   . Hyperlipidemia   . Hypertension   . Obstructive chronic bronchitis with exacerbation (HCC) 08/29/2015  . Palliative care by specialist   . Seasonal allergies   . Shortness of breath dyspnea     Past Surgical History:  Procedure Laterality Date  . ENDARTERECTOMY Left 06/23/2015   Procedure: ENDARTERECTOMY CAROTID;  Surgeon: Annice NeedyJason S Dew, MD;  Location: [facility] ORS;  Service: Vascular;  Laterality: Left;  . TUBAL LIGATION    . VAGINAL HYSTERECTOMY      Family History  Problem Relation Age of Onset  . Heart disease Father 2875       CABG   . Hyperlipidemia Father   . Pneumonia Father   . Dementia Mother   . COPD Mother   . Diabetes Sister   . Parkinson's disease Brother   . Post-traumatic stress disorder Son    Social  History:  reports that she has been smoking cigarettes.  She has a 22.50 pack-year smoking history. she has never used smokeless tobacco. She reports that she does not drink alcohol or use drugs.  Allergies:  Allergies  Allergen Reactions  . Codeine Anxiety          (Not in a hospital admission)   Review of Systems:   Review of Systems  Constitutional: Negative for chills and fever.  HENT: Negative.   Eyes: Negative.   Respiratory: Positive for cough, shortness of breath and wheezing. Negative for hemoptysis and sputum production.   Cardiovascular: Negative.   Gastrointestinal: Positive for abdominal pain. Negative for blood in stool, constipation, diarrhea, heartburn, nausea and vomiting.  Genitourinary: Negative.   Musculoskeletal: Negative.   Skin: Negative.   Neurological: Negative.   Endo/Heme/Allergies: Negative.   Psychiatric/Behavioral: Negative.     Physical Exam:  Physical Exam  Constitutional: She is oriented to person, place, and time. No distress.  On home oxygen generator.  Smells of smoke.  Wheelchair-bound.  Barrel chested Cannot complete sentences without taking a breath.  HENT:  Head: Normocephalic and atraumatic.  Eyes: Pupils are equal, round, and reactive to light. Right eye exhibits no discharge. Left eye exhibits no discharge. No scleral icterus.  Neck: Normal range of motion.  Cardiovascular: Normal rate, regular rhythm  and normal heart sounds.  Pulmonary/Chest: Effort normal. No respiratory distress. She has wheezes. She has no rales.  Abdominal: Soft. She exhibits no distension. There is no tenderness. There is no rebound and no guarding.  Minimal if any right upper quadrant pain and a negative Murphy sign  Musculoskeletal: Normal range of motion. She exhibits edema.  Lymphadenopathy:    She has no cervical adenopathy.  Neurological: She is alert and oriented to person, place, and time.  Skin: Skin is warm and dry. No rash noted. She is not  diaphoretic. No erythema.  Psychiatric: Mood and affect normal.  Vitals reviewed.   There were no vitals taken for this visit.    No results found for this or any previous visit (from the past 48 hour(s)). No results found.   Assessment/Plan Ultrasound shows stones without acute cholecystitis.  Laboratory values demonstrate no leukocytosis and normal liver function tests.  Her CO2 is 34 however.  These labs are drawn on 5 March have not been repeated.  We will repeat labs but I see no obvious sign of acute cholecystitis at this time.  She is taking narcotics for considerable abdominal pain but her exam is nearly benign.  I believe that her pulmonary status both from her chronic COPD, ongoing smoking, and recent pneumonia precludes the safe performance of a cholecystectomy.  She may benefit from a cholecystostomy tube but that would only be performed if she had acute cholecystitis.  We will recheck her liver function tests and white blood cell count today.  If grossly abnormal then cholecystostomy tube may be indicated but her exam is not suggesting that severity of illness.  Her biggest issue is her lungs and ongoing smoking and hypo-oxygenation.  General anesthetic in her situation is completely contraindicated in the absence of severe infection in which case cholecystostomy tube would would be indicated.  Lattie Haw, MD, FACS

## 2018-02-17 NOTE — Patient Instructions (Signed)
Please go to the lab and have your blood drawn done. We will call you with results and recommendations.

## 2018-02-18 ENCOUNTER — Telehealth: Payer: Self-pay

## 2018-02-18 ENCOUNTER — Encounter: Payer: Self-pay | Admitting: Family Medicine

## 2018-02-18 ENCOUNTER — Ambulatory Visit (INDEPENDENT_AMBULATORY_CARE_PROVIDER_SITE_OTHER): Payer: Medicare HMO | Admitting: Family Medicine

## 2018-02-18 VITALS — BP 120/64 | HR 104 | Temp 98.5°F | Resp 24

## 2018-02-18 DIAGNOSIS — R1011 Right upper quadrant pain: Secondary | ICD-10-CM

## 2018-02-18 DIAGNOSIS — E876 Hypokalemia: Secondary | ICD-10-CM

## 2018-02-18 DIAGNOSIS — J441 Chronic obstructive pulmonary disease with (acute) exacerbation: Secondary | ICD-10-CM | POA: Diagnosis not present

## 2018-02-18 MED ORDER — POTASSIUM CHLORIDE CRYS ER 20 MEQ PO TBCR: 20.0000 meq | EXTENDED_RELEASE_TABLET | Freq: Every day | ORAL | 3 refills | Status: DC

## 2018-02-18 NOTE — Progress Notes (Signed)
Patient: Hailey Gibbs Female    DOB: 11-13-1946   72 y.o.   MRN: [id number] Visit Date: 02/18/2018  Today's Provider: Dortha Kern, PA   No chief complaint on file.  Subjective:    HPI Patient presents today to discuss lab results. She reports that her appt was scheduled by [facility] today and she was advised to let her PCP go over the results. Feels well today with no other complaints.     Past Medical History:  Diagnosis Date  . Abnormal chest sounds 08/29/2015  . Acute respiratory failure (HCC)   . Anxiety   . BP (high blood pressure) 08/29/2015  . Carotid artery occlusion   . Carotid stenosis 06/23/2015  . COPD (chronic obstructive pulmonary disease) (HCC)   . COPD exacerbation (HCC) 10/26/2016  . Depression   . GERD (gastroesophageal reflux disease)   . Hyperlipidemia   . Hypertension   . Obstructive chronic bronchitis with exacerbation (HCC) 08/29/2015  . Palliative care by specialist   . Seasonal allergies   . Shortness of breath dyspnea    Patient Active Problem List   Diagnosis Date Noted  . Acute respiratory failure (HCC)   . Palliative care by specialist   . UTI (urinary tract infection) 01/17/2018  . Sepsis (HCC) 11/07/2016  . COPD exacerbation (HCC) 10/26/2016  . Abnormal chest sounds 08/29/2015  . Obstructive chronic bronchitis with exacerbation (HCC) 08/29/2015  . Anxiety and depression 08/29/2015  . BP (high blood pressure) 08/29/2015  . Carotid stenosis 06/23/2015  . DNR (do not resuscitate) discussion 05/30/2015  . Tobacco use 05/30/2015  . Hyperlipidemia    Past Surgical History:  Procedure Laterality Date  . ENDARTERECTOMY Left 06/23/2015   Procedure: ENDARTERECTOMY CAROTID;  Surgeon: Annice Needy, MD;  Location: [facility] ORS;  Service: Vascular;  Laterality: Left;  . TUBAL LIGATION    . VAGINAL HYSTERECTOMY     Family History  Problem Relation Age of Onset  . Heart disease Father 36       CABG   . Hyperlipidemia Father     . Pneumonia Father   . Dementia Mother   . COPD Mother   . Diabetes Sister   . Parkinson's disease Brother   . Post-traumatic stress disorder Son    Allergies  Allergen Reactions  . Codeine Anxiety         Current Outpatient Medications:  .  acetaminophen (TYLENOL) 500 MG tablet, Take 1,000 mg by mouth every 6 (six) hours as needed for mild pain or headache. , Disp: , Rfl:  .  albuterol (VENTOLIN HFA) 108 (90 Base) MCG/ACT inhaler, INHALE 1-2 PUFFS 4 TIMES A DAY AS NEEDED FOR WHEEZING, Disp: 18 Inhaler, Rfl: 2 .  amLODipine (NORVASC) 10 MG tablet, TAKE 1 TABLET (10 MG TOTAL) BY MOUTH DAILY., Disp: 90 tablet, Rfl: 3 .  aspirin 81 MG chewable tablet, Chew 81 mg by mouth at bedtime. , Disp: , Rfl:  .  atorvastatin (LIPITOR) 10 MG tablet, Take 1 tablet (10 mg total) by mouth daily., Disp: 90 tablet, Rfl: 3 .  azithromycin (ZITHROMAX Z-PAK) 250 MG tablet, Take 2 tablets (500 mg) on  Day 1,  followed by 1 tablet (250 mg) once daily on Days 2 through 5., Disp: 6 each, Rfl: 0 .  B Complex-C-Folic Acid (HM SUPER VITAMIN B COMPLEX/C PO), Take 1 tablet by mouth daily. , Disp: , Rfl:  .  calcium carbonate (OSCAL) 1500 (600 Ca) MG TABS tablet, Take  600 mg of elemental calcium by mouth daily with breakfast., Disp: , Rfl:  .  cetirizine (ZYRTEC) 10 MG tablet, Take 10 mg by mouth at bedtime. , Disp: , Rfl:  .  Cholecalciferol (VITAMIN D3) 2000 units TABS, Take 2,000 Units by mouth daily. , Disp: , Rfl:  .  ferrous sulfate 325 (65 FE) MG tablet, Take 325 mg by mouth daily with breakfast., Disp: , Rfl:  .  HYDROcodone-acetaminophen (NORCO/VICODIN) 5-325 MG tablet, Take 1 tablet by mouth every 6 (six) hours as needed for moderate pain., Disp: 20 tablet, Rfl: 0 .  HYDROcodone-acetaminophen (NORCO/VICODIN) 5-325 MG tablet, Take 1 tablet by mouth every 6 (six) hours as needed for moderate pain., Disp: 15 tablet, Rfl: 0 .  Multiple Vitamin (MULTIVITAMIN) tablet, Take 1 tablet by mouth at bedtime. , Disp: ,  Rfl:  .  nitrofurantoin, macrocrystal-monohydrate, (MACROBID) 100 MG capsule, Take 1 capsule (100 mg total) by mouth 2 (two) times daily. (Patient not taking: Reported on 02/09/2018), Disp: 20 capsule, Rfl: 0 .  Probiotic Product (PROBIOTIC DAILY PO), Take by mouth., Disp: , Rfl:  .  sertraline (ZOLOFT) 100 MG tablet, 1 (ONE) TABLET, ORAL, AT BEDTIME DAILY BY MOUTH, Disp: 90 tablet, Rfl: 3 .  SPIRIVA HANDIHALER 18 MCG inhalation capsule, INHALE 1 CAPSULE ONCE A DAY, Disp: 30 capsule, Rfl: 11 .  SYMBICORT 160-4.5 MCG/ACT inhaler, INHALE 2 PUFFS INTO THE LUNGS 2 (TWO) TIMES DAILY., Disp: 10.2 Inhaler, Rfl: 3 .  traMADol (ULTRAM) 50 MG tablet, Take 1 tablet (50 mg total) by mouth every 8 (eight) hours as needed., Disp: 30 tablet, Rfl: 0 .  vitamin E 400 UNIT capsule, Take 400 Units by mouth daily., Disp: , Rfl:   Review of Systems  Constitutional: Negative.   HENT: Negative.   Eyes: Negative.   Respiratory: Positive for shortness of breath and wheezing.   Cardiovascular: Negative for chest pain and leg swelling.  Gastrointestinal: Positive for abdominal pain.    Social History   Tobacco Use  . Smoking status: Current Every Day Smoker    Packs/day: 0.50    Years: 45.00    Pack years: 22.50    Types: Cigarettes  . Smokeless tobacco: Never Used  . Tobacco comment: pt uses nictine patches occasioanlly   Substance Use Topics  . Alcohol use: No    Alcohol/week: 0.0 oz   Objective:   BP 120/64   Pulse (!) 104   Temp 98.5 F (36.9 C)   Resp (!) 24   SpO2 95% Comment: with 2.5L of O2  Physical Exam  Constitutional: She is oriented to person, place, and time. She appears well-developed and well-nourished. No distress.  HENT:  Head: Normocephalic and atraumatic.  Right Ear: Hearing normal.  Left Ear: Hearing normal.  Nose: Nose normal.  Eyes: Conjunctivae and lids are normal. Right eye exhibits no discharge. Left eye exhibits no discharge. No scleral icterus.  Cardiovascular: Normal  rate.  Pulmonary/Chest: She is in respiratory distress. She has wheezes. She has no rales.  Distant breath sounds.  Abdominal: Soft. She exhibits no distension and no mass. There is tenderness.  RUQ tenderness to deep palpation. BS are present but quiet. No CVA tenderness to percussion posteriorly. No pain to percuss dorsal spinous processed from thoracic to lumbar region. No rashes noted in the flank.  Musculoskeletal: Normal range of motion.  Neurological: She is alert and oriented to person, place, and time.  Skin: Skin is intact. No lesion and no rash noted.  Psychiatric: She  has a normal mood and affect. Her speech is normal and behavior is normal. Thought content normal.      Assessment & Plan:     1. Hypokalemia Had recheck of labs by Dr. Excell Seltzerooper (surgeon) yesterday and found K+ was 3.4. Will give supplement and encouraged to drink plenty of fluids. Due to gallstones, encouraged to get on a low fat diet with more liquids. Should use stool softener or Miralax if any constipation from narcotic analgesics. Recheck in a week. - potassium chloride SA (K-DUR,KLOR-CON) 20 MEQ tablet; Take 1 tablet (20 mEq total) by mouth daily.  Dispense: 30 tablet; Refill: 3  2. RUQ pain Had evaluation of RUQ pain (with gallstones on ultrasound of 02-10-18) by Dr. Excell Seltzerooper (surgeon) yesterday after ER visit on 02-11-18. Finds the use of Vicodin helps but jarring and eating fatty foods will cause sharper pains. Dr. Excell Seltzerooper felt she was a very poor surgical candidate due to her severe COPD with hypoxia (pulse ox dropped to 72% when away from home on her portable oxygen concentrator in the ER on 02-11-18). Lipase on 02-11-18 was 33 with normal liver enzymes. WBC count was 11,100. He offered cholecystostomy tube if no improvement soon since her ultrasound did not show any cholecystitis.States she is not in much pain at the present but feels she may have a little constipation. Treat as above.  3. COPD exacerbation  (HCC) Continues to have dyspnea and some wheezes occasionally. Still using Symbicort 160-4.5 mcg/ACT 2 puffs BID, Spiriva 18 mcg 1 capsule by handihaler and Ventolin-HFA prn rescue with 2.5 LPM of oxygen by nasal cannula. Finds breathing and O2 saturation levels are better when she uses the oxygen bottles with continuous flow versus the portable concentrator with pulse flow when away from home. The large concentrator at home works well and gives a continuous flow, also. She will ask [employer] if a rolling O2 tank carrier can be supplied for medical appointments. Needs to stop all smoking/tobacco use.       Dortha Kernennis Jamilett Ferrante, PA  [facility]

## 2018-02-18 NOTE — Patient Instructions (Signed)
Low-Fat Diet for Pancreatitis or Gallbladder Conditions A low-fat diet can be helpful if you have pancreatitis or a gallbladder condition. With these conditions, your pancreas and gallbladder have trouble digesting fats. A healthy eating plan with less fat will help rest your pancreas and gallbladder and reduce your symptoms. What do I need to know about this diet?  Eat a low-fat diet. ? Reduce your fat intake to less than 20-30% of your total daily calories. This is less than 50-60 g of fat per day. ? Remember that you need some fat in your diet. Ask your dietician what your daily goal should be. ? Choose nonfat and low-fat healthy foods. Look for the words "nonfat," "low fat," or "fat free." ? As a guide, look on the label and choose foods with less than 3 g of fat per serving. Eat only one serving.  Avoid alcohol.  Do not smoke. If you need help quitting, talk with your health care provider.  Eat small frequent meals instead of three large heavy meals. What foods can I eat? Grains Include healthy grains and starches such as potatoes, wheat bread, fiber-rich cereal, and brown rice. Choose whole grain options whenever possible. In adults, whole grains should account for 45-65% of your daily calories. Fruits and Vegetables Eat plenty of fruits and vegetables. Fresh fruits and vegetables add fiber to your diet. Meats and Other Protein Sources Eat lean meat such as chicken and pork. Trim any fat off of meat before cooking it. Eggs, fish, and beans are other sources of protein. In adults, these foods should account for 10-35% of your daily calories. Dairy Choose low-fat milk and dairy options. Dairy includes fat and protein, as well as calcium. Fats and Oils Limit high-fat foods such as fried foods, sweets, baked goods, sugary drinks. Other Creamy sauces and condiments, such as mayonnaise, can add extra fat. Think about whether or not you need to use them, or use smaller amounts or low fat  options. What foods are not recommended?  High fat foods, such as: ? [employer]. ? Ice cream. ? [place] toast. ? Sweet rolls. ? Pizza. ? Cheese bread. ? Foods covered with batter, butter, creamy sauces, or cheese. ? Fried foods. ? Sugary drinks and desserts.  Foods that cause gas or bloating This information is not intended to replace advice given to you by your health care provider. Make sure you discuss any questions you have with your health care provider. Document Released: 12/01/2013 Document Revised: 05/03/2016 Document Reviewed: 11/09/2013 Elsevier Interactive Patient Education  2017 Elsevier Inc. Cholelithiasis Cholelithiasis is a form of gallbladder disease in which gallstones form in the gallbladder. The gallbladder is an organ that stores bile. Bile is made in the liver, and it helps to digest fats. Gallstones begin as small crystals and slowly grow into stones. They may cause no symptoms until the gallbladder tightens (contracts) and a gallstone is blocking the duct (gallbladder attack), which can cause pain. Cholelithiasis is also referred to as gallstones. There are two main types of gallstones:  Cholesterol stones. These are made of hardened cholesterol and are usually yellow-green in color. They are the most common type of gallstone. Cholesterol is a white, waxy, fat-like substance that is made in the liver.  Pigment stones. These are dark in color and are made of a red-yellow substance that forms when hemoglobin from red blood cells breaks down (bilirubin).  What are the causes? This condition may be caused by an imbalance in the substances that bile  is made of. This can happen if the bile:  Has too much bilirubin.  Has too much cholesterol.  Does not have enough bile salts. These salts help the body absorb and digest fats.  In some cases, this condition can also be caused by the gallbladder not emptying completely or often enough. What increases the risk? The  following factors may make you more likely to develop this condition:  Being female.  Having multiple pregnancies. Health care providers sometimes advise removing diseased gallbladders before future pregnancies.  Eating a diet that is heavy in fried foods, fat, and refined carbohydrates, like white bread and white rice.  Being obese.  Being older than age 71.  Prolonged use of medicines that contain female hormones (estrogen).  Having diabetes mellitus.  Rapidly losing weight.  Having a family history of gallstones.  Being of American [place] or [place] descent.  Having an intestinal disease such as Crohn disease.  Having metabolic syndrome.  Having cirrhosis.  Having severe types of anemia such as sickle cell anemia.  What are the signs or symptoms? In most cases, there are no symptoms. These are known as silent gallstones. If a gallstone blocks the bile ducts, it can cause a gallbladder attack. The main symptom of a gallbladder attack is sudden pain in the upper right abdomen. The pain usually comes at night or after eating a large meal. The pain can last for one or several hours and can spread to the right shoulder or chest. If the bile duct is blocked for more than a few hours, it can cause infection or inflammation of the gallbladder, liver, or pancreas, which may cause:  Nausea.  Vomiting.  Abdominal pain that lasts for 5 hours or more.  Fever or chills.  Yellowing of the skin or the whites of the eyes (jaundice).  Dark urine.  Light-colored stools.  How is this diagnosed? This condition may be diagnosed based on:  A physical exam.  Your medical history.  An ultrasound of your gallbladder.  CT scan.  MRI.  Blood tests to check for signs of infection or inflammation.  A scan of your gallbladder and bile ducts (biliary system) using nonharmful radioactive material and special cameras that can see the radioactive material (cholescintigram). This test  checks to see how your gallbladder contracts and whether bile ducts are blocked.  Inserting a small tube with a camera on the end (endoscope) through your mouth to inspect bile ducts and check for blockages (endoscopic retrograde cholangiopancreatogram).  How is this treated? Treatment for gallstones depends on the severity of the condition. Silent gallstones do not need treatment. If the gallstones cause a gallbladder attack or other symptoms, treatment may be required. Options for treatment include:  Surgery to remove the gallbladder (cholecystectomy). This is the most common treatment.  Medicines to dissolve gallstones. These are most effective at treating small gallstones. You may need to take medicines for up to 6-12 months.  Shock wave treatment (extracorporeal biliary lithotripsy). In this treatment, an ultrasound machine sends shock waves to the gallbladder to break gallstones into smaller pieces. These pieces can then be passed into the intestines or be dissolved by medicine. This is rarely used.  Removing gallstones through endoscopic retrograde cholangiopancreatogram. A small basket can be attached to the endoscope and used to capture and remove gallstones.  Follow these instructions at home:  Take over-the-counter and prescription medicines only as told by your health care provider.  Maintain a healthy weight and follow a healthy diet.  This includes: ? Reducing fatty foods, such as fried food. ? Reducing refined carbohydrates, like white bread and white rice. ? Increasing fiber. Aim for foods like almonds, fruit, and beans.  Keep all follow-up visits as told by your health care provider. This is important. Contact a health care provider if:  You think you have had a gallbladder attack.  You have been diagnosed with silent gallstones and you develop abdominal pain or indigestion. Get help right away if:  You have pain from a gallbladder attack that lasts for more than 2  hours.  You have abdominal pain that lasts for more than 5 hours.  You have a fever or chills.  You have persistent nausea and vomiting.  You develop jaundice.  You have dark urine or light-colored stools. Summary  Cholelithiasis (also called gallstones) is a form of gallbladder disease in which gallstones form in the gallbladder.  This condition is caused by an imbalance in the substances that make up bile. This can happen if the bile has too much cholesterol, too much bilirubin, or not enough bile salts.  You are more likely to develop this condition if you are female, pregnant, using medicines with estrogen, obese, older than age 44, or have a family history of gallstones. You may also develop gallstones if you have diabetes, an intestinal disease, cirrhosis, or metabolic syndrome.  Treatment for gallstones depends on the severity of the condition. Silent gallstones do not need treatment.  If gallstones cause a gallbladder attack or other symptoms, treatment may be needed. The most common treatment is surgery to remove the gallbladder. This information is not intended to replace advice given to you by your health care provider. Make sure you discuss any questions you have with your health care provider. Document Released: 11/22/2005 Document Revised: 08/12/2016 Document Reviewed: 08/12/2016 Elsevier Interactive Patient Education  2018 [employer]. Hypokalemia Hypokalemia means that the amount of potassium in the blood is lower than normal.Potassium is a chemical that helps regulate the amount of fluid in the body (electrolyte). It also stimulates muscle tightening (contraction) and helps nerves work properly.Normally, most of the body's potassium is inside of cells, and only a very small amount is in the blood. Because the amount in the blood is so small, minor changes to potassium levels in the blood can be life-threatening. What are the causes? This condition may be caused  by:  Antibiotic medicine.  Diarrhea or vomiting. Taking too much of a medicine that helps you have a bowel movement (laxative) can cause diarrhea and lead to hypokalemia.  Chronic kidney disease (CKD).  Medicines that help the body get rid of excess fluid (diuretics).  Eating disorders, such as bulimia.  Low magnesium levels in the body.  Sweating a lot.  What are the signs or symptoms? Symptoms of this condition include:  Weakness.  Constipation.  Fatigue.  Muscle cramps.  Mental confusion.  Skipped heartbeats or irregular heartbeat (palpitations).  Tingling or numbness.  How is this diagnosed? This condition is diagnosed with a blood test. How is this treated? Hypokalemia can be treated by taking potassium supplements by mouth or adjusting the medicines that you take. Treatment may also include eating more foods that contain a lot of potassium. If your potassium level is very low, you may need to get potassium through an IV tube in one of your veins and be monitored in the hospital. Follow these instructions at home:  Take over-the-counter and prescription medicines only as told by your health care  provider. This includes vitamins and supplements.  Eat a healthy diet. A healthy diet includes fresh fruits and vegetables, whole grains, healthy fats, and lean proteins.  If instructed, eat more foods that contain a lot of potassium, such as: ? Nuts, such as peanuts and pistachios. ? Seeds, such as sunflower seeds and pumpkin seeds. ? Peas, lentils, and lima beans. ? Whole grain and bran cereals and breads. ? Fresh fruits and vegetables, such as apricots, avocado, bananas, cantaloupe, kiwi, oranges, tomatoes, asparagus, and potatoes. ? Orange juice. ? Tomato juice. ? Red meats. ? Yogurt.  Keep all follow-up visits as told by your health care provider. This is important. Contact a health care provider if:  You have weakness that gets worse.  You feel your heart  pounding or racing.  You vomit.  You have diarrhea.  You have diabetes (diabetes mellitus) and you have trouble keeping your blood sugar (glucose) in your target range. Get help right away if:  You have chest pain.  You have shortness of breath.  You have vomiting or diarrhea that lasts for more than 2 days.  You faint. This information is not intended to replace advice given to you by your health care provider. Make sure you discuss any questions you have with your health care provider. Document Released: 11/26/2005 Document Revised: 07/14/2016 Document Reviewed: 07/14/2016 Elsevier Interactive Patient Education  2018 [employer].

## 2018-02-18 NOTE — Telephone Encounter (Signed)
Called patient to let her know that her labs were abnormal, therefore, Dr. Excell Seltzerooper wanted her to see her PCP. I went ahead and called her PCP's office and scheduled her an appointment. It will be today at 2:00 PM. I told patient about her appointment and I told her that she needs to show up if not it will count as a no show. Patient understood and stated that she will go and see him.

## 2018-02-20 DIAGNOSIS — J449 Chronic obstructive pulmonary disease, unspecified: Secondary | ICD-10-CM | POA: Diagnosis not present

## 2018-02-24 ENCOUNTER — Encounter: Payer: Self-pay | Admitting: Family Medicine

## 2018-02-24 ENCOUNTER — Ambulatory Visit (INDEPENDENT_AMBULATORY_CARE_PROVIDER_SITE_OTHER): Payer: Medicare HMO | Admitting: Family Medicine

## 2018-02-24 VITALS — BP 90/60 | HR 93 | Temp 98.9°F

## 2018-02-24 DIAGNOSIS — E876 Hypokalemia: Secondary | ICD-10-CM

## 2018-02-24 DIAGNOSIS — K8021 Calculus of gallbladder without cholecystitis with obstruction: Secondary | ICD-10-CM | POA: Diagnosis not present

## 2018-02-24 MED ORDER — HYDROCODONE-ACETAMINOPHEN 5-325 MG PO TABS: 1.0000 | ORAL_TABLET | Freq: Four times a day (QID) | ORAL | 0 refills | Status: DC | PRN

## 2018-02-24 NOTE — Progress Notes (Signed)
Patient: Hailey Gibbs Female    DOB: 1946/03/02   72 y.o.   MRN: [id number] Visit Date: 02/24/2018  Today's Provider: Dortha Kernennis Chrismon, PA   Chief Complaint  Patient presents with  . Abnormal lab follow up   Subjective:    HPI Hypokalemia & RUQ Pain: Patient presents for a 1 week follow up. Last OV was on 02/18/18. Patient encouraged to drink plenty of fluids. Due to gallstones, encouraged patient to get on a low fat diet with more liquids and use a stool softener or Miralax if any constipation from narcotic analgesics occur. Recheck labs in 1 week. Patient reports good compliance with treatment plan. She states RUQ pain is unchanged at times. She states the pain is aggravated by certain foods.    Past Medical History:  Diagnosis Date  . Abnormal chest sounds 08/29/2015  . Acute respiratory failure (HCC)   . Anxiety   . BP (high blood pressure) 08/29/2015  . Carotid artery occlusion   . Carotid stenosis 06/23/2015  . COPD (chronic obstructive pulmonary disease) (HCC)   . COPD exacerbation (HCC) 10/26/2016  . Depression   . GERD (gastroesophageal reflux disease)   . Hyperlipidemia   . Hypertension   . Obstructive chronic bronchitis with exacerbation (HCC) 08/29/2015  . Palliative care by specialist   . Seasonal allergies   . Shortness of breath dyspnea    Past Surgical History:  Procedure Laterality Date  . ENDARTERECTOMY Left 06/23/2015   Procedure: ENDARTERECTOMY CAROTID;  Surgeon: Annice NeedyJason S Dew, MD;  Location: [facility] ORS;  Service: Vascular;  Laterality: Left;  . TUBAL LIGATION    . VAGINAL HYSTERECTOMY     Family History  Problem Relation Age of Onset  . Heart disease Father 975       CABG   . Hyperlipidemia Father   . Pneumonia Father   . Dementia Mother   . COPD Mother   . Diabetes Sister   . Parkinson's disease Brother   . Post-traumatic stress disorder Son    Allergies  Allergen Reactions  . Codeine Anxiety         Current Outpatient Medications:    .  acetaminophen (TYLENOL) 500 MG tablet, Take 1,000 mg by mouth every 6 (six) hours as needed for mild pain or headache. , Disp: , Rfl:  .  albuterol (VENTOLIN HFA) 108 (90 Base) MCG/ACT inhaler, INHALE 1-2 PUFFS 4 TIMES A DAY AS NEEDED FOR WHEEZING, Disp: 18 Inhaler, Rfl: 2 .  amLODipine (NORVASC) 10 MG tablet, TAKE 1 TABLET (10 MG TOTAL) BY MOUTH DAILY., Disp: 90 tablet, Rfl: 3 .  aspirin 81 MG chewable tablet, Chew 81 mg by mouth at bedtime. , Disp: , Rfl:  .  atorvastatin (LIPITOR) 10 MG tablet, Take 1 tablet (10 mg total) by mouth daily., Disp: 90 tablet, Rfl: 3 .  B Complex-C-Folic Acid (HM SUPER VITAMIN B COMPLEX/C PO), Take 1 tablet by mouth daily. , Disp: , Rfl:  .  calcium carbonate (OSCAL) 1500 (600 Ca) MG TABS tablet, Take 600 mg of elemental calcium by mouth daily with breakfast., Disp: , Rfl:  .  cetirizine (ZYRTEC) 10 MG tablet, Take 10 mg by mouth at bedtime. , Disp: , Rfl:  .  Cholecalciferol (VITAMIN D3) 2000 units TABS, Take 2,000 Units by mouth daily. , Disp: , Rfl:  .  ferrous sulfate 325 (65 FE) MG tablet, Take 325 mg by mouth daily with breakfast., Disp: , Rfl:  .  HYDROcodone-acetaminophen (NORCO/VICODIN)  5-325 MG tablet, Take 1 tablet by mouth every 6 (six) hours as needed for moderate pain., Disp: 15 tablet, Rfl: 0 .  Multiple Vitamin (MULTIVITAMIN) tablet, Take 1 tablet by mouth at bedtime. , Disp: , Rfl:  .  potassium chloride SA (K-DUR,KLOR-CON) 20 MEQ tablet, Take 1 tablet (20 mEq total) by mouth daily., Disp: 30 tablet, Rfl: 3 .  Probiotic Product (PROBIOTIC DAILY PO), Take by mouth., Disp: , Rfl:  .  sertraline (ZOLOFT) 100 MG tablet, 1 (ONE) TABLET, ORAL, AT BEDTIME DAILY BY MOUTH, Disp: 90 tablet, Rfl: 3 .  SPIRIVA HANDIHALER 18 MCG inhalation capsule, INHALE 1 CAPSULE ONCE A DAY, Disp: 30 capsule, Rfl: 11 .  SYMBICORT 160-4.5 MCG/ACT inhaler, INHALE 2 PUFFS INTO THE LUNGS 2 (TWO) TIMES DAILY., Disp: 10.2 Inhaler, Rfl: 3 .  traMADol (ULTRAM) 50 MG tablet, Take 1  tablet (50 mg total) by mouth every 8 (eight) hours as needed., Disp: 30 tablet, Rfl: 0 .  vitamin E 400 UNIT capsule, Take 400 Units by mouth daily., Disp: , Rfl:   Review of Systems  Social History   Tobacco Use  . Smoking status: Current Every Day Smoker    Packs/day: 0.50    Years: 45.00    Pack years: 22.50    Types: Cigarettes  . Smokeless tobacco: Never Used  . Tobacco comment: pt uses nictine patches occasioanlly   Substance Use Topics  . Alcohol use: No    Alcohol/week: 0.0 oz   Objective:   BP 90/60 (BP Location: Right Arm, Patient Position: Sitting, Cuff Size: Normal)   Pulse 93   Temp 98.9 F (37.2 C) (Oral)   SpO2 93%    Physical Exam  Constitutional: She is oriented to person, place, and time. She appears well-developed and well-nourished. No distress.  HENT:  Head: Normocephalic and atraumatic.  Right Ear: Hearing normal.  Left Ear: Hearing normal.  Nose: Nose normal.  Eyes: Conjunctivae and lids are normal. Right eye exhibits no discharge. Left eye exhibits no discharge. No scleral icterus.  Cardiovascular: Normal rate.  Pulmonary/Chest: No respiratory distress.  Distant breath sounds. Slight dyspnea with constant use of oxygen by nasal cannula.  Abdominal: Soft. Bowel sounds are normal. There is tenderness.  Mild soreness in RUQ with history of gallstones.  Musculoskeletal: Normal range of motion.  Neurological: She is alert and oriented to person, place, and time.  Skin: Skin is intact. No lesion and no rash noted.  Psychiatric: She has a normal mood and affect. Her speech is normal and behavior is normal. Thought content normal.      Assessment & Plan:     1. Hypokalemia Slightly low potassium on 02-16-18 when in the ER. Will recheck blood level today. Still taking KCL 20 meq qd and trying to drink more fluids. Recheck pending lab reports. No muscle cramps, chest pains or palpitations. - Comprehensive metabolic panel  2. Calculus of gallbladder  with biliary obstruction but without cholecystitis Still has some RUQ discomfort intermittently. No nausea but appetite is diminished. BM's have become soft and daily. Encouraged to increase fluid intake and stick with the low fat diet. Recheck labs to assess hydration and any signs of infection. Refilled Norco that she will use 1-2 times a day prn. Recheck pending lab reports. - CBC with Differential/Platelet - Comprehensive metabolic panel - HYDROcodone-acetaminophen (NORCO/VICODIN) 5-325 MG tablet; Take 1 tablet by mouth every 6 (six) hours as needed for moderate pain.  Dispense: 20 tablet; Refill: 0  Vernie Murders, PA  [facility]

## 2018-02-25 ENCOUNTER — Ambulatory Visit: Payer: Self-pay | Admitting: Family Medicine

## 2018-02-25 LAB — COMPREHENSIVE METABOLIC PANEL
A/G RATIO: 1.7 (ref 1.2–2.2)
ALBUMIN: 4 g/dL (ref 3.5–4.8)
ALK PHOS: 127 IU/L — AB (ref 39–117)
ALT: 12 IU/L (ref 0–32)
AST: 16 IU/L (ref 0–40)
BUN/Creatinine Ratio: 12 (ref 12–28)
BUN: 7 mg/dL — ABNORMAL LOW (ref 8–27)
Bilirubin Total: <0.2 mg/dL (ref 0.0–1.2)
CO2: 32 mmol/L — ABNORMAL HIGH (ref 20–29)
Calcium: 10.1 mg/dL (ref 8.7–10.3)
Chloride: 91 mmol/L — ABNORMAL LOW (ref 96–106)
Creatinine, Ser: 0.6 mg/dL (ref 0.57–1.00)
GFR calc non Af Amer: 93 mL/min/{1.73_m2} (ref >59)
GFR, EST AFRICAN AMERICAN: 107 mL/min/{1.73_m2} (ref >59)
GLOBULIN, TOTAL: 2.4 g/dL (ref 1.5–4.5)
Glucose: 95 mg/dL (ref 65–99)
POTASSIUM: 4.7 mmol/L (ref 3.5–5.2)
SODIUM: 140 mmol/L (ref 134–144)
TOTAL PROTEIN: 6.4 g/dL (ref 6.0–8.5)

## 2018-02-25 LAB — CBC WITH DIFFERENTIAL/PLATELET
BASOS: 1 %
Basophils Absolute: 0.1 10*3/uL (ref 0.0–0.2)
EOS (ABSOLUTE): 0.5 10*3/uL — ABNORMAL HIGH (ref 0.0–0.4)
Eos: 4 %
HEMOGLOBIN: 11.1 g/dL (ref 11.1–15.9)
Hematocrit: 36.1 % (ref 34.0–46.6)
IMMATURE GRANS (ABS): 0 10*3/uL (ref 0.0–0.1)
Immature Granulocytes: 0 %
LYMPHS ABS: 1.9 10*3/uL (ref 0.7–3.1)
Lymphs: 17 %
MCH: 29.3 pg (ref 26.6–33.0)
MCHC: 30.7 g/dL — AB (ref 31.5–35.7)
MCV: 95 fL (ref 79–97)
MONOCYTES: 9 %
Monocytes Absolute: 0.9 10*3/uL (ref 0.1–0.9)
NEUTROS ABS: 7.6 10*3/uL — AB (ref 1.4–7.0)
Neutrophils: 69 %
Platelets: 461 10*3/uL — ABNORMAL HIGH (ref 150–379)
RBC: 3.79 x10E6/uL (ref 3.77–5.28)
RDW: 13.9 % (ref 12.3–15.4)
WBC: 11 10*3/uL — ABNORMAL HIGH (ref 3.4–10.8)

## 2018-02-28 DIAGNOSIS — J449 Chronic obstructive pulmonary disease, unspecified: Secondary | ICD-10-CM | POA: Diagnosis not present

## 2018-03-04 ENCOUNTER — Ambulatory Visit: Payer: Medicare HMO | Admitting: Family Medicine

## 2018-03-14 DIAGNOSIS — S52612A Displaced fracture of left ulna styloid process, initial encounter for closed fracture: Secondary | ICD-10-CM | POA: Diagnosis not present

## 2018-03-14 DIAGNOSIS — E7800 Pure hypercholesterolemia, unspecified: Secondary | ICD-10-CM | POA: Diagnosis not present

## 2018-03-14 DIAGNOSIS — S61552A Open bite of left wrist, initial encounter: Secondary | ICD-10-CM | POA: Diagnosis not present

## 2018-03-14 DIAGNOSIS — M25532 Pain in left wrist: Secondary | ICD-10-CM | POA: Diagnosis not present

## 2018-03-14 DIAGNOSIS — W540XXA Bitten by dog, initial encounter: Secondary | ICD-10-CM | POA: Diagnosis not present

## 2018-03-14 DIAGNOSIS — Z7951 Long term (current) use of inhaled steroids: Secondary | ICD-10-CM | POA: Diagnosis not present

## 2018-03-14 DIAGNOSIS — M79642 Pain in left hand: Secondary | ICD-10-CM | POA: Diagnosis not present

## 2018-03-14 DIAGNOSIS — J449 Chronic obstructive pulmonary disease, unspecified: Secondary | ICD-10-CM | POA: Diagnosis not present

## 2018-03-14 DIAGNOSIS — S61452A Open bite of left hand, initial encounter: Secondary | ICD-10-CM | POA: Diagnosis not present

## 2018-03-14 DIAGNOSIS — I10 Essential (primary) hypertension: Secondary | ICD-10-CM | POA: Diagnosis not present

## 2018-03-14 DIAGNOSIS — S61422A Laceration with foreign body of left hand, initial encounter: Secondary | ICD-10-CM | POA: Diagnosis not present

## 2018-03-14 DIAGNOSIS — S79912A Unspecified injury of left hip, initial encounter: Secondary | ICD-10-CM | POA: Diagnosis not present

## 2018-03-14 DIAGNOSIS — M25552 Pain in left hip: Secondary | ICD-10-CM | POA: Diagnosis not present

## 2018-03-17 ENCOUNTER — Encounter: Payer: Self-pay | Admitting: Family Medicine

## 2018-03-17 ENCOUNTER — Ambulatory Visit (INDEPENDENT_AMBULATORY_CARE_PROVIDER_SITE_OTHER): Payer: Medicare HMO | Admitting: Family Medicine

## 2018-03-17 VITALS — BP 110/68 | HR 112 | Temp 98.2°F | Resp 20 | Ht 64.0 in

## 2018-03-17 DIAGNOSIS — W540XXD Bitten by dog, subsequent encounter: Secondary | ICD-10-CM | POA: Diagnosis not present

## 2018-03-17 DIAGNOSIS — S61452D Open bite of left hand, subsequent encounter: Secondary | ICD-10-CM | POA: Diagnosis not present

## 2018-03-17 DIAGNOSIS — Z8639 Personal history of other endocrine, nutritional and metabolic disease: Secondary | ICD-10-CM

## 2018-03-17 DIAGNOSIS — K8020 Calculus of gallbladder without cholecystitis without obstruction: Secondary | ICD-10-CM | POA: Diagnosis not present

## 2018-03-17 DIAGNOSIS — K8021 Calculus of gallbladder without cholecystitis with obstruction: Secondary | ICD-10-CM

## 2018-03-17 MED ORDER — HYDROCODONE-ACETAMINOPHEN 5-325 MG PO TABS: 1.0000 | ORAL_TABLET | Freq: Four times a day (QID) | ORAL | 0 refills | Status: DC | PRN

## 2018-03-17 NOTE — Progress Notes (Addendum)
Patient: Hailey Gibbs Female    DOB: 1946-02-27   72 y.o.   MRN: [id number] Visit Date: 03/17/2018  Today's Provider: Dortha Kern, PA   Chief Complaint  Patient presents with  . Hypokalemia   Subjective:    HPI Hypokalemia, follow up: Patient was last seen in the office on 02/24/18. Patient was advised to continue KCL daily. Patient reports that she has tolerated the medication so far.     Past Medical History:  Diagnosis Date  . Abnormal chest sounds 08/29/2015  . Acute respiratory failure (HCC)   . Anxiety   . BP (high blood pressure) 08/29/2015  . Carotid artery occlusion   . Carotid stenosis 06/23/2015  . COPD (chronic obstructive pulmonary disease) (HCC)   . COPD exacerbation (HCC) 10/26/2016  . Depression   . GERD (gastroesophageal reflux disease)   . Hyperlipidemia   . Hypertension   . Obstructive chronic bronchitis with exacerbation (HCC) 08/29/2015  . Palliative care by specialist   . Seasonal allergies   . Shortness of breath dyspnea    Past Surgical History:  Procedure Laterality Date  . ENDARTERECTOMY Left 06/23/2015   Procedure: ENDARTERECTOMY CAROTID;  Surgeon: Annice Needy, MD;  Location: [facility] ORS;  Service: Vascular;  Laterality: Left;  . TUBAL LIGATION    . VAGINAL HYSTERECTOMY     Family History  Problem Relation Age of Onset  . Heart disease Father 79       CABG   . Hyperlipidemia Father   . Pneumonia Father   . Dementia Mother   . COPD Mother   . Diabetes Sister   . Parkinson's disease Brother   . Post-traumatic stress disorder Son    Allergies  Allergen Reactions  . Codeine Anxiety         Current Outpatient Medications:  .  acetaminophen (TYLENOL) 500 MG tablet, Take 1,000 mg by mouth every 6 (six) hours as needed for mild pain or headache. , Disp: , Rfl:  .  albuterol (VENTOLIN HFA) 108 (90 Base) MCG/ACT inhaler, INHALE 1-2 PUFFS 4 TIMES A DAY AS NEEDED FOR WHEEZING, Disp: 18 Inhaler, Rfl: 2 .  amLODipine (NORVASC)  10 MG tablet, TAKE 1 TABLET (10 MG TOTAL) BY MOUTH DAILY., Disp: 90 tablet, Rfl: 3 .  aspirin 81 MG chewable tablet, Chew 81 mg by mouth at bedtime. , Disp: , Rfl:  .  atorvastatin (LIPITOR) 10 MG tablet, Take 1 tablet (10 mg total) by mouth daily., Disp: 90 tablet, Rfl: 3 .  B Complex-C-Folic Acid (HM SUPER VITAMIN B COMPLEX/C PO), Take 1 tablet by mouth daily. , Disp: , Rfl:  .  calcium carbonate (OSCAL) 1500 (600 Ca) MG TABS tablet, Take 600 mg of elemental calcium by mouth daily with breakfast., Disp: , Rfl:  .  cetirizine (ZYRTEC) 10 MG tablet, Take 10 mg by mouth at bedtime. , Disp: , Rfl:  .  Cholecalciferol (VITAMIN D3) 2000 units TABS, Take 2,000 Units by mouth daily. , Disp: , Rfl:  .  ferrous sulfate 325 (65 FE) MG tablet, Take 325 mg by mouth daily with breakfast., Disp: , Rfl:  .  HYDROcodone-acetaminophen (NORCO/VICODIN) 5-325 MG tablet, Take 1 tablet by mouth every 6 (six) hours as needed for moderate pain., Disp: 20 tablet, Rfl: 0 .  Multiple Vitamin (MULTIVITAMIN) tablet, Take 1 tablet by mouth at bedtime. , Disp: , Rfl:  .  potassium chloride SA (K-DUR,KLOR-CON) 20 MEQ tablet, Take 1 tablet (20 mEq total)  by mouth daily., Disp: 30 tablet, Rfl: 3 .  Probiotic Product (PROBIOTIC DAILY PO), Take by mouth., Disp: , Rfl:  .  sertraline (ZOLOFT) 100 MG tablet, 1 (ONE) TABLET, ORAL, AT BEDTIME DAILY BY MOUTH, Disp: 90 tablet, Rfl: 3 .  SPIRIVA HANDIHALER 18 MCG inhalation capsule, INHALE 1 CAPSULE ONCE A DAY, Disp: 30 capsule, Rfl: 11 .  SYMBICORT 160-4.5 MCG/ACT inhaler, INHALE 2 PUFFS INTO THE LUNGS 2 (TWO) TIMES DAILY., Disp: 10.2 Inhaler, Rfl: 3 .  traMADol (ULTRAM) 50 MG tablet, Take 1 tablet (50 mg total) by mouth every 8 (eight) hours as needed., Disp: 30 tablet, Rfl: 0 .  vitamin E 400 UNIT capsule, Take 400 Units by mouth daily., Disp: , Rfl:   Review of Systems  Constitutional: Positive for fatigue.  Respiratory: Positive for shortness of breath and wheezing.     Cardiovascular: Negative for chest pain, palpitations and leg swelling.  Musculoskeletal: Positive for arthralgias and myalgias.  Skin: Positive for wound.       Due to a dog bite  Neurological: Negative.   Psychiatric/Behavioral: Negative.    Social History   Tobacco Use  . Smoking status: Current Every Day Smoker    Packs/day: 0.50    Years: 45.00    Pack years: 22.50    Types: Cigarettes  . Smokeless tobacco: Never Used  . Tobacco comment: pt uses nictine patches occasioanlly   Substance Use Topics  . Alcohol use: No    Alcohol/week: 0.0 oz   Objective:   BP 110/68 (BP Location: Right Arm, Patient Position: Sitting, Cuff Size: Normal)   Pulse (!) 112   Temp 98.2 F (36.8 C)   Resp 20   Ht 5\' 4"  (1.626 m)   SpO2 90% Comment: w 2L of O2  BMI 23.86 kg/m  Vitals:   03/17/18 1027  BP: 110/68  Pulse: (!) 112  Resp: 20  Temp: 98.2 F (36.8 C)  SpO2: 90%  Height: 5\' 4"  (1.626 m)     Physical Exam  Constitutional: She is oriented to person, place, and time. She appears well-developed and well-nourished.  HENT:  Head: Normocephalic.  Eyes: Pupils are equal, round, and reactive to light. EOM are normal.  Cardiovascular: Normal rate and regular rhythm.  Pulmonary/Chest: She is in respiratory distress.  Distant breath sounds without wheezes, rales or rhonchi today.  Musculoskeletal:       Arms: Large gaping 10 cm laceration across the dorsum of the left hand, across the 4th MCP joint and up the ring finger. Multiple bruises and 2.5 cm laceration on the volar surface of the middle finger over the proximal phalange.  Neurological: She is oriented to person, place, and time.       Assessment & Plan:     1. Dog bite of left hand, subsequent encounter Dog bite at home on 02-11-18 and taken to [facility] ER. No bony injury identified per patient report. Laceration the back of the left hand in 10 cm long with tendon exposure across the 4th MCP joint and dorsum of hand. Was  treated with Augmentin and scheduled for hand surgeon referral but she refuses to go back to [facility] due to the distance and difficulty attaining transportation. Will schedule urgent referral to a local hand surgeon. Redressed hand with antibiotic ointment, Telfa and gauze. States dog is in quarantine and had documented vaccinations up to date. She was given a tetanus booster at the ER. - CBC with Differential/Platelet - Ambulatory referral to Hand Surgery  2. Hx of hypokalemia Still taking the Klor-con 20 meq qd. Recheck CMP today. - Comprehensive metabolic panel  3. Calculus of gallbladder with biliary obstruction but without cholecystitis Less RUQ pain if she controls fats in diet. Occasionally has to use the Norco. Surgeon cautious about any surgery with her sever hypoxia (pulse oximetry 85% today on 2.5 LPM oxygen by nasal cannula). Recheck CBC and CMP. Refilled the Norco and advised to keep follow up with surgeon. - CBC with Differential/Platelet - Comprehensive metabolic panel - HYDROcodone-acetaminophen (NORCO/VICODIN) 5-325 MG tablet; Take 1 tablet by mouth every 6 (six) hours as needed for moderate pain.  Dispense: 20 tablet; Refill: 0       Dortha Kernennis Chrismon, PA  [facility]

## 2018-03-18 ENCOUNTER — Ambulatory Visit: Payer: Medicare HMO | Admitting: Family Medicine

## 2018-03-18 ENCOUNTER — Other Ambulatory Visit: Payer: Self-pay | Admitting: Family Medicine

## 2018-03-18 DIAGNOSIS — S61411A Laceration without foreign body of right hand, initial encounter: Secondary | ICD-10-CM | POA: Diagnosis not present

## 2018-03-18 LAB — CBC WITH DIFFERENTIAL/PLATELET
BASOS ABS: 0 10*3/uL (ref 0.0–0.2)
Basos: 0 %
EOS (ABSOLUTE): 0.3 10*3/uL (ref 0.0–0.4)
EOS: 2 %
HEMATOCRIT: 34.2 % (ref 34.0–46.6)
HEMOGLOBIN: 10.5 g/dL — AB (ref 11.1–15.9)
IMMATURE GRANS (ABS): 0 10*3/uL (ref 0.0–0.1)
Immature Granulocytes: 0 %
LYMPHS: 8 %
Lymphocytes Absolute: 1.3 10*3/uL (ref 0.7–3.1)
MCH: 29.2 pg (ref 26.6–33.0)
MCHC: 30.7 g/dL — ABNORMAL LOW (ref 31.5–35.7)
MCV: 95 fL (ref 79–97)
MONOCYTES: 7 %
Monocytes Absolute: 1.2 10*3/uL — ABNORMAL HIGH (ref 0.1–0.9)
Neutrophils Absolute: 14.2 10*3/uL — ABNORMAL HIGH (ref 1.4–7.0)
Neutrophils: 83 %
Platelets: 457 10*3/uL — ABNORMAL HIGH (ref 150–379)
RBC: 3.6 x10E6/uL — AB (ref 3.77–5.28)
RDW: 14 % (ref 12.3–15.4)
WBC: 17.1 10*3/uL — AB (ref 3.4–10.8)

## 2018-03-18 LAB — COMPREHENSIVE METABOLIC PANEL
ALBUMIN: 3.9 g/dL (ref 3.5–4.8)
ALT: 15 IU/L (ref 0–32)
AST: 23 IU/L (ref 0–40)
Albumin/Globulin Ratio: 1.5 (ref 1.2–2.2)
Alkaline Phosphatase: 123 IU/L — ABNORMAL HIGH (ref 39–117)
BUN / CREAT RATIO: 26 (ref 12–28)
BUN: 15 mg/dL (ref 8–27)
Bilirubin Total: <0.2 mg/dL (ref 0.0–1.2)
CALCIUM: 9.7 mg/dL (ref 8.7–10.3)
CO2: 32 mmol/L — AB (ref 20–29)
CREATININE: 0.58 mg/dL (ref 0.57–1.00)
Chloride: 92 mmol/L — ABNORMAL LOW (ref 96–106)
GFR, EST AFRICAN AMERICAN: 108 mL/min/{1.73_m2} (ref >59)
GFR, EST NON AFRICAN AMERICAN: 94 mL/min/{1.73_m2} (ref >59)
GLUCOSE: 103 mg/dL — AB (ref 65–99)
Globulin, Total: 2.6 g/dL (ref 1.5–4.5)
Potassium: 4.1 mmol/L (ref 3.5–5.2)
Sodium: 140 mmol/L (ref 134–144)
TOTAL PROTEIN: 6.5 g/dL (ref 6.0–8.5)

## 2018-03-21 ENCOUNTER — Telehealth: Payer: Self-pay

## 2018-03-21 NOTE — Telephone Encounter (Signed)
-----   Message from Tamsen Roersennis E Chrismon, [place] sent at 03/20/2018  5:50 PM EDT ----- WBC count elevated with slight drop in Hgb - probably due to dog bite with severe laceration and bleeding from her left hand. Elevated CO2 due to COPD with hypoxia. Continue antibiotics, oxygen and present medications. Proceed with referral to hand surgeon.

## 2018-03-21 NOTE — Telephone Encounter (Signed)
Patient advised.

## 2018-03-23 DIAGNOSIS — J449 Chronic obstructive pulmonary disease, unspecified: Secondary | ICD-10-CM | POA: Diagnosis not present

## 2018-03-24 ENCOUNTER — Telehealth: Payer: Self-pay

## 2018-03-24 NOTE — Telephone Encounter (Signed)
Called pt to set up her AWV and pt states that she recently got bite by a dog and she is waiting to have her gallbladder removed, so right now she is busy. Pt states to CB in May and maybe she can schedule then. Note made. -MM

## 2018-03-27 ENCOUNTER — Ambulatory Visit (INDEPENDENT_AMBULATORY_CARE_PROVIDER_SITE_OTHER): Payer: Medicare HMO | Admitting: Family Medicine

## 2018-03-27 ENCOUNTER — Encounter: Payer: Self-pay | Admitting: Family Medicine

## 2018-03-27 VITALS — BP 118/60 | HR 110 | Temp 98.3°F | Resp 22

## 2018-03-27 DIAGNOSIS — M25552 Pain in left hip: Secondary | ICD-10-CM

## 2018-03-27 DIAGNOSIS — R0902 Hypoxemia: Secondary | ICD-10-CM | POA: Diagnosis not present

## 2018-03-27 DIAGNOSIS — S61411A Laceration without foreign body of right hand, initial encounter: Secondary | ICD-10-CM | POA: Diagnosis not present

## 2018-03-27 DIAGNOSIS — S61452D Open bite of left hand, subsequent encounter: Secondary | ICD-10-CM | POA: Diagnosis not present

## 2018-03-27 DIAGNOSIS — K8021 Calculus of gallbladder without cholecystitis with obstruction: Secondary | ICD-10-CM | POA: Diagnosis not present

## 2018-03-27 DIAGNOSIS — K8020 Calculus of gallbladder without cholecystitis without obstruction: Secondary | ICD-10-CM | POA: Diagnosis not present

## 2018-03-27 DIAGNOSIS — J449 Chronic obstructive pulmonary disease, unspecified: Secondary | ICD-10-CM | POA: Diagnosis not present

## 2018-03-27 DIAGNOSIS — W540XXD Bitten by dog, subsequent encounter: Secondary | ICD-10-CM

## 2018-03-27 DIAGNOSIS — D649 Anemia, unspecified: Secondary | ICD-10-CM

## 2018-03-27 MED ORDER — HYDROCODONE-ACETAMINOPHEN 5-325 MG PO TABS: 1.0000 | ORAL_TABLET | Freq: Four times a day (QID) | ORAL | 0 refills | Status: DC | PRN

## 2018-03-27 NOTE — Progress Notes (Signed)
Patient: Hailey Gibbs Female    DOB: 1946/04/10   72 y.o.   MRN: [id number] Visit Date: 03/27/2018  Today's Provider: Dortha Kernennis Beronica [place], PA   Chief Complaint  Patient presents with  . COPD    document medical necessity for portable O2    Subjective:    HPI  COPD:  Patient presents today for a follow up. She is also needing documentation of medical necessity for portable oxygen system. Patient is currently using Albuterol, Spiriva, and Symbicort inhalers. She reports symptoms are   Past Medical History:  Diagnosis Date  . Abnormal chest sounds 08/29/2015  . Acute respiratory failure (HCC)   . Anxiety   . BP (high blood pressure) 08/29/2015  . Carotid artery occlusion   . Carotid stenosis 06/23/2015  . COPD (chronic obstructive pulmonary disease) (HCC)   . COPD exacerbation (HCC) 10/26/2016  . Depression   . GERD (gastroesophageal reflux disease)   . Hyperlipidemia   . Hypertension   . Obstructive chronic bronchitis with exacerbation (HCC) 08/29/2015  . Palliative care by specialist   . Seasonal allergies   . Shortness of breath dyspnea    Past Surgical History:  Procedure Laterality Date  . ENDARTERECTOMY Left 06/23/2015   Procedure: ENDARTERECTOMY CAROTID;  Surgeon: Annice NeedyJason S Dew, MD;  Location: [facility] ORS;  Service: Vascular;  Laterality: Left;  . TUBAL LIGATION    . VAGINAL HYSTERECTOMY     Family History  Problem Relation Age of Onset  . Heart disease Father 375       CABG   . Hyperlipidemia Father   . Pneumonia Father   . Dementia Mother   . COPD Mother   . Diabetes Sister   . Parkinson's disease Brother   . Post-traumatic stress disorder Son    Allergies  Allergen Reactions  . Codeine Anxiety         Current Outpatient Medications:  .  acetaminophen (TYLENOL) 500 MG tablet, Take 1,000 mg by mouth every 6 (six) hours as needed for mild pain or headache. , Disp: , Rfl:  .  albuterol (VENTOLIN HFA) 108 (90 Base) MCG/ACT inhaler, INHALE 1-2 PUFFS 4  TIMES A DAY AS NEEDED FOR WHEEZING, Disp: 18 Inhaler, Rfl: 2 .  amLODipine (NORVASC) 10 MG tablet, TAKE 1 TABLET (10 MG TOTAL) BY MOUTH DAILY., Disp: 90 tablet, Rfl: 3 .  aspirin 81 MG chewable tablet, Chew 81 mg by mouth at bedtime. , Disp: , Rfl:  .  atorvastatin (LIPITOR) 10 MG tablet, Take 1 tablet (10 mg total) by mouth daily., Disp: 90 tablet, Rfl: 3 .  B Complex-C-Folic Acid (HM SUPER VITAMIN B COMPLEX/C PO), Take 1 tablet by mouth daily. , Disp: , Rfl:  .  calcium carbonate (OSCAL) 1500 (600 Ca) MG TABS tablet, Take 600 mg of elemental calcium by mouth daily with breakfast., Disp: , Rfl:  .  cetirizine (ZYRTEC) 10 MG tablet, Take 10 mg by mouth at bedtime. , Disp: , Rfl:  .  Cholecalciferol (VITAMIN D3) 2000 units TABS, Take 2,000 Units by mouth daily. , Disp: , Rfl:  .  ferrous sulfate 325 (65 FE) MG tablet, Take 325 mg by mouth daily with breakfast., Disp: , Rfl:  .  HYDROcodone-acetaminophen (NORCO/VICODIN) 5-325 MG tablet, Take 1 tablet by mouth every 6 (six) hours as needed for moderate pain., Disp: 20 tablet, Rfl: 0 .  Multiple Vitamin (MULTIVITAMIN) tablet, Take 1 tablet by mouth at bedtime. , Disp: , Rfl:  .  potassium chloride SA (K-DUR,KLOR-CON) 20 MEQ tablet, Take 1 tablet (20 mEq total) by mouth daily., Disp: 30 tablet, Rfl: 3 .  Probiotic Product (PROBIOTIC DAILY PO), Take by mouth., Disp: , Rfl:  .  sertraline (ZOLOFT) 100 MG tablet, 1 (ONE) TABLET, ORAL, AT BEDTIME DAILY BY MOUTH, Disp: 90 tablet, Rfl: 3 .  SPIRIVA HANDIHALER 18 MCG inhalation capsule, INHALE 1 CAPSULE ONCE A DAY, Disp: 30 capsule, Rfl: 11 .  SYMBICORT 160-4.5 MCG/ACT inhaler, INHALE 2 PUFFS INTO THE LUNGS 2 (TWO) TIMES DAILY., Disp: 10.2 Inhaler, Rfl: 3 .  traMADol (ULTRAM) 50 MG tablet, Take 1 tablet (50 mg total) by mouth every 8 (eight) hours as needed., Disp: 30 tablet, Rfl: 0 .  vitamin E 400 UNIT capsule, Take 400 Units by mouth daily., Disp: , Rfl:   Review of Systems  Constitutional: Negative.     Respiratory: Positive for cough, shortness of breath and wheezing.   Cardiovascular: Negative.    Social History   Tobacco Use  . Smoking status: Current Every Day Smoker    Packs/day: 0.50    Years: 45.00    Pack years: 22.50    Types: Cigarettes  . Smokeless tobacco: Never Used  . Tobacco comment: pt uses nictine patches occasioanlly   Substance Use Topics  . Alcohol use: No    Alcohol/week: 0.0 oz   Objective:   BP 118/60 (BP Location: Right Arm, Patient Position: Sitting, Cuff Size: Normal)   Pulse (!) 110   Temp 98.3 F (36.8 C) (Oral)   Resp (!) 22   SpO2 (!) 86%   Physical Exam  Constitutional: She is oriented to person, place, and time. She appears well-developed and well-nourished. No distress.  HENT:  Head: Normocephalic and atraumatic.  Right Ear: Hearing normal.  Left Ear: Hearing normal.  Nose: Nose normal.  Eyes: Conjunctivae and lids are normal. Right eye exhibits no discharge. Left eye exhibits no discharge. No scleral icterus.  Neck: Neck supple.  Cardiovascular: Regular rhythm.  tachycardia  Pulmonary/Chest: She is in respiratory distress.  Distant breath sound and tachypnea with minor exertion. Minimal wheeze intermittently. No rales or rhonchi.  Abdominal: Soft. Bowel sounds are normal.  Musculoskeletal: Normal range of motion. She exhibits no edema.  Limited mobility due to respiratory distress/COPD. Brought in by wheelchair today.  Neurological: She is alert and oriented to person, place, and time.  Skin: Skin is intact. No lesion and no rash noted.  Psychiatric: She has a normal mood and affect. Her speech is normal and behavior is normal. Thought content normal.      Assessment & Plan:     1. COPD with hypoxia (HCC) Chronic dyspnea with cough and occasional wheezes worsening for years. Still smokes 1/2 ppd despite past attempts to stop. Has been trying to use a nicotine patch intermittently. Symbicort 160-4.5 mcg/act 2 puffs BID, Spiriva 18  mcg qd, Ventolin-HFA 1-2 puffs QID prn wheeze and oxygen by nasal cannula at 2-2.5 LPM helps stabilize breathing. CT of chest on 12-19-17 confirmed moderate to severe emphysema and CXR in the ER on 02-11-18 showed hyperinflation, peribronchial thickening and diffuse interstitial coarsening. Pulse oximetry 88% on 2.5 LPM sitting at rest which drops to 83% on 2.5 LPM just to walk 6-8 feet to transfer to an exam area from a wheelchair. Could not test without oxygen due to severe drops in oxygen level and severe dyspnea/weakness. Cannot talk after trying to walk until she rests for 30-60 seconds on oxygen. Oxygen at 2-2.5 LPM  24 hours a day continues to be extremely medically necessary. These readings are taken at her average stable condition. States she doesn't feel she is in significant distress. Has family living with her to help with ADL's she can't accomplish. Recheck CBC and CMP. To schedule follow up pending lab reports. - CBC with Differential/Platelet - Comprehensive metabolic panel  2. Low hemoglobin Hgb down to 10.5 after bleeding from dog bite on 03-14-18 with severe laceration of the left hand. Recheck CBC to evaluate progress. - CBC with Differential/Platelet  3. Left hip pain Since getting the dog bite from a household pet on 03-14-18, has had some pain in the left hip. X-rays in the [facility]ER did not show an acute injury. Will check CBC for evidence of infection and refilled Norco to use for pain of gallstones and left hip arthritis. May need recheck if x-ray or MRI for occult injury if no better soon. - CBC with Differential/Platelet - HYDROcodone-acetaminophen (NORCO/VICODIN) 5-325 MG tablet; Take 1 tablet by mouth every 6 (six) hours as needed for moderate pain.  Dispense: 30 tablet; Refill: 0  4. Dog bite of left hand, subsequent encounter Was mistakenly bitten by her household dog on 03-14-18. Still has some pain in the large left hand laceration. Hand surgeon recommended cleaning the wound and  redress with antibiotic ointment, Xeroform gauze and a gentle wrap 2-3 times a day until the wound heals.Will have follow up with Dr. Larkin Ina 04-10-18 at 1:20 pm. Refilled Norco for pain and recheck CBC to assess for infection. - CBC with Differential/Platelet - HYDROcodone-acetaminophen (NORCO/VICODIN) 5-325 MG tablet; Take 1 tablet by mouth every 6 (six) hours as needed for moderate pain.  Dispense: 30 tablet; Refill: 0  5. Calculus of gallbladder with biliary obstruction but without cholecystitis Evaluated by Dr. Excell Seltzer (surgeon) 02-17-18 and decided to postpone any surgical intervention at the present due to severe COPD with hypoxia. Still having intermittent RUQ discomfort especially with eating spicy or greasy foods. Has to use Norco occasionally with flares. Minimal discomfort today with normal bowel sounds. Recheck CBC and CMP. Follow up with surgeon if pains do not abate. - CBC with Differential/Platelet - Comprehensive metabolic panel - HYDROcodone-acetaminophen (NORCO/VICODIN) 5-325 MG tablet; Take 1 tablet by mouth every 6 (six) hours as needed for moderate pain.  Dispense: 30 tablet; Refill: 0       Dortha Kern, PA  [facility]

## 2018-03-28 LAB — COMPREHENSIVE METABOLIC PANEL
ALK PHOS: 172 IU/L — AB (ref 39–117)
ALT: 7 IU/L (ref 0–32)
AST: 12 IU/L (ref 0–40)
Albumin/Globulin Ratio: 1.4 (ref 1.2–2.2)
Albumin: 3.7 g/dL (ref 3.5–4.8)
BUN/Creatinine Ratio: 19 (ref 12–28)
BUN: 10 mg/dL (ref 8–27)
Bilirubin Total: <0.2 mg/dL (ref 0.0–1.2)
CO2: 35 mmol/L — AB (ref 20–29)
CREATININE: 0.52 mg/dL — AB (ref 0.57–1.00)
Calcium: 9.9 mg/dL (ref 8.7–10.3)
Chloride: 94 mmol/L — ABNORMAL LOW (ref 96–106)
GFR calc Af Amer: 112 mL/min/{1.73_m2} (ref >59)
GFR calc non Af Amer: 97 mL/min/{1.73_m2} (ref >59)
GLUCOSE: 99 mg/dL (ref 65–99)
Globulin, Total: 2.7 g/dL (ref 1.5–4.5)
Potassium: 4.8 mmol/L (ref 3.5–5.2)
Sodium: 142 mmol/L (ref 134–144)
Total Protein: 6.4 g/dL (ref 6.0–8.5)

## 2018-03-28 LAB — CBC WITH DIFFERENTIAL/PLATELET
BASOS ABS: 0 10*3/uL (ref 0.0–0.2)
Basos: 0 %
EOS (ABSOLUTE): 0.2 10*3/uL (ref 0.0–0.4)
Eos: 1 %
HEMOGLOBIN: 10.5 g/dL — AB (ref 11.1–15.9)
Hematocrit: 35.2 % (ref 34.0–46.6)
Immature Grans (Abs): 0 10*3/uL (ref 0.0–0.1)
Immature Granulocytes: 0 %
LYMPHS ABS: 1.3 10*3/uL (ref 0.7–3.1)
Lymphs: 9 %
MCH: 28.1 pg (ref 26.6–33.0)
MCHC: 29.8 g/dL — AB (ref 31.5–35.7)
MCV: 94 fL (ref 79–97)
MONOCYTES: 6 %
MONOS ABS: 0.9 10*3/uL (ref 0.1–0.9)
Neutrophils Absolute: 11.9 10*3/uL — ABNORMAL HIGH (ref 1.4–7.0)
Neutrophils: 84 %
Platelets: 451 10*3/uL — ABNORMAL HIGH (ref 150–379)
RBC: 3.74 x10E6/uL — ABNORMAL LOW (ref 3.77–5.28)
RDW: 14.1 % (ref 12.3–15.4)
WBC: 14.3 10*3/uL — AB (ref 3.4–10.8)

## 2018-03-31 DIAGNOSIS — J449 Chronic obstructive pulmonary disease, unspecified: Secondary | ICD-10-CM | POA: Diagnosis not present

## 2018-04-10 ENCOUNTER — Telehealth: Payer: Self-pay | Admitting: Family Medicine

## 2018-04-10 DIAGNOSIS — S61452D Open bite of left hand, subsequent encounter: Secondary | ICD-10-CM | POA: Diagnosis not present

## 2018-04-10 NOTE — Telephone Encounter (Signed)
Spoke with patient she states she received a letter from [place] stating her insurance declined coverage for her O2 because PCP office did not return medical documentation as requested. Medical records was faxed to [employer] on 04/03/18 with confirmation, and again on 04/10/18. Patient would like for [place] to contact [employer] at [phone] to make sure they received requested documentation they need for coverage. Called [employer] at 11:41 am and was on hold for 11 minutes before speaking with Vernona Rieger the representative. Vernona Rieger states they did receive records, but request letter was mailed out to patient before they received records. Vernona Rieger states the records will now be submitted to insurance for coverage. Patient advised per Vernona Rieger to disregard notice.

## 2018-04-10 NOTE — Telephone Encounter (Signed)
Pt states she received something in the mail from [place] about her oxygen and request that [facility] call her back. Thanks CC

## 2018-04-15 NOTE — Telephone Encounter (Signed)
Scheduled pt for 04/23/18 @ 2 PM. -MM

## 2018-04-22 DIAGNOSIS — J449 Chronic obstructive pulmonary disease, unspecified: Secondary | ICD-10-CM | POA: Diagnosis not present

## 2018-04-23 ENCOUNTER — Telehealth: Payer: Self-pay

## 2018-04-23 ENCOUNTER — Ambulatory Visit (INDEPENDENT_AMBULATORY_CARE_PROVIDER_SITE_OTHER): Payer: Medicare HMO

## 2018-04-23 VITALS — BP 112/64 | HR 98 | Temp 98.0°F | Ht 64.0 in | Wt 125.2 lb

## 2018-04-23 DIAGNOSIS — Z23 Encounter for immunization: Secondary | ICD-10-CM

## 2018-04-23 DIAGNOSIS — Z0000 Encounter for general adult medical examination without abnormal findings: Secondary | ICD-10-CM

## 2018-04-23 NOTE — Patient Instructions (Addendum)
Hailey Gibbs , Thank you for taking time to come for your Medicare Wellness Visit. I appreciate your ongoing commitment to your health goals. Please review the following plan we discussed and let me know if I can assist you in the future.   Screening recommendations/referrals: Colonoscopy: Pt declines today.  Mammogram: Pt declines today.  Bone Density: Pt declines today.  Recommended yearly ophthalmology/optometry visit for glaucoma screening and checkup Recommended yearly dental visit for hygiene and checkup  Vaccinations: Influenza vaccine: N/A Pneumococcal vaccine: Up to date (as of today). Tdap vaccine: Up to date Shingles vaccine: Pt declines today.     Advanced directives: Advance directive discussed with you today. Even though you declined this today please call our office should you change your mind and we can give you the proper paperwork for you to fill out.  Conditions/risks identified: Recommend increasing water intake to 4-6 glasses a day and eat 3 small meals a day with 2 healthy snacks in between.  Next appointment: 06/26/18 @ 11 AM with Dortha Kern.   Preventive Care 71 Years and Older, Female Preventive care refers to lifestyle choices and visits with your health care provider that can promote health and wellness. What does preventive care include?  A yearly physical exam. This is also called an annual well check.  Dental exams once or twice a year.  Routine eye exams. Ask your health care provider how often you should have your eyes checked.  Personal lifestyle choices, including:  Daily care of your teeth and gums.  Regular physical activity.  Eating a healthy diet.  Avoiding tobacco and drug use.  Limiting alcohol use.  Practicing safe sex.  Taking low-dose aspirin every day.  Taking vitamin and mineral supplements as recommended by your health care provider. What happens during an annual well check? The services and screenings done by your  health care provider during your annual well check will depend on your age, overall health, lifestyle risk factors, and family history of disease. Counseling  Your health care provider may ask you questions about your:  Alcohol use.  Tobacco use.  Drug use.  Emotional well-being.  Home and relationship well-being.  Sexual activity.  Eating habits.  History of falls.  Memory and ability to understand (cognition).  Work and work Astronomer.  Reproductive health. Screening  You may have the following tests or measurements:  Height, weight, and BMI.  Blood pressure.  Lipid and cholesterol levels. These may be checked every 5 years, or more frequently if you are over 42 years old.  Skin check.  Lung cancer screening. You may have this screening every year starting at age 36 if you have a 30-pack-year history of smoking and currently smoke or have quit within the past 15 years.  Fecal occult blood test (FOBT) of the stool. You may have this test every year starting at age 62.  Flexible sigmoidoscopy or colonoscopy. You may have a sigmoidoscopy every 5 years or a colonoscopy every 10 years starting at age 9.  Hepatitis C blood test.  Hepatitis B blood test.  Sexually transmitted disease (STD) testing.  Diabetes screening. This is done by checking your blood sugar (glucose) after you have not eaten for a while (fasting). You may have this done every 1-3 years.  Bone density scan. This is done to screen for osteoporosis. You may have this done starting at age 72.  Mammogram. This may be done every 1-2 years. Talk to your health care provider about how often you  should have regular mammograms. Talk with your health care provider about your test results, treatment options, and if necessary, the need for more tests. Vaccines  Your health care provider may recommend certain vaccines, such as:  Influenza vaccine. This is recommended every year.  Tetanus, diphtheria, and  acellular pertussis (Tdap, Td) vaccine. You may need a Td booster every 10 years.  Zoster vaccine. You may need this after age 69.  Pneumococcal 13-valent conjugate (PCV13) vaccine. One dose is recommended after age 32.  Pneumococcal polysaccharide (PPSV23) vaccine. One dose is recommended after age 67. Talk to your health care provider about which screenings and vaccines you need and how often you need them. This information is not intended to replace advice given to you by your health care provider. Make sure you discuss any questions you have with your health care provider. Document Released: 12/23/2015 Document Revised: 08/15/2016 Document Reviewed: 09/27/2015 Elsevier Interactive Patient Education  2017 [employer] Prevention in the Home Falls can cause injuries. They can happen to people of all ages. There are many things you can do to make your home safe and to help prevent falls. What can I do on the outside of my home?  Regularly fix the edges of walkways and driveways and fix any cracks.  Remove anything that might make you trip as you walk through a door, such as a raised step or threshold.  Trim any bushes or trees on the path to your home.  Use bright outdoor lighting.  Clear any walking paths of anything that might make someone trip, such as rocks or tools.  Regularly check to see if handrails are loose or broken. Make sure that both sides of any steps have handrails.  Any raised decks and porches should have guardrails on the edges.  Have any leaves, snow, or ice cleared regularly.  Use sand or salt on walking paths during winter.  Clean up any spills in your garage right away. This includes oil or grease spills. What can I do in the bathroom?  Use night lights.  Install grab bars by the toilet and in the tub and shower. Do not use towel bars as grab bars.  Use non-skid mats or decals in the tub or shower.  If you need to sit down in the shower, use  a plastic, non-slip stool.  Keep the floor dry. Clean up any water that spills on the floor as soon as it happens.  Remove soap buildup in the tub or shower regularly.  Attach bath mats securely with double-sided non-slip rug tape.  Do not have throw rugs and other things on the floor that can make you trip. What can I do in the bedroom?  Use night lights.  Make sure that you have a light by your bed that is easy to reach.  Do not use any sheets or blankets that are too big for your bed. They should not hang down onto the floor.  Have a firm chair that has side arms. You can use this for support while you get dressed.  Do not have throw rugs and other things on the floor that can make you trip. What can I do in the kitchen?  Clean up any spills right away.  Avoid walking on wet floors.  Keep items that you use a lot in easy-to-reach places.  If you need to reach something above you, use a strong step stool that has a grab bar.  Keep electrical cords out  of the way.  Do not use floor polish or wax that makes floors slippery. If you must use wax, use non-skid floor wax.  Do not have throw rugs and other things on the floor that can make you trip. What can I do with my stairs?  Do not leave any items on the stairs.  Make sure that there are handrails on both sides of the stairs and use them. Fix handrails that are broken or loose. Make sure that handrails are as long as the stairways.  Check any carpeting to make sure that it is firmly attached to the stairs. Fix any carpet that is loose or worn.  Avoid having throw rugs at the top or bottom of the stairs. If you do have throw rugs, attach them to the floor with carpet tape.  Make sure that you have a light switch at the top of the stairs and the bottom of the stairs. If you do not have them, ask someone to add them for you. What else can I do to help prevent falls?  Wear shoes that:  Do not have high heels.  Have  rubber bottoms.  Are comfortable and fit you well.  Are closed at the toe. Do not wear sandals.  If you use a stepladder:  Make sure that it is fully opened. Do not climb a closed stepladder.  Make sure that both sides of the stepladder are locked into place.  Ask someone to hold it for you, if possible.  Clearly mark and make sure that you can see:  Any grab bars or handrails.  First and last steps.  Where the edge of each step is.  Use tools that help you move around (mobility aids) if they are needed. These include:  Canes.  Walkers.  Scooters.  Crutches.  Turn on the lights when you go into a dark area. Replace any light bulbs as soon as they burn out.  Set up your furniture so you have a clear path. Avoid moving your furniture around.  If any of your floors are uneven, fix them.  If there are any pets around you, be aware of where they are.  Review your medicines with your doctor. Some medicines can make you feel dizzy. This can increase your chance of falling. Ask your doctor what other things that you can do to help prevent falls. This information is not intended to replace advice given to you by your health care provider. Make sure you discuss any questions you have with your health care provider. Document Released: 09/22/2009 Document Revised: 05/03/2016 Document Reviewed: 12/31/2014 Elsevier Interactive Patient Education  2017 [employer].

## 2018-04-23 NOTE — Progress Notes (Addendum)
Subjective:   Hailey Gibbs is a 72 y.o. female who presents for Medicare Annual (Subsequent) preventive examination.  Review of Systems:  N/A  Cardiac Risk Factors include: advanced age (>19men, >96 women);dyslipidemia;hypertension;smoking/ tobacco exposure     Objective:     Vitals: BP 112/64 (BP Location: Right Arm)   Pulse 98   Temp 98 F (36.7 C) (Oral)   Ht  (1.626 m)   Wt 125 lb 3.2 oz (56.8 kg)   BMI 21.49 kg/m   Body mass index is 21.49 kg/m.  Advanced Directives 04/23/2018 02/11/2018 02/09/2018 01/17/2018 12/18/2017 12/18/2017 04/12/2017  Does Patient Have a Medical Advance Directive? No No No No No No No  Would patient like information on creating a medical advance directive? No - Patient declined - - No - Patient declined No - Patient declined No - Patient declined Yes (ED - Information included in AVS)    Tobacco Social History   Tobacco Use  Smoking Status Current Every Day Smoker  . Packs/day: 0.50  . Years: 45.00  . Pack years: 22.50  . Types: Cigarettes  Smokeless Tobacco Never Used  Tobacco Comment   give our take 0-1 pack a day / uses patch PRN     Ready to quit: Not Answered Counseling given: Not Answered Comment: give our take 0-1 pack a day / uses patch PRN   Clinical Intake:  Pre-visit preparation completed: Yes  Pain : No/denies pain Pain Score: 0-No pain     Nutritional Status: BMI of 19-24  Normal Nutritional Risks: None Diabetes: No  How often do you need to have someone help you when you read instructions, pamphlets, or other written materials from your doctor or pharmacy?: 1 - Never  Interpreter Needed?: No  Information entered by :: [facility]: Lambert Rhonda W, LPN  Past Medical History:  Diagnosis Date  . Abnormal chest sounds 08/29/2015  . Acute respiratory failure (HCC)   . Anxiety   . BP (high blood pressure) 08/29/2015  . Carotid artery occlusion   . Carotid stenosis 06/23/2015  . COPD (chronic obstructive pulmonary disease)  (HCC)   . COPD exacerbation (HCC) 10/26/2016  . Depression   . GERD (gastroesophageal reflux disease)   . Hyperlipidemia   . Hypertension   . Obstructive chronic bronchitis with exacerbation (HCC) 08/29/2015  . Palliative care by specialist   . Seasonal allergies   . Shortness of breath dyspnea    Past Surgical History:  Procedure Laterality Date  . ENDARTERECTOMY Left 06/23/2015   Procedure: ENDARTERECTOMY CAROTID;  Surgeon: Annice Needy, MD;  Location: [facility] ORS;  Service: Vascular;  Laterality: Left;  . TUBAL LIGATION    . VAGINAL HYSTERECTOMY     Family History  Problem Relation Age of Onset  . Heart disease Father 47       CABG   . Hyperlipidemia Father   . Pneumonia Father   . Dementia Mother   . COPD Mother   . Diabetes Sister   . Parkinson's disease Brother   . Post-traumatic stress disorder Son    Social History   Socioeconomic History  . Marital status: Divorced    Spouse name: Not on file  . Number of children: 2  . Years of education: Not on file  . Highest education level: 12th grade  Occupational History  . Occupation: retired  Engineer, production  . Financial resource strain: Not hard at all  . Food insecurity:    Worry: Never true    Inability: Never  true  . Transportation needs:    Medical: No    Non-medical: No  Tobacco Use  . Smoking status: Current Every Day Smoker    Packs/day: 0.50    Years: 45.00    Pack years: 22.50    Types: Cigarettes  . Smokeless tobacco: Never Used  . Tobacco comment: give our take 0-1 pack a day / uses patch PRN  Substance and Sexual Activity  . Alcohol use: No    Alcohol/week: 0.0 oz  . Drug use: No  . Sexual activity: Not on file  Lifestyle  . Physical activity:    Days per week: Not on file    Minutes per session: Not on file  . Stress: Only a little  Relationships  . Social connections:    Talks on phone: Not on file    Gets together: Not on file    Attends religious service: Not on file    Active member of  club or organization: Not on file    Attends meetings of clubs or organizations: Not on file    Relationship status: Not on file  Other Topics Concern  . Not on file  Social History Narrative  . Not on file    Outpatient Encounter Medications as of 04/23/2018  Medication Sig  . acetaminophen (TYLENOL) 500 MG tablet Take 1,000 mg by mouth every 6 (six) hours as needed for mild pain or headache.   . albuterol (VENTOLIN HFA) 108 (90 Base) MCG/ACT inhaler INHALE 1-2 PUFFS 4 TIMES A DAY AS NEEDED FOR WHEEZING  . amLODipine (NORVASC) 10 MG tablet TAKE 1 TABLET (10 MG TOTAL) BY MOUTH DAILY.  Marland Kitchen aspirin 81 MG chewable tablet Chew 81 mg by mouth at bedtime.   Marland Kitchen atorvastatin (LIPITOR) 10 MG tablet Take 1 tablet (10 mg total) by mouth daily.  . B Complex-C-Folic Acid (HM SUPER VITAMIN B COMPLEX/C PO) Take 1 tablet by mouth daily.   . calcium carbonate (OSCAL) 1500 (600 Ca) MG TABS tablet Take 600 mg of elemental calcium by mouth daily with breakfast.  . cetirizine (ZYRTEC) 10 MG tablet Take 10 mg by mouth at bedtime.   . Cholecalciferol (VITAMIN D3) 2000 units TABS Take 2,000 Units by mouth daily.   . ferrous sulfate 325 (65 FE) MG tablet Take 325 mg by mouth daily with breakfast.  . Multiple Vitamin (MULTIVITAMIN) tablet Take 1 tablet by mouth at bedtime.   . potassium chloride SA (K-DUR,KLOR-CON) 20 MEQ tablet Take 1 tablet (20 mEq total) by mouth daily.  . Probiotic Product (PROBIOTIC DAILY PO) Take by mouth.   . sertraline (ZOLOFT) 100 MG tablet 1 (ONE) TABLET, ORAL, AT BEDTIME DAILY BY MOUTH  . SPIRIVA HANDIHALER 18 MCG inhalation capsule INHALE 1 CAPSULE ONCE A DAY  . SYMBICORT 160-4.5 MCG/ACT inhaler INHALE 2 PUFFS INTO THE LUNGS 2 (TWO) TIMES DAILY.  . vitamin E 400 UNIT capsule Take 400 Units by mouth daily.  Marland Kitchen HYDROcodone-acetaminophen (NORCO/VICODIN) 5-325 MG tablet Take 1 tablet by mouth every 6 (six) hours as needed for moderate pain. (Patient not taking: Reported on 04/23/2018)  .  traMADol (ULTRAM) 50 MG tablet Take 1 tablet (50 mg total) by mouth every 8 (eight) hours as needed. (Patient not taking: Reported on 04/23/2018)   No facility-administered encounter medications on file as of 04/23/2018.     Activities of Daily Living In your present state of health, do you have any difficulty performing the following activities: 04/23/2018 01/19/2018  Hearing? N Y  Vision? Jeannie Fend  Y  Comment Needs new presciption, pt plans to set up an appointment this year.  -  Difficulty concentrating or making decisions? N N  Walking or climbing stairs? Y N  Comment Due to SOB and unsteadiness.  -  Dressing or bathing? N N  Doing errands, shopping? N N  Preparing Food and eating ? N -  Using the Toilet? N -  In the past six months, have you accidently leaked urine? Y -  Comment Yes, wears protection daily.  -  Do you have problems with loss of bowel control? N -  Managing your Medications? N -  Managing your Finances? N -  Housekeeping or managing your Housekeeping? N -  Some recent data might be hidden    Patient Care Team: Chrismon, Jodell Cipro, PA as PCP - General (Physician Assistant) Wyn Quaker Marlow Baars, MD as Referring Physician (Vascular Surgery) Fredderick Erb, MD as Consulting Physician (Orthopedic Surgery)    Assessment:   This is a routine wellness examination for Hailey Gibbs.  Exercise Activities and Dietary recommendations Current Exercise Habits: The patient does not participate in regular exercise at present, Exercise limited by: respiratory conditions(s);Other - see comments(no energy)  Goals    . DIET - INCREASE WATER INTAKE     Recommend increasing water intake to 4-6 glasses a day.        Fall Risk Fall Risk  04/23/2018 01/14/2018 04/12/2017  Falls in the past year? Yes Yes Yes  Number falls in past yr: 2 or more 1 2 or more  Injury with Fall? No No Yes  Comment - - broke toe  Risk Factor Category  - - High Fall Risk  Risk for fall due to : - - History of  fall(s)  Follow up Falls prevention discussed - Falls prevention discussed   Is the patient's home free of loose throw rugs in walkways, pet beds, electrical cords, etc?   yes      Grab bars in the bathroom? no      Handrails on the stairs?   no      Adequate lighting?   yes  Timed Get Up and Go performed: N/A  Depression Screen PHQ 2/9 Scores 04/23/2018 01/14/2018 04/12/2017 04/12/2017  PHQ - 2 Score 1 0 0 0  PHQ- 9 Score - Cognitive Function: Pt declined screening today.      6CIT Screen 04/12/2017  What Year? 0 points  What month? 0 points  What time? 0 points  Count back from 20 0 points  Months in reverse 0 points  Repeat phrase 4 points  Total Score 4    Immunization History  Administered Date(s) Administered  . Pneumococcal Conjugate-13 04/12/2017  . Pneumococcal Polysaccharide-23 04/23/2018  . Tdap 03/14/2018    Qualifies for Shingles Vaccine? Due for Shingles vaccine. Declined my offer to administer today. Education has been provided regarding the importance of this vaccine. Pt has been advised to call her insurance company to determine her out of pocket expense. Advised she may also receive this vaccine at her local pharmacy or Health Dept. Verbalized acceptance and understanding.  Screening Tests Health Maintenance  Topic Date Due  . DEXA SCAN  05/05/2012  . INFLUENZA VACCINE  09/16/2018 (Originally 07/10/2018)  . MAMMOGRAM  01/14/2019 (Originally 05/05/1997)  . COLONOSCOPY  12/10/2026 (Originally 05/05/1997)  . TETANUS/TDAP  03/14/2028  . Hepatitis C Screening  Completed  . PNA vac Low Risk Adult  Completed    Cancer  Screenings: Lung: Low Dose CT Chest recommended if Age 55-80 years, 30 pack-year currently smoking OR have quit w/in 15years. Patient does not qualify due to completing a CT 12/2017. Breast:  Up to date on Mammogram? No, pt declines completing this.  Up to date of Bone Density/Dexa? No, pt declines the order for this. Colorectal: Pt declines  referral today.   Additional Screenings:  Hepatitis C Screening: N/A     Plan:  I have personally reviewed and addressed the Medicare Annual Wellness questionnaire and have noted the following in the patient's chart:  A. Medical and social history B. Use of alcohol, tobacco or illicit drugs  C. Current medications and supplements D. Functional ability and status E.  Nutritional status F.  Physical activity G. Advance directives H. List of other physicians I.  Hospitalizations, surgeries, and ER visits in previous 12 months J.  Vitals K. Screenings such as hearing and vision if needed, cognitive and depression L. Referrals and appointments - none  In addition, I have reviewed and discussed with patient certain preventive protocols, quality metrics, and best practice recommendations. A written personalized care plan for preventive services as well as general preventive health recommendations were provided to patient.  See attached scanned questionnaire for additional information.   Signed,  Hyacinth Meeker, LPN Nurse Health Advisor   Nurse Recommendations: Pt declined the DEXA scan order, colonoscopy referral and setting up a mammogram this year. Pt would like to have her gallbladder removed before having any additional screenings.   Reviewed documentation and recommendations of the Nurse Health Advisor screening. Was available for consultation and agree with plan.

## 2018-04-23 NOTE — Telephone Encounter (Signed)
Pt was seen for AWV today and wanted to ask Maurine Minister when she will get a referral to see the surgeon to have her gallbladder removed? Pt states previously she was told to wait on this due to a dog bite and her electrolytes were off, but pt states everything is back to normal and she is ready to go through with this. Please advise, thank you. -MM

## 2018-04-24 NOTE — Telephone Encounter (Signed)
She has seen Dr. Excell Seltzer (general surgeon) regarding this issue and should not need referral to go back and discuss options or surgery.

## 2018-04-30 DIAGNOSIS — J449 Chronic obstructive pulmonary disease, unspecified: Secondary | ICD-10-CM | POA: Diagnosis not present

## 2018-05-01 DIAGNOSIS — S61452D Open bite of left hand, subsequent encounter: Secondary | ICD-10-CM | POA: Diagnosis not present

## 2018-05-02 ENCOUNTER — Other Ambulatory Visit: Payer: Self-pay | Admitting: Family Medicine

## 2018-05-02 DIAGNOSIS — J441 Chronic obstructive pulmonary disease with (acute) exacerbation: Secondary | ICD-10-CM

## 2018-05-21 ENCOUNTER — Ambulatory Visit: Payer: Medicare HMO | Admitting: Surgery

## 2018-05-21 ENCOUNTER — Encounter: Payer: Self-pay | Admitting: Surgery

## 2018-05-21 VITALS — BP 94/64 | HR 106 | Ht 63.0 in | Wt 123.0 lb

## 2018-05-21 DIAGNOSIS — K8020 Calculus of gallbladder without cholecystitis without obstruction: Secondary | ICD-10-CM | POA: Diagnosis not present

## 2018-05-21 NOTE — Patient Instructions (Signed)
Return as needed.The patient is aware to call back for any questions or concerns.  

## 2018-05-21 NOTE — Progress Notes (Signed)
Outpatient Surgical Follow Up  05/21/2018  Hailey Gibbs is an 72 y.o. female.   YN:[id number]:gallstones  HPI: Patient seen in March and told that she could not have surgery due to her low saturation levels even on supplemental oxygen with a recent pneumonia.  She describes recent right upper quadrant pain lasting 2 hours but only 2 episodes in the last 4 months.  She is had no jaundice or acholic stools no fevers or chills.  The pain goes away spontaneously. The patient continues to smoke a half a pack of cigarettes per day Past Medical History:  Diagnosis Date  . Abnormal chest sounds 08/29/2015  . Acute respiratory failure (HCC)   . Anxiety   . BP (high blood pressure) 08/29/2015  . Carotid artery occlusion   . Carotid stenosis 06/23/2015  . COPD (chronic obstructive pulmonary disease) (HCC)   . COPD exacerbation (HCC) 10/26/2016  . Depression   . GERD (gastroesophageal reflux disease)   . Hyperlipidemia   . Hypertension   . Obstructive chronic bronchitis with exacerbation (HCC) 08/29/2015  . Palliative care by specialist   . Seasonal allergies   . Shortness of breath dyspnea     Past Surgical History:  Procedure Laterality Date  . ENDARTERECTOMY Left 06/23/2015   Procedure: ENDARTERECTOMY CAROTID;  Surgeon: Annice NeedyJason S Dew, MD;  Location: [facility] ORS;  Service: Vascular;  Laterality: Left;  . TUBAL LIGATION    . VAGINAL HYSTERECTOMY      Family History  Problem Relation Age of Onset  . Heart disease Father [id number]       CABG   . Hyperlipidemia Father   . Pneumonia Father   . Dementia Mother   . COPD Mother   . Diabetes Sister   . Parkinson's disease Brother   . Post-traumatic stress disorder Son     Social History:  reports that she has been smoking cigarettes.  She has a 22.50 pack-year smoking history. She has never used smokeless tobacco. She reports that she does not drink alcohol or use drugs.  Allergies:  Allergies  Allergen Reactions  . Codeine Anxiety          Medications reviewed.   Review of Systems:   Review of Systems  Constitutional: Negative.   HENT: Negative.   Eyes: Negative.   Respiratory: Positive for shortness of breath and wheezing.   Cardiovascular: Negative.   Gastrointestinal: Positive for abdominal pain. Negative for nausea and vomiting.  Genitourinary: Negative.   Musculoskeletal: Negative.   Skin: Negative.   Neurological: Negative.   Endo/Heme/Allergies: Negative.   Psychiatric/Behavioral: Negative.      Physical Exam:  BP 94/64   Pulse (!) 106   Ht 5\' 3"  (1.6 m)   Wt 123 lb (55.8 kg)   SpO2 98%   BMI 21.79 kg/m   Physical Exam  Constitutional: No distress.  Wheelchair-bound on supplemental oxygen very lordotic chest  HENT:  Head: Normocephalic and atraumatic.  Eyes: Pupils are equal, round, and reactive to light. Right eye exhibits no discharge. Left eye exhibits no discharge. No scleral icterus.  Neck: Normal range of motion.  Cardiovascular: Normal rate and regular rhythm.  Pulmonary/Chest: No respiratory distress. She has wheezes. She has no rales.  Abdominal: Soft. She exhibits no distension and no mass. There is no tenderness. There is no guarding.  Musculoskeletal: She exhibits edema.  Skin: Skin is warm. No erythema. No pallor.  Vitals reviewed.     No results found for this or any previous visit (from  the past 48 hour(s)). No results found.  Assessment/Plan:  This a patient with known gallstones his attacks are mild and infrequent but she has severe COPD and continues to smoke while on supplemental oxygen.  She is not a surgical candidate and I would not consider surgical intervention.  At best she would receive a cholecystostomy tube should she have reached the point of having a septic gallbladder of which she has no sign of at this time.  Follow-up on an as-needed basis  Lattie Haw, MD, FACS

## 2018-05-23 DIAGNOSIS — J449 Chronic obstructive pulmonary disease, unspecified: Secondary | ICD-10-CM | POA: Diagnosis not present

## 2018-05-31 DIAGNOSIS — J449 Chronic obstructive pulmonary disease, unspecified: Secondary | ICD-10-CM | POA: Diagnosis not present

## 2018-06-02 ENCOUNTER — Other Ambulatory Visit: Payer: Self-pay | Admitting: Family Medicine

## 2018-06-16 ENCOUNTER — Other Ambulatory Visit: Payer: Self-pay | Admitting: Family Medicine

## 2018-06-16 DIAGNOSIS — E876 Hypokalemia: Secondary | ICD-10-CM

## 2018-06-22 DIAGNOSIS — J449 Chronic obstructive pulmonary disease, unspecified: Secondary | ICD-10-CM | POA: Diagnosis not present

## 2018-06-26 ENCOUNTER — Encounter: Payer: Self-pay | Admitting: Family Medicine

## 2018-06-30 DIAGNOSIS — J449 Chronic obstructive pulmonary disease, unspecified: Secondary | ICD-10-CM | POA: Diagnosis not present

## 2018-07-05 ENCOUNTER — Other Ambulatory Visit: Payer: Self-pay | Admitting: Family Medicine

## 2018-07-05 DIAGNOSIS — F419 Anxiety disorder, unspecified: Principal | ICD-10-CM

## 2018-07-05 DIAGNOSIS — F329 Major depressive disorder, single episode, unspecified: Secondary | ICD-10-CM

## 2018-07-05 DIAGNOSIS — F32A Depression, unspecified: Secondary | ICD-10-CM

## 2018-07-07 ENCOUNTER — Encounter: Payer: Medicare HMO | Admitting: Family Medicine

## 2018-07-12 ENCOUNTER — Other Ambulatory Visit: Payer: Self-pay | Admitting: Family Medicine

## 2018-07-22 ENCOUNTER — Other Ambulatory Visit: Payer: Self-pay | Admitting: Family Medicine

## 2018-07-22 DIAGNOSIS — J441 Chronic obstructive pulmonary disease with (acute) exacerbation: Secondary | ICD-10-CM

## 2018-07-23 DIAGNOSIS — J449 Chronic obstructive pulmonary disease, unspecified: Secondary | ICD-10-CM | POA: Diagnosis not present

## 2018-07-31 DIAGNOSIS — J449 Chronic obstructive pulmonary disease, unspecified: Secondary | ICD-10-CM | POA: Diagnosis not present

## 2018-08-08 ENCOUNTER — Ambulatory Visit (INDEPENDENT_AMBULATORY_CARE_PROVIDER_SITE_OTHER): Payer: Medicare HMO | Admitting: Family Medicine

## 2018-08-08 ENCOUNTER — Encounter: Payer: Self-pay | Admitting: Family Medicine

## 2018-08-08 VITALS — BP 104/62 | HR 98 | Temp 98.0°F | Wt 123.0 lb

## 2018-08-08 DIAGNOSIS — F329 Major depressive disorder, single episode, unspecified: Secondary | ICD-10-CM

## 2018-08-08 DIAGNOSIS — I10 Essential (primary) hypertension: Secondary | ICD-10-CM

## 2018-08-08 DIAGNOSIS — F419 Anxiety disorder, unspecified: Secondary | ICD-10-CM | POA: Diagnosis not present

## 2018-08-08 DIAGNOSIS — Z13820 Encounter for screening for osteoporosis: Secondary | ICD-10-CM

## 2018-08-08 DIAGNOSIS — E782 Mixed hyperlipidemia: Secondary | ICD-10-CM

## 2018-08-08 DIAGNOSIS — J439 Emphysema, unspecified: Secondary | ICD-10-CM

## 2018-08-08 DIAGNOSIS — F32A Depression, unspecified: Secondary | ICD-10-CM

## 2018-08-08 DIAGNOSIS — R0989 Other specified symptoms and signs involving the circulatory and respiratory systems: Secondary | ICD-10-CM

## 2018-08-08 NOTE — Progress Notes (Signed)
Patient: Hailey Gibbs, Female    DOB: 1946/04/03, 72 y.o.   MRN: [id number] Visit Date: 08/08/2018  Today's Provider: Dortha Kernennis Orlando Devereux, PA   Chief Complaint  Patient presents with  . Medicare Wellness   Subjective:     Complete Physical Hailey Gibbs is a 72 y.o. female. She feels well. She reports exercising some. She reports she is sleeping poorly.  -----------------------------------------------------------   Review of Systems  Constitutional: Positive for unexpected weight change. Negative for activity change, appetite change, chills, diaphoresis, fatigue and fever.  HENT: Negative.   Eyes: Negative.   Respiratory: Positive for shortness of breath. Negative for apnea, cough, choking, chest tightness, wheezing and stridor.   Cardiovascular: Negative.   Gastrointestinal: Positive for constipation. Negative for abdominal distention, abdominal pain, anal bleeding, blood in stool, diarrhea, nausea, rectal pain and vomiting.  Endocrine: Negative.   Genitourinary: Negative.   Musculoskeletal: Negative.   Skin: Negative.   Allergic/Immunologic: Negative.   Neurological: Negative.   Hematological: Negative.   Psychiatric/Behavioral: Negative for agitation, behavioral problems, confusion, decreased concentration, dysphoric mood, hallucinations, self-injury, sleep disturbance and suicidal ideas. The patient is hyperactive. The patient is not nervous/anxious.     Social History   Socioeconomic History  . Marital status: Divorced    Spouse name: Not on file  . Number of children: 2  . Years of education: Not on file  . Highest education level: 12th grade  Occupational History  . Occupation: retired  Engineer, productionocial Needs  . Financial resource strain: Not hard at all  . Food insecurity:    Worry: Never true    Inability: Never true  . Transportation needs:    Medical: No    Non-medical: No  Tobacco Use  . Smoking status: Current Every Day Smoker    Packs/day:  0.50    Years: 45.00    Pack years: 22.50    Types: Cigarettes  . Smokeless tobacco: Never Used  . Tobacco comment: give our take 0-1 pack a day / uses patch PRN  Substance and Sexual Activity  . Alcohol use: No    Alcohol/week: 0.0 standard drinks  . Drug use: No  . Sexual activity: Not on file  Lifestyle  . Physical activity:    Days per week: Not on file    Minutes per session: Not on file  . Stress: Only a little  Relationships  . Social connections:    Talks on phone: Not on file    Gets together: Not on file    Attends religious service: Not on file    Active member of club or organization: Not on file    Attends meetings of clubs or organizations: Not on file    Relationship status: Not on file  . Intimate partner violence:    Fear of current or ex partner: Not on file    Emotionally abused: Not on file    Physically abused: Not on file    Forced sexual activity: Not on file  Other Topics Concern  . Not on file  Social History Narrative  . Not on file    Past Medical History:  Diagnosis Date  . Abnormal chest sounds 08/29/2015  . Acute respiratory failure (HCC)   . Anxiety   . BP (high blood pressure) 08/29/2015  . Carotid artery occlusion   . Carotid stenosis 06/23/2015  . COPD (chronic obstructive pulmonary disease) (HCC)   . COPD exacerbation (HCC) 10/26/2016  . Depression   .  GERD (gastroesophageal reflux disease)   . Hyperlipidemia   . Hypertension   . Obstructive chronic bronchitis with exacerbation (HCC) 08/29/2015  . Palliative care by specialist   . Seasonal allergies   . Shortness of breath dyspnea      Patient Active Problem List   Diagnosis Date Noted  . Acute respiratory failure (HCC)   . Palliative care by specialist   . UTI (urinary tract infection) 01/17/2018  . Sepsis (HCC) 11/07/2016  . COPD exacerbation (HCC) 10/26/2016  . Abnormal chest sounds 08/29/2015  . Obstructive chronic bronchitis with exacerbation (HCC) 08/29/2015  .  Anxiety and depression 08/29/2015  . BP (high blood pressure) 08/29/2015  . Carotid stenosis 06/23/2015  . DNR (do not resuscitate) discussion 05/30/2015  . Tobacco use 05/30/2015  . Hyperlipidemia     Past Surgical History:  Procedure Laterality Date  . ENDARTERECTOMY Left 06/23/2015   Procedure: ENDARTERECTOMY CAROTID;  Surgeon: Annice Needy, MD;  Location: [facility] ORS;  Service: Vascular;  Laterality: Left;  . TUBAL LIGATION    . VAGINAL HYSTERECTOMY     Her family history includes COPD in her mother; Dementia in her mother; Diabetes in her sister; Heart disease (age of onset: 78) in her father; Hyperlipidemia in her father; Parkinson's disease in her brother; Pneumonia in her father; Post-traumatic stress disorder in her son.     Allergies  Allergen Reactions  . Codeine Anxiety         Current Outpatient Medications:  .  acetaminophen (TYLENOL) 500 MG tablet, Take 1,000 mg by mouth every 6 (six) hours as needed for mild pain or headache. , Disp: , Rfl:  .  albuterol (VENTOLIN HFA) 108 (90 Base) MCG/ACT inhaler, INHALE 1-2 PUFFS 4 TIMES A DAY AS NEEDED FOR WHEEZING, Disp: 18 Inhaler, Rfl: 2 .  amLODipine (NORVASC) 10 MG tablet, TAKE 1 TABLET BY MOUTH EVERY DAY, Disp: 90 tablet, Rfl: 3 .  aspirin 81 MG chewable tablet, Chew 81 mg by mouth at bedtime. , Disp: , Rfl:  .  atorvastatin (LIPITOR) 10 MG tablet, TAKE 1 TABLET BY MOUTH EVERY DAY, Disp: 90 tablet, Rfl: 3 .  B Complex-C-Folic Acid (HM SUPER VITAMIN B COMPLEX/C PO), Take 1 tablet by mouth daily. , Disp: , Rfl:  .  calcium carbonate (OSCAL) 1500 (600 Ca) MG TABS tablet, Take 600 mg of elemental calcium by mouth daily with breakfast., Disp: , Rfl:  .  cetirizine (ZYRTEC) 10 MG tablet, Take 10 mg by mouth at bedtime. , Disp: , Rfl:  .  Cholecalciferol (VITAMIN D3) 2000 units TABS, Take 2,000 Units by mouth daily. , Disp: , Rfl:  .  ferrous sulfate 325 (65 FE) MG tablet, Take 325 mg by mouth daily with breakfast., Disp: , Rfl:  .   HYDROcodone-acetaminophen (NORCO/VICODIN) 5-325 MG tablet, hydrocodone 5 mg-acetaminophen 325 mg tablet, Disp: , Rfl:  .  Multiple Vitamin (MULTIVITAMIN) tablet, Take 1 tablet by mouth at bedtime. , Disp: , Rfl:  .  Probiotic Product (PROBIOTIC DAILY PO), Take by mouth. , Disp: , Rfl:  .  sertraline (ZOLOFT) 100 MG tablet, 1 (ONE) TABLET, ORAL, AT BEDTIME DAILY BY MOUTH, Disp: 90 tablet, Rfl: 3 .  SPIRIVA HANDIHALER 18 MCG inhalation capsule, INHALE 1 CAPSULE ONCE A DAY, Disp: 30 capsule, Rfl: 11 .  SYMBICORT 160-4.5 MCG/ACT inhaler, INHALE 2 PUFFS INTO THE LUNGS 2 (TWO) TIMES DAILY., Disp: 10.2 Inhaler, Rfl: 3 .  vitamin E 400 UNIT capsule, Take 400 Units by mouth daily., Disp: ,  Rfl:  .  potassium chloride SA (K-DUR,KLOR-CON) 20 MEQ tablet, Take 1 tablet (20 mEq total) by mouth daily. (Patient not taking: Reported on 08/08/2018), Disp: 30 tablet, Rfl: 3  Patient Care Team: Peggie Hornak, Jodell Cipro, PA as PCP - General (Physician Assistant) Wyn Quaker, Marlow Baars, MD as Referring Physician (Vascular Surgery) Fredderick Erb, MD as Consulting Physician (Orthopedic Surgery)    Objective:   Vitals: BP 104/62 (BP Location: Right Arm, Patient Position: Sitting, Cuff Size: Normal)   Pulse 98   Temp 98 F (36.7 C) (Oral)   Wt 123 lb (55.8 kg)   SpO2 95%   BMI 21.79 kg/m    Wt Readings from Last 3 Encounters:  08/08/18 123 lb (55.8 kg)  05/21/18 123 lb (55.8 kg)  04/23/18 125 lb 3.2 oz (56.8 kg)   Physical Exam  Constitutional: She is oriented to person, place, and time. She appears well-developed and well-nourished.  HENT:  Head: Normocephalic and atraumatic.  Right Ear: External ear normal.  Left Ear: External ear normal.  Nose: Nose normal.  Mouth/Throat: Oropharynx is clear and moist.  Eyes: Pupils are equal, round, and reactive to light. Conjunctivae and EOM are normal. Right eye exhibits no discharge.  Neck: Normal range of motion. Neck supple. No tracheal deviation present. No  thyromegaly present.  Left carotid bruit.  Cardiovascular: Normal rate, regular rhythm, normal heart sounds and intact distal pulses.  No murmur heard. Pulmonary/Chest: No respiratory distress. She has wheezes. She has no rales. She exhibits no tenderness.  Abdominal: Soft. She exhibits no distension and no mass. There is no tenderness. There is no rebound and no guarding.  Musculoskeletal: Normal range of motion. She exhibits no edema or tenderness.  Lymphadenopathy:    She has no cervical adenopathy.  Neurological: She is alert and oriented to person, place, and time. She has normal reflexes. She displays normal reflexes. No cranial nerve deficit. She exhibits normal muscle tone. Coordination normal.  Skin: Skin is warm and dry. No rash noted. No erythema.  Psychiatric: She has a normal mood and affect. Her behavior is normal. Judgment and thought content normal.    Activities of Daily Living In your present state of health, do you have any difficulty performing the following activities: 08/08/2018 04/23/2018  Hearing? Y N  Vision? Y Y  Comment - Needs new presciption, pt plans to set up an appointment this year.   Difficulty concentrating or making decisions? N N  Walking or climbing stairs? N Y  Comment - Due to SOB and unsteadiness.   Dressing or bathing? N N  Doing errands, shopping? N N  Preparing Food and eating ? - N  Using the Toilet? - N  In the past six months, have you accidently leaked urine? - Y  Comment - Yes, wears protection daily.   Do you have problems with loss of bowel control? - N  Managing your Medications? - N  Managing your Finances? - N  Housekeeping or managing your Housekeeping? - N  Some recent data might be hidden   Fall Risk Assessment Fall Risk  08/08/2018 04/23/2018 01/14/2018 04/12/2017  Falls in the past year? No Yes Yes Yes  Number falls in past yr: - 2 or more 1 2 or more  Injury with Fall? - No No Yes  Comment - - - broke toe  Risk Factor  Category  - - - High Fall Risk  Risk for fall due to : - - - History of fall(s)  Follow  up - Falls prevention discussed - Falls prevention discussed   Depression Screen PHQ 2/9 Scores 08/08/2018 04/23/2018 01/14/2018 04/12/2017  PHQ - 2 Score 1 1 0 0  PHQ- 9 Score 9 - 3 2   Assessment & Plan:    Annual Physical Reviewed patient's Family Medical History Reviewed and updated list of patient's medical providers Assessment of cognitive impairment was done Assessed patient's functional ability Established a written schedule for health screening services Health Risk Assessent Completed and Reviewed  Exercise Activities and Dietary recommendations Goals    . DIET - INCREASE WATER INTAKE     Recommend increasing water intake to 4-6 glasses a day.     . Have 3 meals a day     Recommend eating 3 small meals a day with two healthy snack in between.    . Increase water intake     Recommend increasing eater intake to 2-3 cups a day.        Immunization History  Administered Date(s) Administered  . Pneumococcal Conjugate-13 04/12/2017  . Pneumococcal Polysaccharide-23 04/23/2018  . Tdap 03/14/2018    Health Maintenance  Topic Date Due  . DEXA SCAN  05/05/2012  . INFLUENZA VACCINE  09/16/2018 (Originally 07/10/2018)  . MAMMOGRAM  01/14/2019 (Originally 05/05/1997)  . COLONOSCOPY  12/10/2026 (Originally 05/05/1997)  . TETANUS/TDAP  03/14/2028  . Hepatitis C Screening  Completed  . PNA vac Low Risk Adult  Completed    Discussed health benefits of physical activity, and encouraged her to engage in regular exercise appropriate for her age and condition.    ------------------------------------------------------------------------------------------------------------ 1. Essential hypertension Tolerating the Amlodipine 10 mg qd without palpitations, chest pains or peripheral edema. Medicare Wellness Screening on 04-23-18 was reviewed with patient. Reports she went to see her surgeon (Dr. Excell Seltzer)  regarding cholelithiasis and he did not recommend surgery with her very poor pulmonary status. States he suggested she limit fats in diet and keep the Hydrocodone handy to take if any acute GB attacks occur. Will get routine labs and schedule BMD. - CBC with Differential/Platelet - Comprehensive metabolic panel - TSH  2. Mixed hyperlipidemia Still taking the Atorvastatin 10 mg qd. Recheck CMP, Lipid Panel and TSH. - Comprehensive metabolic panel - Lipid panel - TSH  3. Anxiety and depression Feels family living with her is more of a stress than help. No sure her Zoloft 100 mg qd is enough in this situation. States the family members are moving out of her home and that should help with "nerves". Not eating much but still taking vitamins with iron. Will check labs and follow up pending report. - CBC with Differential/Platelet - Comprehensive metabolic panel  4. Pulmonary emphysema, unspecified emphysema type (HCC) Continues to use oxygen by nasal cannula at 2 LPM 24 hours a day. Pulse oximetry 90% walking into the office with oxygen on and increased to 95% after sitting down for 5-6 minutes with oxygen. Continues to use Spiriva and Symbicort daily. Rarely uses Ventolin. Advised to use it today as wheezing audible. No fever, cough or sputum production at the present.  5. Screening for osteoporosis Rarely smoking now but not eating well or taking Caltrate supplement. Due for BMD.  6. Left carotid bruit History of left carotid endarterectomy in 2016 by Dr. Wyn Quaker (vascular surgeon). Due for follow up doppler studies and bruit still present on the left. Patient states she was contacted by his office to schedule follow up appointment.    Dortha Kern, PA  [facility]

## 2018-08-09 LAB — COMPREHENSIVE METABOLIC PANEL
ALT: 12 IU/L (ref 0–32)
AST: 14 IU/L (ref 0–40)
Albumin/Globulin Ratio: 1.7 (ref 1.2–2.2)
Albumin: 4.4 g/dL (ref 3.5–4.8)
Alkaline Phosphatase: 95 IU/L (ref 39–117)
BUN/Creatinine Ratio: 15 (ref 12–28)
BUN: 8 mg/dL (ref 8–27)
Bilirubin Total: 0.3 mg/dL (ref 0.0–1.2)
CALCIUM: 10.4 mg/dL — AB (ref 8.7–10.3)
CO2: 36 mmol/L — ABNORMAL HIGH (ref 20–29)
CREATININE: 0.52 mg/dL — AB (ref 0.57–1.00)
Chloride: 90 mmol/L — ABNORMAL LOW (ref 96–106)
GFR calc Af Amer: 111 mL/min/{1.73_m2} (ref >59)
GFR, EST NON AFRICAN AMERICAN: 96 mL/min/{1.73_m2} (ref >59)
GLOBULIN, TOTAL: 2.6 g/dL (ref 1.5–4.5)
GLUCOSE: 105 mg/dL — AB (ref 65–99)
Potassium: 4.5 mmol/L (ref 3.5–5.2)
SODIUM: 141 mmol/L (ref 134–144)
Total Protein: 7 g/dL (ref 6.0–8.5)

## 2018-08-09 LAB — CBC WITH DIFFERENTIAL/PLATELET
BASOS ABS: 0.1 10*3/uL (ref 0.0–0.2)
BASOS: 0 %
EOS (ABSOLUTE): 0.1 10*3/uL (ref 0.0–0.4)
Eos: 1 %
HEMOGLOBIN: 11.3 g/dL (ref 11.1–15.9)
Hematocrit: 35.4 % (ref 34.0–46.6)
Immature Grans (Abs): 0 10*3/uL (ref 0.0–0.1)
Immature Granulocytes: 0 %
LYMPHS ABS: 2 10*3/uL (ref 0.7–3.1)
Lymphs: 15 %
MCH: 30.4 pg (ref 26.6–33.0)
MCHC: 31.9 g/dL (ref 31.5–35.7)
MCV: 95 fL (ref 79–97)
Monocytes Absolute: 0.9 10*3/uL (ref 0.1–0.9)
Monocytes: 7 %
Neutrophils Absolute: 10 10*3/uL — ABNORMAL HIGH (ref 1.4–7.0)
Neutrophils: 77 %
Platelets: 324 10*3/uL (ref 150–450)
RBC: 3.72 x10E6/uL — ABNORMAL LOW (ref 3.77–5.28)
RDW: 11.4 % — ABNORMAL LOW (ref 12.3–15.4)
WBC: 13.1 10*3/uL — ABNORMAL HIGH (ref 3.4–10.8)

## 2018-08-09 LAB — LIPID PANEL
CHOL/HDL RATIO: 2.2 ratio (ref 0.0–4.4)
CHOLESTEROL TOTAL: 153 mg/dL (ref 100–199)
HDL: 71 mg/dL (ref >39)
LDL CALC: 59 mg/dL (ref 0–99)
TRIGLYCERIDES: 113 mg/dL (ref 0–149)
VLDL Cholesterol Cal: 23 mg/dL (ref 5–40)

## 2018-08-09 LAB — TSH: TSH: 2.88 u[IU]/mL (ref 0.450–4.500)

## 2018-08-12 ENCOUNTER — Telehealth: Payer: Self-pay

## 2018-08-12 DIAGNOSIS — Z780 Asymptomatic menopausal state: Secondary | ICD-10-CM

## 2018-08-12 NOTE — Telephone Encounter (Signed)
Order placed.  Please refer.  Thanks,   -Laura  

## 2018-08-12 NOTE — Telephone Encounter (Signed)
-----   Message from Tamsen Roers, [place] sent at 08/11/2018  8:58 PM EDT ----- Lipids all normal. Chloride low and CO2 high because of emphysema/COPD. No anemia and WBC count improving. If any fever or signs of infection, may need antibiotic. Recheck in 3 months.

## 2018-08-12 NOTE — Telephone Encounter (Signed)
Pt advised.  She states she is due for a Bone Density screening.  Is it okay to order?  Thanks,   -Vernona Rieger

## 2018-08-12 NOTE — Telephone Encounter (Signed)
Please schedule bone density test

## 2018-08-23 DIAGNOSIS — J449 Chronic obstructive pulmonary disease, unspecified: Secondary | ICD-10-CM | POA: Diagnosis not present

## 2018-08-31 DIAGNOSIS — J449 Chronic obstructive pulmonary disease, unspecified: Secondary | ICD-10-CM | POA: Diagnosis not present

## 2018-09-01 ENCOUNTER — Ambulatory Visit
Admission: RE | Admit: 2018-09-01 | Discharge: 2018-09-01 | Disposition: A | Discharge status: Home / Self Care | Payer: Medicare HMO | Source: Ambulatory Visit | Attending: Family Medicine | Admitting: Family Medicine

## 2018-09-01 ENCOUNTER — Telehealth: Payer: Self-pay

## 2018-09-01 DIAGNOSIS — M810 Age-related osteoporosis without current pathological fracture: Secondary | ICD-10-CM | POA: Insufficient documentation

## 2018-09-01 DIAGNOSIS — Z13820 Encounter for screening for osteoporosis: Secondary | ICD-10-CM | POA: Insufficient documentation

## 2018-09-01 MED ORDER — ALENDRONATE SODIUM 70 MG PO TABS: 70.0000 mg | ORAL_TABLET | ORAL | 4 refills | Status: DC

## 2018-09-01 NOTE — Telephone Encounter (Signed)
-----   Message from Tamsen Roersennis E Chrismon, [place] sent at 09/01/2018 12:35 PM EDT ----- Bone density test shows severe osteoporosis of the back and both hips. Need Alendronate 70 mg one tablet by mouth once a week 1 hour before eating - don't lie down for 1 hour. #12 & 4RF. Recheck BMD in 2 years. Take Calcium 600 mg BID and Vitamin-D 2000 IU daily.

## 2018-09-01 NOTE — Telephone Encounter (Signed)
PATIENT advised. KW

## 2018-09-12 ENCOUNTER — Ambulatory Visit (INDEPENDENT_AMBULATORY_CARE_PROVIDER_SITE_OTHER): Payer: Medicare HMO

## 2018-09-12 ENCOUNTER — Ambulatory Visit (INDEPENDENT_AMBULATORY_CARE_PROVIDER_SITE_OTHER): Payer: Medicare HMO | Admitting: Vascular Surgery

## 2018-09-16 ENCOUNTER — Encounter (INDEPENDENT_AMBULATORY_CARE_PROVIDER_SITE_OTHER): Payer: Medicare HMO

## 2018-09-16 ENCOUNTER — Ambulatory Visit (INDEPENDENT_AMBULATORY_CARE_PROVIDER_SITE_OTHER): Payer: Medicare HMO | Admitting: Nurse Practitioner

## 2018-09-16 IMAGING — CR DG CHEST 2V
1 series · 2 of 2 positions shown · non-contrast
Comparison: 12/18/2017, CT 12/19/2017

CLINICAL DATA: Follow-up left lower lobe pneumonia

EXAM:
CHEST  2 VIEW

[Series 1: dg chest 2 view · 0.14mm/px · 2 of 2 slices shown]
[im 1/2]
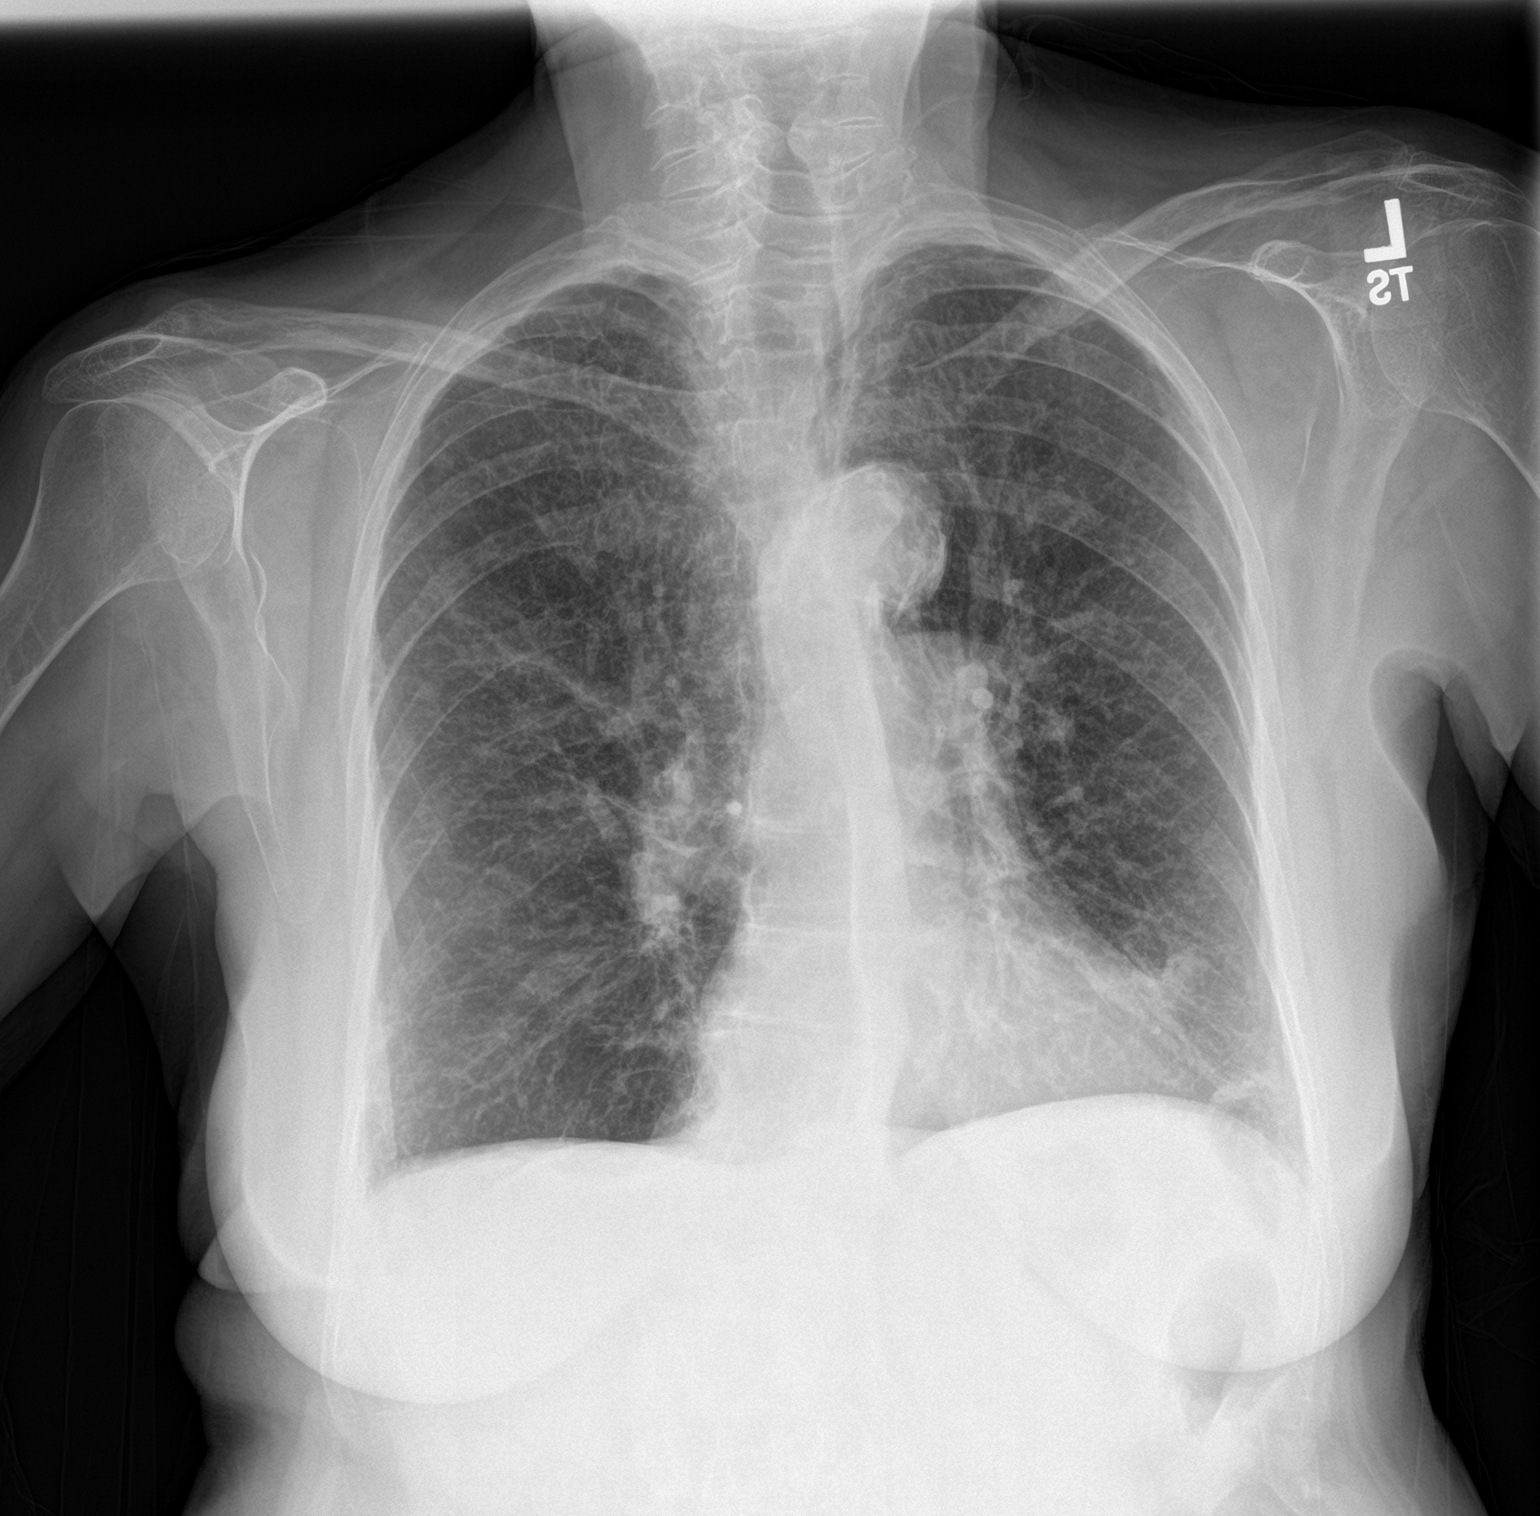
[im 2/2]
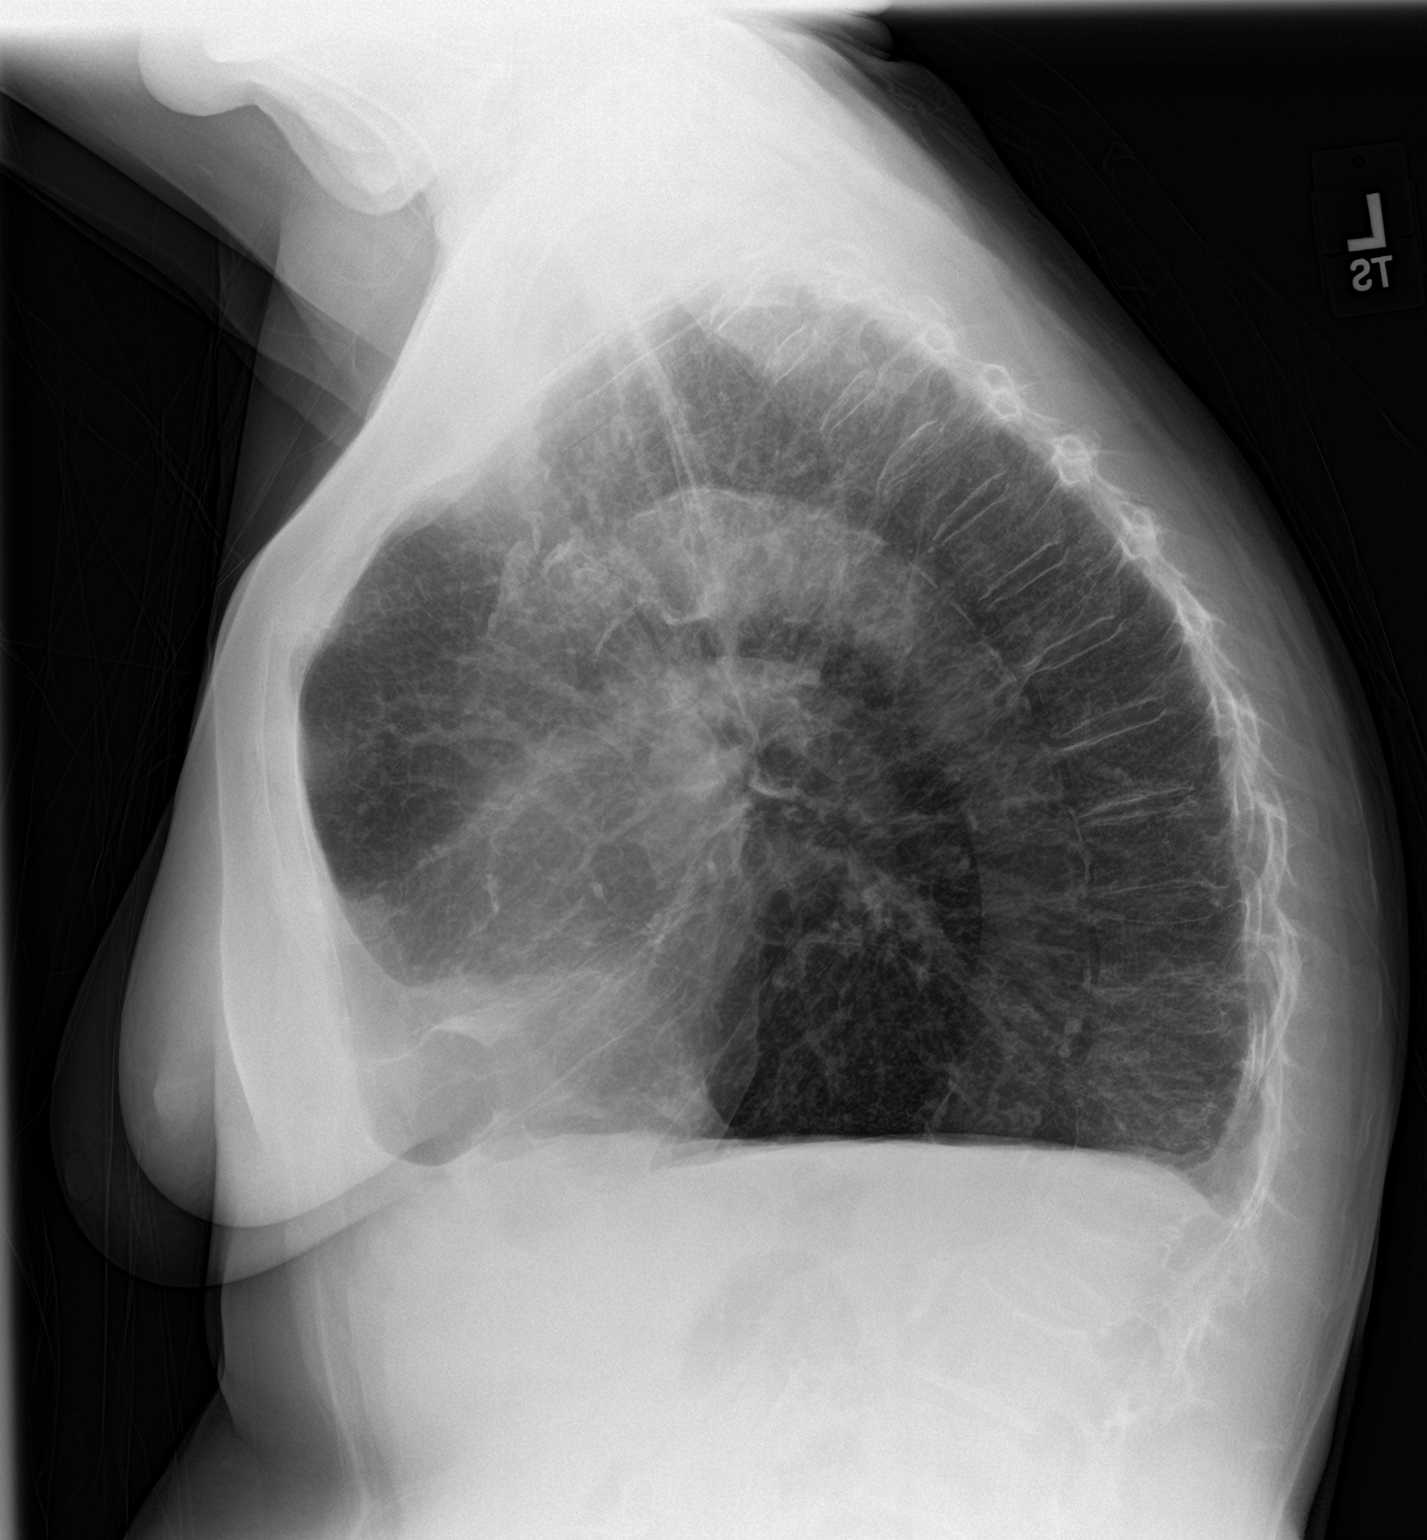

[2 of 2 positions shown; findings below may reference images not displayed]

FINDINGS: Improving opacity at the left lung base. Linear densities likely
reflect lingular scarring. There is hyperinflation of the lungs
compatible with COPD. Heart is normal size. Tortuosity of the aorta
which is calcified. No focal opacity on the right. No effusions or
acute bony abnormality.
IMPRESSION: Improving left basilar infiltrate. Residual lingular scarring. COPD.
No active disease.

## 2018-09-21 IMAGING — DX DG CHEST 1V PORT
1 series · 1 of 1 positions shown · non-contrast
Comparison: 01/17/2018 and prior exams

CLINICAL DATA: Respiratory failure

EXAM:
PORTABLE CHEST 1 VIEW

[chest ap]
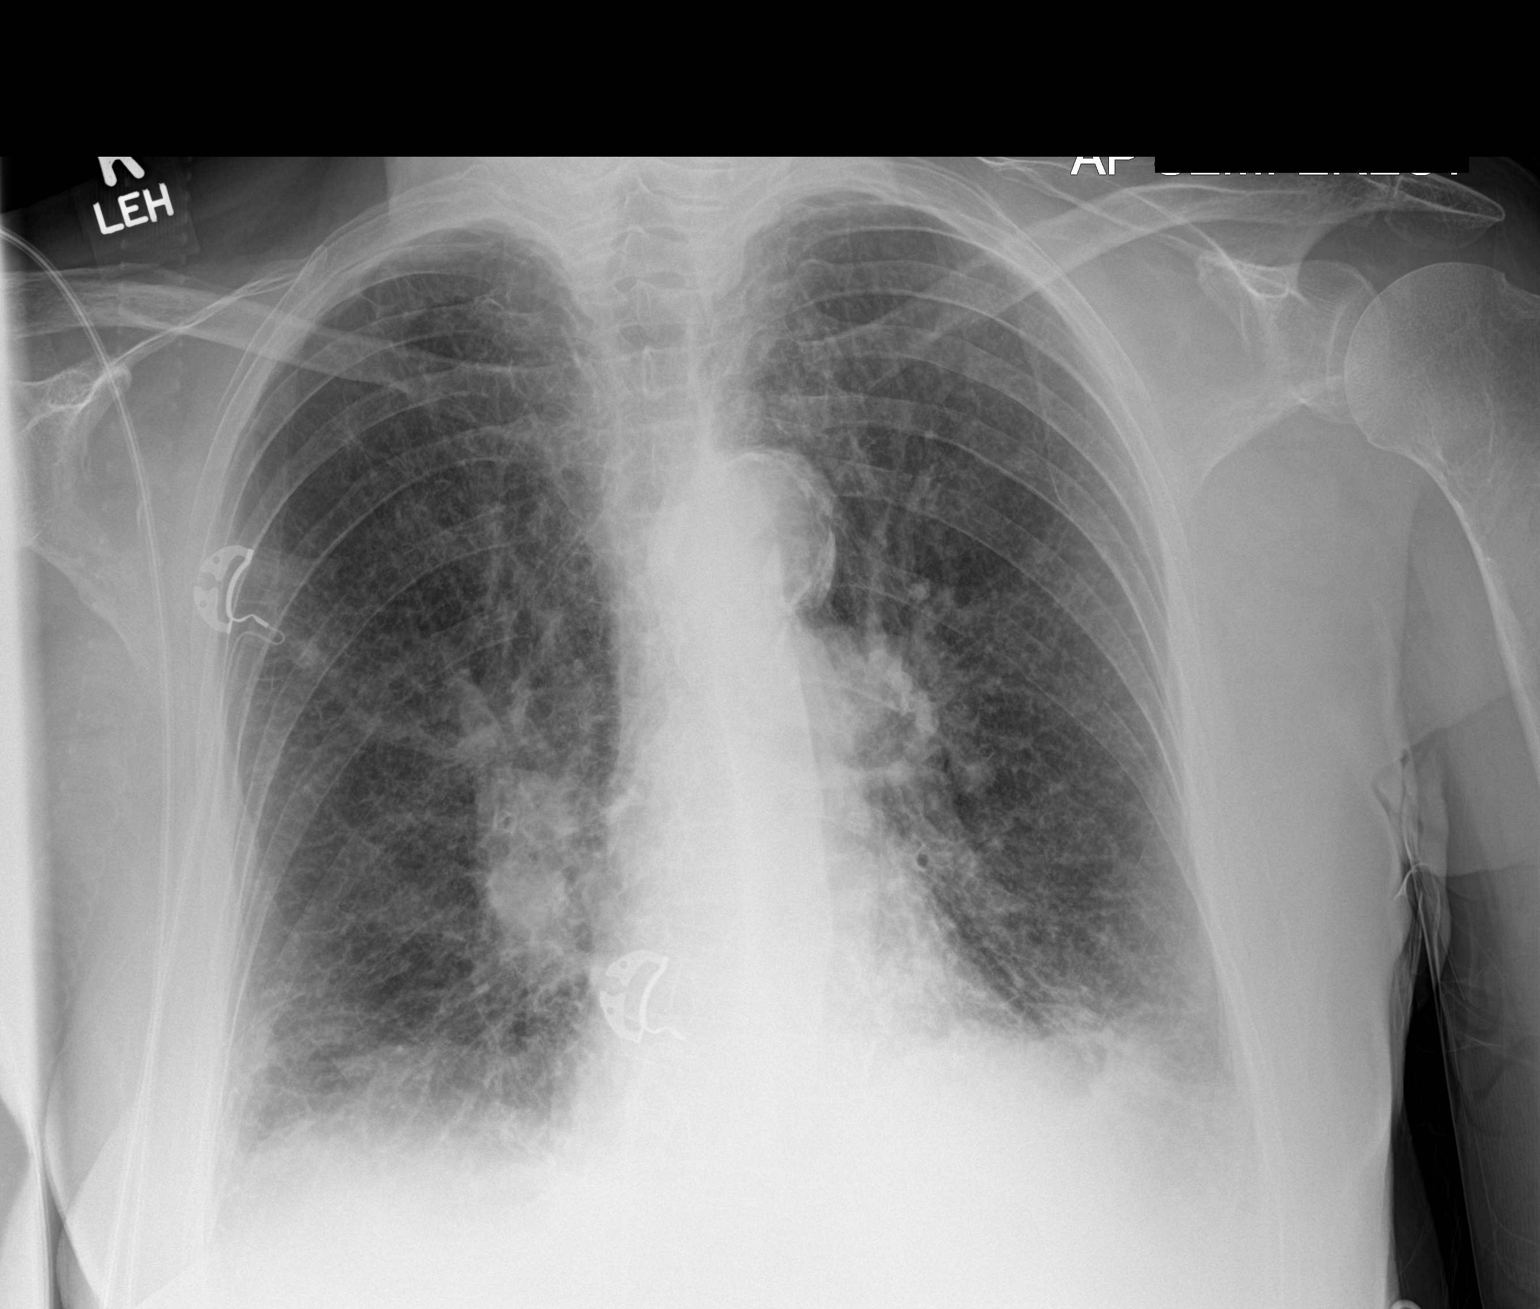

[1 of 1 positions shown; findings below may reference images not displayed]

FINDINGS: The cardiomediastinal silhouette is unchanged with fullness of the
hilar regions.

Mild bibasilar atelectasis/airspace disease has slightly increased.

Probable trace pleural effusions again noted.

Diffuse interstitial prominence again identified. Emphysema/COPD
again noted.

There is no evidence of pneumothorax or acute bony abnormality.
IMPRESSION: Slightly increasing mild bibasilar atelectasis/airspace disease. No
other significant change.

## 2018-09-22 ENCOUNTER — Ambulatory Visit (INDEPENDENT_AMBULATORY_CARE_PROVIDER_SITE_OTHER): Payer: Medicare HMO | Admitting: Nurse Practitioner

## 2018-09-22 ENCOUNTER — Ambulatory Visit (INDEPENDENT_AMBULATORY_CARE_PROVIDER_SITE_OTHER): Payer: Medicare HMO

## 2018-09-22 ENCOUNTER — Encounter (INDEPENDENT_AMBULATORY_CARE_PROVIDER_SITE_OTHER): Payer: Self-pay | Admitting: Nurse Practitioner

## 2018-09-22 VITALS — BP 95/65 | HR 101 | Resp 17 | Ht 63.0 in | Wt 127.0 lb

## 2018-09-22 DIAGNOSIS — F17210 Nicotine dependence, cigarettes, uncomplicated: Secondary | ICD-10-CM

## 2018-09-22 DIAGNOSIS — E782 Mixed hyperlipidemia: Secondary | ICD-10-CM

## 2018-09-22 DIAGNOSIS — I739 Peripheral vascular disease, unspecified: Secondary | ICD-10-CM | POA: Diagnosis not present

## 2018-09-22 DIAGNOSIS — I6523 Occlusion and stenosis of bilateral carotid arteries: Secondary | ICD-10-CM | POA: Diagnosis not present

## 2018-09-22 DIAGNOSIS — Z7982 Long term (current) use of aspirin: Secondary | ICD-10-CM

## 2018-09-22 DIAGNOSIS — J449 Chronic obstructive pulmonary disease, unspecified: Secondary | ICD-10-CM | POA: Diagnosis not present

## 2018-09-22 DIAGNOSIS — I10 Essential (primary) hypertension: Secondary | ICD-10-CM | POA: Diagnosis not present

## 2018-09-22 NOTE — Progress Notes (Signed)
Subjective:    Patient ID: Hailey Gibbs, female    DOB: 06-Nov-1946, 72 y.o.   MRN: [id number] Chief Complaint  Patient presents with  . Follow-up    40yr carotid follow up    HPI  Hailey Gibbs is a 72 y.o. female The patient is seen for follow up evaluation of carotid stenosis. The carotid stenosis followed by ultrasound.   The patient denies amaurosis fugax. There is a history of TIA symptoms or focal motor deficits, shortly before left carotid endarterectomy. There is no prior documented CVA.  The patient is taking enteric-coated aspirin 81 mg daily.  The patient states that she just stopped smoking.  There is no history of migraine headaches. There is no history of seizures.  The patient has a history of coronary artery disease, no recent episodes of angina or shortness of breath. The patient denies PAD or claudication symptoms. There is a history of hyperlipidemia which is being treated with a statin.    Carotid Duplex done today shows elevated velocities of the internal carotid artery to 277, compared with studies done on 03/06/2017 which showed the same velocities at 103.  Increased ratio from 1.1-2.68.  Past Medical History:  Diagnosis Date  . Abnormal chest sounds 08/29/2015  . Acute respiratory failure (HCC)   . Anxiety   . BP (high blood pressure) 08/29/2015  . Carotid artery occlusion   . Carotid stenosis 06/23/2015  . COPD (chronic obstructive pulmonary disease) (HCC)   . COPD exacerbation (HCC) 10/26/2016  . Depression   . GERD (gastroesophageal reflux disease)   . Hyperlipidemia   . Hypertension   . Obstructive chronic bronchitis with exacerbation (HCC) 08/29/2015  . Palliative care by specialist   . Seasonal allergies   . Shortness of breath dyspnea     Social History   Socioeconomic History  . Marital status: Divorced    Spouse name: Not on file  . Number of children: 2  . Years of education: Not on file  . Highest education level: 12th grade    Occupational History  . Occupation: retired  Engineer, production  . Financial resource strain: Not hard at all  . Food insecurity:    Worry: Never true    Inability: Never true  . Transportation needs:    Medical: No    Non-medical: No  Tobacco Use  . Smoking status: Current Every Day Smoker    Packs/day: 0.50    Years: 45.00    Pack years: 22.50    Types: Cigarettes  . Smokeless tobacco: Never Used  . Tobacco comment: give our take 0-1 pack a day / uses patch PRN  Substance and Sexual Activity  . Alcohol use: No    Alcohol/week: 0.0 standard drinks  . Drug use: No  . Sexual activity: Not on file  Lifestyle  . Physical activity:    Days per week: Not on file    Minutes per session: Not on file  . Stress: Only a little  Relationships  . Social connections:    Talks on phone: Not on file    Gets together: Not on file    Attends religious service: Not on file    Active member of club or organization: Not on file    Attends meetings of clubs or organizations: Not on file    Relationship status: Not on file  . Intimate partner violence:    Fear of current or ex partner: Not on file    Emotionally abused:  Not on file    Physically abused: Not on file    Forced sexual activity: Not on file  Other Topics Concern  . Not on file  Social History Narrative  . Not on file    Past Surgical History:  Procedure Laterality Date  . ENDARTERECTOMY Left 06/23/2015   Procedure: ENDARTERECTOMY CAROTID;  Surgeon: Annice Needy, MD;  Location: [facility] ORS;  Service: Vascular;  Laterality: Left;  . TUBAL LIGATION    . VAGINAL HYSTERECTOMY      Family History  Problem Relation Age of Onset  . Heart disease Father 18       CABG   . Hyperlipidemia Father   . Pneumonia Father   . Dementia Mother   . COPD Mother   . Diabetes Sister   . Parkinson's disease Brother   . Post-traumatic stress disorder Son     Allergies  Allergen Reactions  . Codeine Anxiety          Review of Systems    Review of Systems: Negative Unless Checked Constitutional: [] Weight loss  [] Fever  [] Chills Cardiac: [] Chest pain   []  Atrial Fibrillation  [] Palpitations   [] Shortness of breath when laying flat   [] Shortness of breath with exertion. Vascular:  [] Pain in legs with walking   [] Pain in legs with standing  [] History of DVT   [] Phlebitis   [] Swelling in legs   [] Varicose veins   [] Non-healing ulcers Pulmonary:   [x] Uses home oxygen   [] Productive cough   [] Hemoptysis   [] Wheeze  [x] COPD   [] Asthma Neurologic:  [] Dizziness   [] Seizures   [] History of stroke   [] History of TIA  [] Aphasia   [x] Vissual changes   [] Weakness or numbness in arm   [] Weakness or numbness in leg Musculoskeletal:   [] Joint swelling   [] Joint pain   [] Low back pain  []  History of Knee Replacement Hematologic:  [] Easy bruising  [] Easy bleeding   [] Hypercoagulable state   [] Anemic Gastrointestinal:  [] Diarrhea   [] Vomiting  [] Gastroesophageal reflux/heartburn   [] Difficulty swallowing. Genitourinary:  [] Chronic kidney disease   [] Difficult urination  [] Anuric   [] Blood in urine Skin:  [] Rashes   [] Ulcers  Psychological:  [] History of anxiety   []  History of major depression  []  Memory Difficulties     Objective:   Physical Exam  BP 95/65 (BP Location: Right Arm)   Pulse (!) 101   Resp 17   Ht 5\' 3"  (1.6 m)   Wt 127 lb (57.6 kg)   BMI 22.50 kg/m   Gen: WD/WN, NAD Head: [place]/AT, No temporalis wasting.  Ear/Nose/Throat: Hearing grossly intact, nares w/o erythema or drainage Eyes: PER, EOMI, sclera nonicteric.  Neck: Supple, no masses.  No JVD.  Pulmonary:  Good air movement, no use of accessory muscles.  Cardiac: RRR Vascular:  Vessel Right Left  Radial Palpable Palpable   Gastrointestinal: soft, non-distended. No guarding/no peritoneal signs.  Musculoskeletal: M/S 5/5 throughout.  No deformity or atrophy.  Neurologic: Pain and light touch intact in extremities.  Symmetrical.  Speech is fluent. Motor exam as  listed above. Psychiatric: Judgment intact, Mood & affect appropriate for pt's clinical situation. Dermatologic: No Venous rashes. No Ulcers Noted.  No changes consistent with cellulitis. Lymph : No Cervical lymphadenopathy, no lichenification or skin changes of chronic lymphedema.      Assessment & Plan:   1. Bilateral carotid artery stenosis Carotid Duplex done today shows elevated velocities of the internal carotid artery to 277, compared with studies done  on 03/06/2017 which showed the same velocities at 103.  Increased ratio from 1.1-2.68.  Recommend:  Given the patient's asymptomatic subcritical stenosis no further invasive testing or surgery at this time.  Duplex ultrasound shows  60-79% stenosis in the left ICA and 1-39% stenosis on the right   Continue antiplatelet therapy as prescribed Continue management of CAD, HTN and Hyperlipidemia Healthy heart diet,  encouraged exercise at least 4 times per week Follow up in 6 months with duplex ultrasound and physical exam   2. Mixed hyperlipidemia Continue statin as ordered and reviewed, no changes at this time   3. Essential hypertension Continue antihypertensive medications as already ordered, these medications have been reviewed and there are no changes at this time.    Current Outpatient Medications on File Prior to Visit  Medication Sig Dispense Refill  . acetaminophen (TYLENOL) 500 MG tablet Take 1,000 mg by mouth every 6 (six) hours as needed for mild pain or headache.     . albuterol (VENTOLIN HFA) 108 (90 Base) MCG/ACT inhaler INHALE 1-2 PUFFS 4 TIMES A DAY AS NEEDED FOR WHEEZING 18 Inhaler 2  . alendronate (FOSAMAX) 70 MG tablet Take 1 tablet (70 mg total) by mouth every 7 (seven) days. Take with a full glass of water on an empty stomach. 12 tablet 4  . amLODipine (NORVASC) 10 MG tablet TAKE 1 TABLET BY MOUTH EVERY DAY 90 tablet 3  . aspirin 81 MG chewable tablet Chew 81 mg by mouth at bedtime.     Marland Kitchen atorvastatin  (LIPITOR) 10 MG tablet TAKE 1 TABLET BY MOUTH EVERY DAY 90 tablet 3  . B Complex-C-Folic Acid (HM SUPER VITAMIN B COMPLEX/C PO) Take 1 tablet by mouth daily.     . calcium carbonate (OSCAL) 1500 (600 Ca) MG TABS tablet Take 600 mg of elemental calcium by mouth daily with breakfast.    . cetirizine (ZYRTEC) 10 MG tablet Take 10 mg by mouth at bedtime.     . Cholecalciferol (VITAMIN D3) 2000 units TABS Take 2,000 Units by mouth daily.     . ferrous sulfate 325 (65 FE) MG tablet Take 325 mg by mouth daily with breakfast.    . HYDROcodone-acetaminophen (NORCO/VICODIN) 5-325 MG tablet hydrocodone 5 mg-acetaminophen 325 mg tablet    . Multiple Vitamin (MULTIVITAMIN) tablet Take 1 tablet by mouth at bedtime.     . Probiotic Product (PROBIOTIC DAILY PO) Take by mouth.     . sertraline (ZOLOFT) 100 MG tablet 1 (ONE) TABLET, ORAL, AT BEDTIME DAILY BY MOUTH 90 tablet 3  . SPIRIVA HANDIHALER 18 MCG inhalation capsule INHALE 1 CAPSULE ONCE A DAY 30 capsule 11  . SYMBICORT 160-4.5 MCG/ACT inhaler INHALE 2 PUFFS INTO THE LUNGS 2 (TWO) TIMES DAILY. 10.2 Inhaler 3  . vitamin E 400 UNIT capsule Take 400 Units by mouth daily.    . potassium chloride SA (K-DUR,KLOR-CON) 20 MEQ tablet Take 1 tablet (20 mEq total) by mouth daily. (Patient not taking: Reported on 08/08/2018) 30 tablet 3   No current facility-administered medications on file prior to visit.     Patient Instructions  Carotid Artery Disease The carotid arteries are the two main arteries on either side of the neck that supply blood to the brain. Carotid artery disease, also called carotid artery stenosis, is the narrowing or blockage of one or both carotid arteries. Carotid artery disease increases your risk for a stroke or a transient ischemic attack (TIA). A TIA is an episode in which a waxy, fatty  substance that accumulates within the artery (plaque) blocks blood flow to the brain. A TIA is considered a "warning stroke." What are the causes?  Buildup  of plaque inside the carotid arteries (atherosclerosis) (common).  A weakened outpouching in an artery (aneurysm).  Inflammation of the carotid artery (arteritis).  A fibrous growth within the carotid artery (fibromuscular dysplasia).  Tissue death within the carotid artery due to radiation treatment (post-radiation necrosis).  Decreased blood flow due to spasms of the carotid artery (vasospasm).  Separation of the walls of the carotid artery (carotid dissection). What increases the risk?  High cholesterol (dyslipidemia).  High blood pressure (hypertension).  Smoking.  Obesity.  Diabetes.  Family history of cardiovascular disease.  Inactivity or lack of regular exercise.  Being female. Men have an increased risk of developing atherosclerosis earlier in life than women. What are the signs or symptoms? Carotid artery disease does not cause symptoms. How is this diagnosed? Diagnosis of carotid artery disease may include:  A physical exam. Your health care provider may hear an abnormal sound (bruit) when listening to the carotid arteries.  Specific tests that look at the blood flow in the carotid arteries. These tests include: ? Carotid artery ultrasonography. ? Carotid or cerebral angiography. ? Computerized tomographic angiography (CTA). ? Magnetic resonance angiography (MRA).  How is this treated? Treatment of carotid artery disease can include a combination of treatments. Treatment options include:  Surgery. You may have: ? A carotid endarterectomy. This is a surgery to remove the blockages in the carotid arteries. ? A carotid angioplasty with stenting. This is a nonsurgical interventional procedure. A wire mesh (stent) is used to widen the blocked carotid arteries.  Medicines to control blood pressure, cholesterol, and reduce blood clotting (antiplatelet therapy).  Adjusting your diet.  Lifestyle changes such as: ? Quitting smoking. ? Exercising as tolerated or  as directed by your health care provider. ? Controlling and maintaining a good blood pressure. ? Keeping cholesterol levels under control.  Follow these instructions at home:  Take medicines only as directed by your health care provider. Make sure you understand all your medicine instructions. Do not stop your medicines without talking to your health care provider.  Follow your health care provider's diet instructions. It is important to eat a healthy diet that is low in saturated fats and includes plenty of fresh fruits, vegetables, and lean meats. High-fat, high-sodium foods as well as foods that are fried, overly processed, or have poor nutritional value should be avoided.  Maintain a healthy weight.  Stay physically active. It is recommended that you get at least 30 minutes of activity every day.  Do not use any tobacco products including cigarettes, chewing tobacco, or electronic cigarettes. If you need help quitting, ask your health care provider.  Limit alcohol use to: ? No more than 2 drinks per day for men. ? No more than 1 drink per day for nonpregnant women.  Do not use illegal drugs.  Keep all follow-up visits as directed by your health care provider. Get help right away if: You develop TIA or stroke symptoms. These include:  Sudden weakness or numbness on one side of the body, such as in the face, arm, or leg.  Sudden confusion.  Trouble speaking (aphasia) or understanding.  Sudden trouble seeing out of one or both eyes.  Sudden trouble walking.  Dizziness or feeling like you might faint.  Loss of balance or coordination.  Sudden severe headache with no known cause.  Sudden trouble swallowing (  dysphagia).  If you have any of these symptoms, call your local emergency services (911 in [place] Do not drive yourself to the clinic or hospital. This is a medical emergency. This information is not intended to replace advice given to you by your health care provider.  Make sure you discuss any questions you have with your health care provider. Document Released: 02/18/2012 Document Revised: 05/03/2016 Document Reviewed: 05/27/2013 Elsevier Interactive Patient Education  2017 [employer].   No follow-ups on file.   Georgiana Spinner, NP

## 2018-09-22 NOTE — Patient Instructions (Signed)
Carotid Artery Disease The carotid arteries are the two main arteries on either side of the neck that supply blood to the brain. Carotid artery disease, also called carotid artery stenosis, is the narrowing or blockage of one or both carotid arteries. Carotid artery disease increases your risk for a stroke or a transient ischemic attack (TIA). A TIA is an episode in which a waxy, fatty substance that accumulates within the artery (plaque) blocks blood flow to the brain. A TIA is considered a "warning stroke." What are the causes?  Buildup of plaque inside the carotid arteries (atherosclerosis) (common).  A weakened outpouching in an artery (aneurysm).  Inflammation of the carotid artery (arteritis).  A fibrous growth within the carotid artery (fibromuscular dysplasia).  Tissue death within the carotid artery due to radiation treatment (post-radiation necrosis).  Decreased blood flow due to spasms of the carotid artery (vasospasm).  Separation of the walls of the carotid artery (carotid dissection). What increases the risk?  High cholesterol (dyslipidemia).  High blood pressure (hypertension).  Smoking.  Obesity.  Diabetes.  Family history of cardiovascular disease.  Inactivity or lack of regular exercise.  Being female. Men have an increased risk of developing atherosclerosis earlier in life than women. What are the signs or symptoms? Carotid artery disease does not cause symptoms. How is this diagnosed? Diagnosis of carotid artery disease may include:  A physical exam. Your health care provider may hear an abnormal sound (bruit) when listening to the carotid arteries.  Specific tests that look at the blood flow in the carotid arteries. These tests include: ? Carotid artery ultrasonography. ? Carotid or cerebral angiography. ? Computerized tomographic angiography (CTA). ? Magnetic resonance angiography (MRA).  How is this treated? Treatment of carotid artery disease can  include a combination of treatments. Treatment options include:  Surgery. You may have: ? A carotid endarterectomy. This is a surgery to remove the blockages in the carotid arteries. ? A carotid angioplasty with stenting. This is a nonsurgical interventional procedure. A wire mesh (stent) is used to widen the blocked carotid arteries.  Medicines to control blood pressure, cholesterol, and reduce blood clotting (antiplatelet therapy).  Adjusting your diet.  Lifestyle changes such as: ? Quitting smoking. ? Exercising as tolerated or as directed by your health care provider. ? Controlling and maintaining a good blood pressure. ? Keeping cholesterol levels under control.  Follow these instructions at home:  Take medicines only as directed by your health care provider. Make sure you understand all your medicine instructions. Do not stop your medicines without talking to your health care provider.  Follow your health care provider's diet instructions. It is important to eat a healthy diet that is low in saturated fats and includes plenty of fresh fruits, vegetables, and lean meats. High-fat, high-sodium foods as well as foods that are fried, overly processed, or have poor nutritional value should be avoided.  Maintain a healthy weight.  Stay physically active. It is recommended that you get at least 30 minutes of activity every day.  Do not use any tobacco products including cigarettes, chewing tobacco, or electronic cigarettes. If you need help quitting, ask your health care provider.  Limit alcohol use to: ? No more than 2 drinks per day for men. ? No more than 1 drink per day for nonpregnant women.  Do not use illegal drugs.  Keep all follow-up visits as directed by your health care provider. Get help right away if: You develop TIA or stroke symptoms. These include:  Sudden   weakness or numbness on one side of the body, such as in the face, arm, or leg.  Sudden  confusion.  Trouble speaking (aphasia) or understanding.  Sudden trouble seeing out of one or both eyes.  Sudden trouble walking.  Dizziness or feeling like you might faint.  Loss of balance or coordination.  Sudden severe headache with no known cause.  Sudden trouble swallowing (dysphagia).  If you have any of these symptoms, call your local emergency services (911 in [place] Do not drive yourself to the clinic or hospital. This is a medical emergency. This information is not intended to replace advice given to you by your health care provider. Make sure you discuss any questions you have with your health care provider. Document Released: 02/18/2012 Document Revised: 05/03/2016 Document Reviewed: 05/27/2013 Elsevier Interactive Patient Education  2017 [employer]  

## 2018-09-24 DIAGNOSIS — H2513 Age-related nuclear cataract, bilateral: Secondary | ICD-10-CM | POA: Diagnosis not present

## 2018-09-24 DIAGNOSIS — H02055 Trichiasis without entropian left lower eyelid: Secondary | ICD-10-CM | POA: Diagnosis not present

## 2018-09-24 DIAGNOSIS — H40039 Anatomical narrow angle, unspecified eye: Secondary | ICD-10-CM | POA: Diagnosis not present

## 2018-09-30 DIAGNOSIS — J449 Chronic obstructive pulmonary disease, unspecified: Secondary | ICD-10-CM | POA: Diagnosis not present

## 2018-10-12 IMAGING — DX DG CHEST 1V PORT
1 series · 1 of 1 positions shown · non-contrast
Comparison: Chest radiograph 01/19/2018.

CLINICAL DATA: Patient with generalized abdominal pain.

EXAM:
PORTABLE CHEST 1 VIEW

[chest ap]
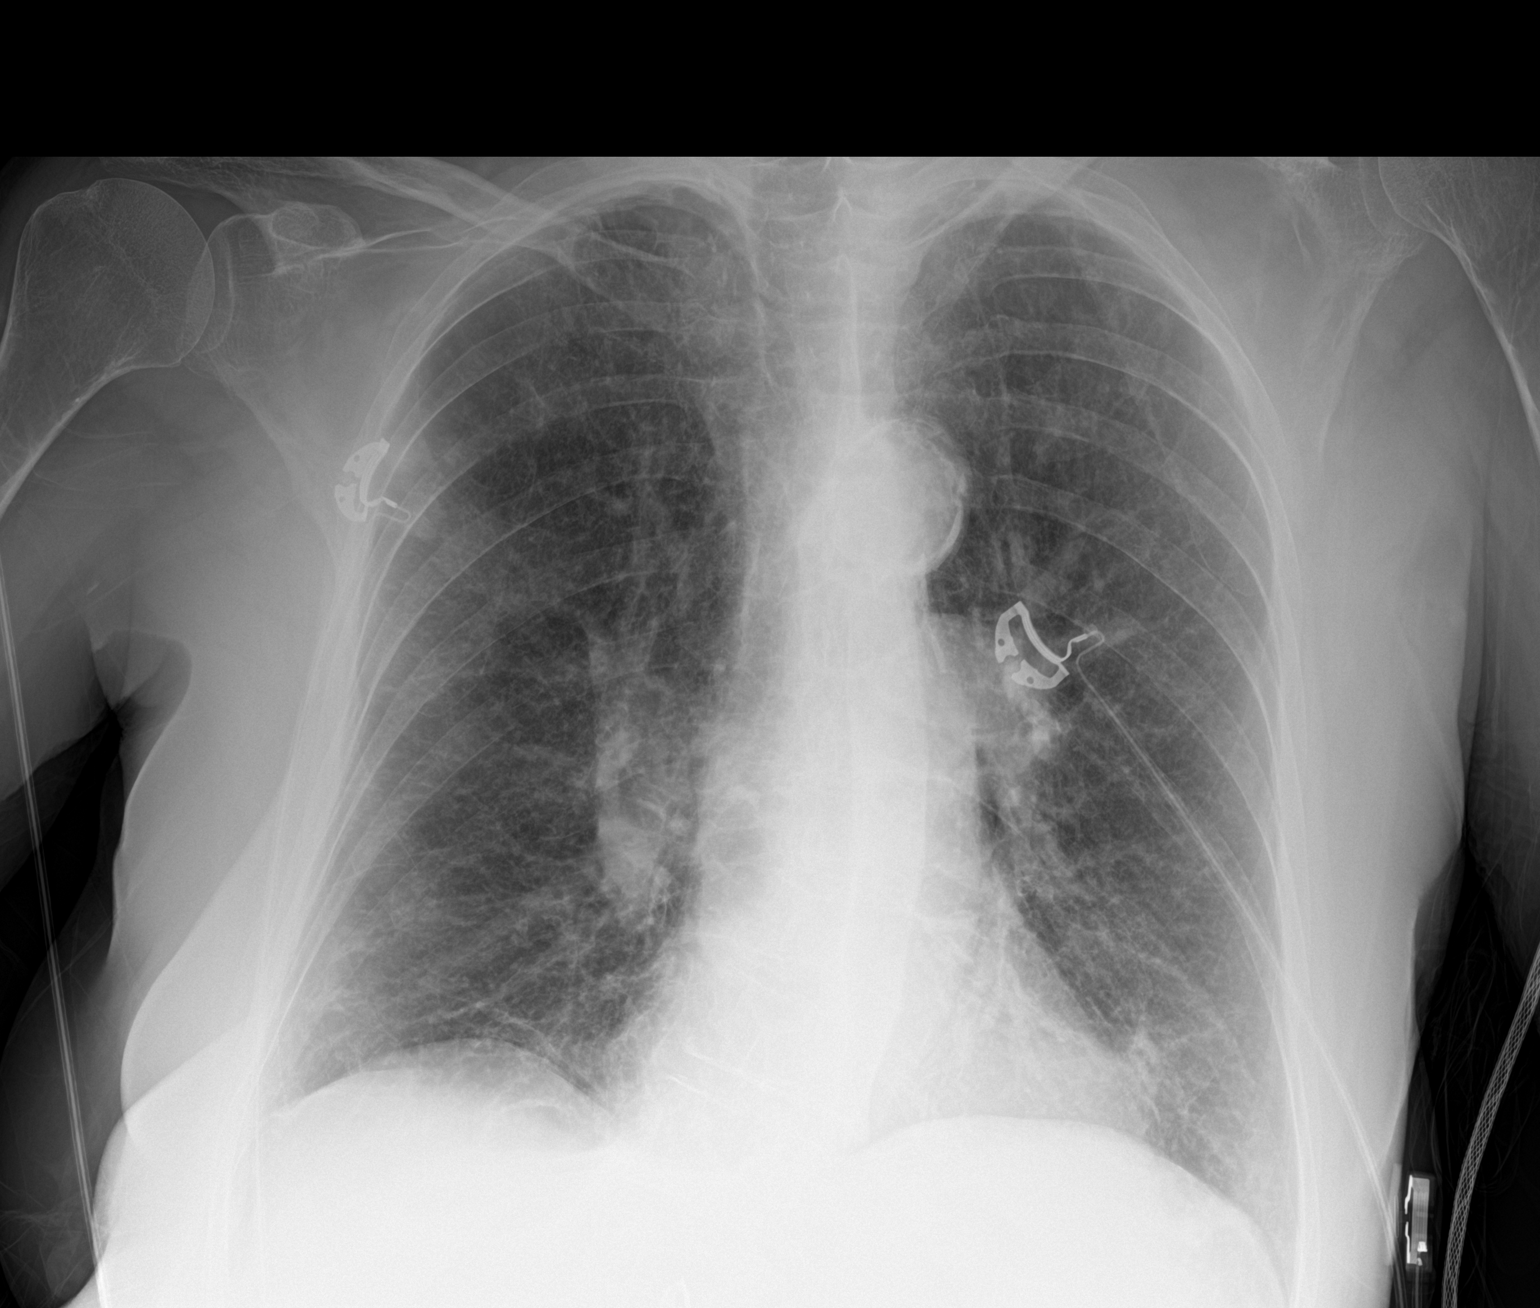

[1 of 1 positions shown; findings below may reference images not displayed]

FINDINGS: Monitoring leads overlie the patient. Normal cardiac and mediastinal
contours. Aortic atherosclerosis. Basilar atelectasis/scarring. No
consolidative pulmonary opacities. No pleural effusion or
pneumothorax.
IMPRESSION: Basilar atelectasis/scarring.

## 2018-10-22 DIAGNOSIS — H2512 Age-related nuclear cataract, left eye: Secondary | ICD-10-CM | POA: Diagnosis not present

## 2018-10-23 DIAGNOSIS — J449 Chronic obstructive pulmonary disease, unspecified: Secondary | ICD-10-CM | POA: Diagnosis not present

## 2018-10-29 ENCOUNTER — Encounter: Payer: Self-pay | Admitting: *Deleted

## 2018-10-31 DIAGNOSIS — J449 Chronic obstructive pulmonary disease, unspecified: Secondary | ICD-10-CM | POA: Diagnosis not present

## 2018-11-04 ENCOUNTER — Ambulatory Visit
Admission: RE | Admit: 2018-11-04 | Discharge: 2018-11-04 | Disposition: A | Discharge status: Home / Self Care | Payer: Medicare HMO | Source: Ambulatory Visit | Attending: Ophthalmology | Admitting: Ophthalmology

## 2018-11-04 ENCOUNTER — Ambulatory Visit: Payer: Medicare HMO | Admitting: Anesthesiology

## 2018-11-04 ENCOUNTER — Other Ambulatory Visit: Payer: Self-pay

## 2018-11-04 ENCOUNTER — Encounter
Admission: RE | Disposition: A | Discharge status: Home / Self Care | Payer: Self-pay | Source: Ambulatory Visit | Attending: Ophthalmology

## 2018-11-04 ENCOUNTER — Encounter: Payer: Self-pay | Admitting: *Deleted

## 2018-11-04 DIAGNOSIS — Z7951 Long term (current) use of inhaled steroids: Secondary | ICD-10-CM | POA: Insufficient documentation

## 2018-11-04 DIAGNOSIS — F17200 Nicotine dependence, unspecified, uncomplicated: Secondary | ICD-10-CM | POA: Diagnosis not present

## 2018-11-04 DIAGNOSIS — I2510 Atherosclerotic heart disease of native coronary artery without angina pectoris: Secondary | ICD-10-CM | POA: Diagnosis not present

## 2018-11-04 DIAGNOSIS — I10 Essential (primary) hypertension: Secondary | ICD-10-CM | POA: Insufficient documentation

## 2018-11-04 DIAGNOSIS — J441 Chronic obstructive pulmonary disease with (acute) exacerbation: Secondary | ICD-10-CM | POA: Diagnosis not present

## 2018-11-04 DIAGNOSIS — Z7982 Long term (current) use of aspirin: Secondary | ICD-10-CM | POA: Diagnosis not present

## 2018-11-04 DIAGNOSIS — H2512 Age-related nuclear cataract, left eye: Secondary | ICD-10-CM | POA: Diagnosis not present

## 2018-11-04 DIAGNOSIS — Z79899 Other long term (current) drug therapy: Secondary | ICD-10-CM | POA: Insufficient documentation

## 2018-11-04 DIAGNOSIS — Z9981 Dependence on supplemental oxygen: Secondary | ICD-10-CM | POA: Insufficient documentation

## 2018-11-04 DIAGNOSIS — J449 Chronic obstructive pulmonary disease, unspecified: Secondary | ICD-10-CM | POA: Diagnosis not present

## 2018-11-04 DIAGNOSIS — E785 Hyperlipidemia, unspecified: Secondary | ICD-10-CM | POA: Insufficient documentation

## 2018-11-04 DIAGNOSIS — F419 Anxiety disorder, unspecified: Secondary | ICD-10-CM | POA: Diagnosis not present

## 2018-11-04 DIAGNOSIS — Z885 Allergy status to narcotic agent status: Secondary | ICD-10-CM | POA: Diagnosis not present

## 2018-11-04 DIAGNOSIS — F418 Other specified anxiety disorders: Secondary | ICD-10-CM | POA: Diagnosis not present

## 2018-11-04 DIAGNOSIS — K219 Gastro-esophageal reflux disease without esophagitis: Secondary | ICD-10-CM | POA: Diagnosis not present

## 2018-11-04 HISTORY — DX: Palpitations: R00.2

## 2018-11-04 HISTORY — PX: CATARACT EXTRACTION W/PHACO: SHX586

## 2018-11-04 HISTORY — DX: Personal history of other specified conditions: Z87.898

## 2018-11-04 HISTORY — DX: Atherosclerotic heart disease of native coronary artery without angina pectoris: I25.10

## 2018-11-04 HISTORY — DX: Unspecified hearing loss, unspecified ear: H91.90

## 2018-11-04 HISTORY — DX: Dependence on supplemental oxygen: Z99.81

## 2018-11-04 SURGERY — PHACOEMULSIFICATION, CATARACT, WITH IOL INSERTION
Anesthesia: Monitor Anesthesia Care | Site: Eye | Laterality: Left

## 2018-11-04 MED ORDER — MIDAZOLAM HCL 2 MG/2ML IJ SOLN
INTRAMUSCULAR | Status: DC | PRN
  Administered 2018-11-04: 2 mg via INTRAVENOUS

## 2018-11-04 MED ORDER — SODIUM CHLORIDE 0.9 % IV SOLN
INTRAVENOUS | Status: DC
  Administered 2018-11-04: 09:00:00 via INTRAVENOUS

## 2018-11-04 MED ORDER — ARMC OPHTHALMIC DILATING DROPS
1.0000 "application " | OPHTHALMIC | Status: AC
  Administered 2018-11-04 (×3): 1 via OPHTHALMIC

## 2018-11-04 MED ORDER — LIDOCAINE HCL (PF) 4 % IJ SOLN
INTRAOCULAR | Status: DC | PRN
  Administered 2018-11-04: 4 mL via OPHTHALMIC

## 2018-11-04 MED ORDER — MIDAZOLAM HCL 2 MG/2ML IJ SOLN
INTRAMUSCULAR | Status: AC
  Filled 2018-11-04: qty 2

## 2018-11-04 MED ORDER — LIDOCAINE HCL (PF) 4 % IJ SOLN
INTRAMUSCULAR | Status: AC
  Filled 2018-11-04: qty 5

## 2018-11-04 MED ORDER — NA CHONDROIT SULF-NA HYALURON 40-17 MG/ML IO SOLN
INTRAOCULAR | Status: AC
  Filled 2018-11-04: qty 1

## 2018-11-04 MED ORDER — ARMC OPHTHALMIC DILATING DROPS
OPHTHALMIC | Status: AC
  Filled 2018-11-04: qty 0.5

## 2018-11-04 MED ORDER — MOXIFLOXACIN HCL 0.5 % OP SOLN
OPHTHALMIC | Status: AC
  Filled 2018-11-04: qty 3

## 2018-11-04 MED ORDER — EPINEPHRINE PF 1 MG/ML IJ SOLN
INTRAMUSCULAR | Status: AC
  Filled 2018-11-04: qty 2

## 2018-11-04 MED ORDER — CARBACHOL 0.01 % IO SOLN
INTRAOCULAR | Status: DC | PRN
  Administered 2018-11-04: 0.5 mL via INTRAOCULAR

## 2018-11-04 MED ORDER — MOXIFLOXACIN HCL 0.5 % OP SOLN: 1.0000 [drp] | OPHTHALMIC | Status: DC | PRN

## 2018-11-04 MED ORDER — POVIDONE-IODINE 5 % OP SOLN
OPHTHALMIC | Status: DC | PRN
  Administered 2018-11-04: 1 via OPHTHALMIC

## 2018-11-04 MED ORDER — TETRACAINE HCL 0.5 % OP SOLN
OPHTHALMIC | Status: AC
  Administered 2018-11-04: 1 [drp] via OPHTHALMIC
  Filled 2018-11-04: qty 4

## 2018-11-04 MED ORDER — MOXIFLOXACIN HCL 0.5 % OP SOLN
OPHTHALMIC | Status: DC | PRN
  Administered 2018-11-04: 0.2 mL via OPHTHALMIC

## 2018-11-04 MED ORDER — TETRACAINE HCL 0.5 % OP SOLN
1.0000 [drp] | OPHTHALMIC | Status: AC | PRN
  Administered 2018-11-04 (×3): 1 [drp] via OPHTHALMIC

## 2018-11-04 MED ORDER — POVIDONE-IODINE 5 % OP SOLN
OPHTHALMIC | Status: AC
  Filled 2018-11-04: qty 30

## 2018-11-04 MED ORDER — NA CHONDROIT SULF-NA HYALURON 40-17 MG/ML IO SOLN
INTRAOCULAR | Status: DC | PRN
  Administered 2018-11-04: 1 mL via INTRAOCULAR

## 2018-11-04 MED ORDER — EPINEPHRINE PF 1 MG/ML IJ SOLN
INTRAOCULAR | Status: DC | PRN
  Administered 2018-11-04: 10:00:00 via OPHTHALMIC

## 2018-11-04 SURGICAL SUPPLY — 16 items
GLOVE BIO SURGEON STRL SZ8 (GLOVE) ×3 IMPLANT
GLOVE BIOGEL M 6.5 STRL (GLOVE) ×3 IMPLANT
GLOVE SURG LX 8.0 MICRO (GLOVE) ×2
GLOVE SURG LX STRL 8.0 MICRO (GLOVE) ×1 IMPLANT
GOWN STRL REUS W/ TWL LRG LVL3 (GOWN DISPOSABLE) ×2 IMPLANT
GOWN STRL REUS W/TWL LRG LVL3 (GOWN DISPOSABLE) ×4
LABEL CATARACT MEDS ST (LABEL) ×3 IMPLANT
LENS IOL TECNIS ITEC 18.0 (Intraocular Lens) ×3 IMPLANT
PACK CATARACT (MISCELLANEOUS) ×3 IMPLANT
PACK CATARACT BRASINGTON LX (MISCELLANEOUS) ×3 IMPLANT
PACK EYE AFTER SURG (MISCELLANEOUS) ×3 IMPLANT
SOL BSS BAG (MISCELLANEOUS) ×3
SOLUTION BSS BAG (MISCELLANEOUS) ×1 IMPLANT
SYR 5ML LL (SYRINGE) ×3 IMPLANT
WATER STERILE IRR 250ML POUR (IV SOLUTION) ×3 IMPLANT
WIPE NON LINTING 3.25X3.25 (MISCELLANEOUS) ×3 IMPLANT

## 2018-11-04 NOTE — Anesthesia Post-op Follow-up Note (Signed)
Anesthesia QCDR form completed.        

## 2018-11-04 NOTE — Anesthesia Postprocedure Evaluation (Signed)
Anesthesia Post Note  Patient: Hailey Gibbs  Procedure(s) Performed: CATARACT EXTRACTION PHACO AND INTRAOCULAR LENS PLACEMENT (IOC) (Left Eye)  Patient location during evaluation: PACU Anesthesia Type: MAC Level of consciousness: awake, awake and alert and oriented Pain management: pain level controlled Vital Signs Assessment: post-procedure vital signs reviewed and stable Respiratory status: spontaneous breathing, nonlabored ventilation and respiratory function stable Cardiovascular status: blood pressure returned to baseline and stable Postop Assessment: no headache Anesthetic complications: no     Last Vitals:  Vitals:   11/04/18 0846 11/04/18 1005  BP: (!) 125/55   Pulse: (!) 110 97  Resp: 18 16  Temp: 36.7 C (!) 36.2 C  SpO2: 93% 100%    Last Pain:  Vitals:   11/04/18 1005  TempSrc: Temporal  PainSc: 0-No pain                 Kelechi Orgeron,  Wynne Rozak R

## 2018-11-04 NOTE — Op Note (Signed)
PREOPERATIVE DIAGNOSIS:  Nuclear sclerotic cataract of the left eye.   POSTOPERATIVE DIAGNOSIS:  Nuclear sclerotic cataract of the left eye.   OPERATIVE PROCEDURE: Procedure(s): CATARACT EXTRACTION PHACO AND INTRAOCULAR LENS PLACEMENT (IOC)   SURGEON:  Galen ManilaWilliam Adeleine Pask, MD.   ANESTHESIA:  Anesthesiologist: Jovita GammaFitzgerald, Kathryn L, MD CRNA: Junious SilkNoles, Mark, CRNA  1.      Managed anesthesia care. 2.     0.461ml of Shugarcaine was instilled following the paracentesis   COMPLICATIONS:  None.   TECHNIQUE:   Stop and chop   DESCRIPTION OF PROCEDURE:  The patient was examined and consented in the preoperative holding area where the aforementioned topical anesthesia was applied to the left eye and then brought back to the Operating Room where the left eye was prepped and draped in the usual sterile ophthalmic fashion and a lid speculum was placed. A paracentesis was created with the side port blade and the anterior chamber was filled with viscoelastic. A near clear corneal incision was performed with the steel keratome. A continuous curvilinear capsulorrhexis was performed with a cystotome followed by the capsulorrhexis forceps. Hydrodissection and hydrodelineation were carried out with BSS on a blunt cannula. The lens was removed in a stop and chop  technique and the remaining cortical material was removed with the irrigation-aspiration handpiece. The capsular bag was inflated with viscoelastic and the Technis [id number] lens was placed in the capsular bag without complication. The remaining viscoelastic was removed from the eye with the irrigation-aspiration handpiece. The wounds were hydrated. The anterior chamber was flushed with Miostat and the eye was inflated to physiologic pressure. 0.941ml Vigamox was placed in the anterior chamber. The wounds were found to be water tight. The eye was dressed with Vigamox. The patient was given protective glasses to wear throughout the day and a shield with which to sleep  tonight. The patient was also given drops with which to begin a drop regimen today and will follow-up with me in one day. Implant Name Type Inv. Item Serial No. Manufacturer Lot No. LRB No. Used  LENS IOL DIOP 18.0 - [id number] Intraocular Lens LENS IOL DIOP 18.0 [id number] AMO  Left 1    Procedure(s) with comments: CATARACT EXTRACTION PHACO AND INTRAOCULAR LENS PLACEMENT (IOC) (Left) - US 00:49.1 CDE 6.32 Fluid Pack Lot # [id number]  Electronically signed: Galen ManilaWilliam Laiana Fratus 11/04/2018 10:05 AM

## 2018-11-04 NOTE — Anesthesia Procedure Notes (Signed)
Procedure Name: MAC Date/Time: 11/04/2018 9:55 AM Performed by: Nelda Marseille, CRNA Pre-anesthesia Checklist: Patient identified, Emergency Drugs available, Suction available, Patient being monitored and Timeout performed Oxygen Delivery Method: Nasal cannula

## 2018-11-04 NOTE — Discharge Instructions (Signed)
Eye Surgery Discharge Instructions    Expect mild scratchy sensation or mild soreness. DO NOT RUB YOUR EYE!  The day of surgery:  Minimal physical activity, but bed rest is not required  No reading, computer work, or close hand work  No bending, lifting, or straining.  May watch TV  For 24 hours:  No driving, legal decisions, or alcoholic beverages  Safety precautions  Eat anything you prefer: It is better to start with liquids, then soup then solid foods.  _____ Eye patch should be worn until postoperative exam tomorrow.  ____ Solar shield eyeglasses should be worn for comfort in the sunlight/patch while sleeping  Resume all regular medications including aspirin or Coumadin if these were discontinued prior to surgery. You may shower, bathe, shave, or wash your hair. Tylenol may be taken for mild discomfort.  Call your doctor if you experience significant pain, nausea, or vomiting, fever > 101 or other signs of infection. [phone] or [phone] Specific instructions:  Follow-up Information    Galen ManilaPorfilio, William, MD Follow up.   Specialty:  Ophthalmology Why:  November 27 at 10:25am Contact information: [address] [place] [address] [phone]          Eye Surgery Discharge Instructions    Expect mild scratchy sensation or mild soreness. DO NOT RUB YOUR EYE!  The day of surgery:  Minimal physical activity, but bed rest is not required  No reading, computer work, or close hand work  No bending, lifting, or straining.  May watch TV  For 24 hours:  No driving, legal decisions, or alcoholic beverages  Safety precautions  Eat anything you prefer: It is better to start with liquids, then soup then solid foods.  _____ Eye patch should be worn until postoperative exam tomorrow.  ____ Solar shield eyeglasses should be worn for comfort in the sunlight/patch while sleeping  Resume all regular medications including aspirin or Coumadin  if these were discontinued prior to surgery. You may shower, bathe, shave, or wash your hair. Tylenol may be taken for mild discomfort.  Call your doctor if you experience significant pain, nausea, or vomiting, fever > 101 or other signs of infection. [phone] or [phone] Specific instructions:  Follow-up Information    Galen ManilaPorfilio, William, MD Follow up.   Specialty:  Ophthalmology Why:  November 27 at 10:25am Contact information: [address] [place] [address] [phone]         Eye Surgery Discharge Instructions    Expect mild scratchy sensation or mild soreness. DO NOT RUB YOUR EYE!  The day of surgery:  Minimal physical activity, but bed rest is not required  No reading, computer work, or close hand work  No bending, lifting, or straining.  May watch TV  For 24 hours:  No driving, legal decisions, or alcoholic beverages  Safety precautions  Eat anything you prefer: It is better to start with liquids, then soup then solid foods.  _____ Eye patch should be worn until postoperative exam tomorrow.  ____ Solar shield eyeglasses should be worn for comfort in the sunlight/patch while sleeping  Resume all regular medications including aspirin or Coumadin if these were discontinued prior to surgery. You may shower, bathe, shave, or wash your hair. Tylenol may be taken for mild discomfort.  Call your doctor if you experience significant pain, nausea, or vomiting, fever > 101 or other signs of infection. [phone] or [phone] Specific instructions:  Follow-up Information    Galen ManilaPorfilio, William, MD Follow up.   Specialty:  Ophthalmology  Why:  November 27 at 10:25am Contact information: [address] [place] [address] [phone]

## 2018-11-04 NOTE — Anesthesia Preprocedure Evaluation (Addendum)
Anesthesia Evaluation  Patient identified by MRN, date of birth, ID band Patient awake    Reviewed: Allergy & Precautions, H&P , NPO status , Patient's Chart, lab work & pertinent test results  History of Anesthesia Complications Negative for: history of anesthetic complications  Airway Mallampati: III       Dental  (+) Upper Dentures, Lower Dentures   Pulmonary shortness of breath, COPD (wears 2L [place] ATC),  oxygen dependent, Current Smoker,  No recent illnesses or COPD exacerbations.  SOB is at baseline          Cardiovascular hypertension, + CAD and + Orthopnea       Neuro/Psych PSYCHIATRIC DISORDERS Anxiety Depression negative neurological ROS     GI/Hepatic Neg liver ROS, GERD  ,  Endo/Other  negative endocrine ROS  Renal/GU      Musculoskeletal   Abdominal   Peds  Hematology negative hematology ROS (+)   Anesthesia Other Findings Past Medical History: 08/29/2015: Abnormal chest sounds No date: Acute respiratory failure (HCC) No date: Anxiety 08/29/2015: BP (high blood pressure) No date: Carotid artery occlusion 06/23/2015: Carotid stenosis No date: COPD (chronic obstructive pulmonary disease) (HCC) 10/26/2016: COPD exacerbation (HCC) No date: Coronary artery disease No date: Depression No date: GERD (gastroesophageal reflux disease) No date: History of orthopnea No date: HOH (hard of hearing) No date: Hyperlipidemia No date: Hypertension 08/29/2015: Obstructive chronic bronchitis with exacerbation (HCC) No date: Oxygen dependent     Comment:  2L/MIN CONTINUOUS No date: Palliative care by specialist No date: Palpitations No date: Seasonal allergies No date: Shortness of breath dyspnea  Past Surgical History: 06/23/2015: ENDARTERECTOMY; Left     Comment:  Procedure: ENDARTERECTOMY CAROTID;  Surgeon: Jason S               Dew, MD;  Location: [facility] ORS;  Service: Vascular;                Laterality:  Left; No date: TUBAL LIGATION No date: VAGINAL HYSTERECTOMY  BMI    Body Mass Index:  22.64 kg/m      Reproductive/Obstetrics negative OB ROS                             Anesthesia Physical  Anesthesia Plan  ASA: III  Anesthesia Plan: MAC   Post-op Pain Management:    Induction:   PONV Risk Score and Plan:   Airway Management Planned:   Additional Equipment:   Intra-op Plan:   Post-operative Plan:   Informed Consent: I have reviewed the patients History and Physical, chart, labs and discussed the procedure including the risks, benefits and alternatives for the proposed anesthesia with the patient or authorized representative who has indicated his/her understanding and acceptance.     Plan Discussed with: Anesthesiologist and CRNA  Anesthesia Plan Comments: (Specifically discussed DNR.  Pt denies ever saying she wanted to be DNR.  She is full code.)        Anesthesia Quick Evaluation  

## 2018-11-04 NOTE — Transfer of Care (Signed)
Immediate Anesthesia Transfer of Care Note  Patient: Hailey Gibbs  Procedure(s) Performed: CATARACT EXTRACTION PHACO AND INTRAOCULAR LENS PLACEMENT ([place]) (Left Eye)  Patient Location: PACU  Anesthesia Type:MAC  Level of Consciousness: awake, alert  and oriented  Airway & Oxygen Therapy: Patient Spontanous Breathing and Patient connected to nasal cannula oxygen  Post-op Assessment: Report given to RN and Post -op Vital signs reviewed and stable  Post vital signs: Reviewed and stable  Last Vitals:  Vitals Value Taken Time  BP    Temp 36.2 C 11/04/2018 10:05 AM  Pulse 97 11/04/2018 10:05 AM  Resp 16 11/04/2018 10:05 AM  SpO2 100 % 11/04/2018 10:05 AM    Last Pain:  Vitals:   11/04/18 1005  TempSrc: Temporal  PainSc: 0-No pain         Complications: No apparent anesthesia complications

## 2018-11-04 NOTE — H&P (Signed)
All labs reviewed. Abnormal studies sent to patients PCP when indicated.  Previous H&P reviewed, patient examined, there are NO CHANGES.  Hailey Winstead Porfilio11/26/20199:36 AM

## 2018-11-08 ENCOUNTER — Other Ambulatory Visit: Payer: Self-pay | Admitting: Family Medicine

## 2018-11-10 NOTE — Progress Notes (Signed)
Patient: Hailey Gibbs Female    DOB: [birth year]   72 y.o.   MRN: [id number] Visit Date: 11/11/2018  Today's Provider: Dortha Kernennis Chrismon, PA   Chief Complaint  Patient presents with  . Follow-up  . Hypertension  . Hyperlipidemia  . Anxiety   Subjective:    HPI   Hypertension, follow-up:  BP Readings from Last 3 Encounters:  11/11/18 102/60  11/04/18 (!) 125/55  09/22/18 95/65    She was last seen for hypertension 4 months ago.  BP at that visit was 104/62. Management since that visit includes; labs checked, no changes.She reports good compliance with treatment. She is not having side effects. none She is not exercising. She is adherent to low salt diet.   Outside blood pressures are normal. She is experiencing none.  Patient denies none.   Cardiovascular risk factors include advanced age (older than 7255 for men, 4765 for women).  Use of agents associated with hypertension: none.   ---------------------------------------------------------------   Lipid/Cholesterol, Follow-up:   Last seen for this 4 months ago.  Management since that visit includes; labs checked, no changes.  Last Lipid Panel:    Component Value Date/Time   CHOL 153 08/08/2018 1430   CHOL 110 02/26/2014 0412   TRIG 113 08/08/2018 1430   TRIG 199 02/26/2014 0412   HDL 71 08/08/2018 1430   HDL 13 (L) 02/26/2014 0412   CHOLHDL 2.2 08/08/2018 1430   VLDL 40 02/26/2014 0412   LDLCALC 59 08/08/2018 1430   LDLCALC 57 02/26/2014 0412    She reports good compliance with treatment. She is not having side effects. none  Wt Readings from Last 3 Encounters:  11/11/18 126 lb (57.2 kg)  11/04/18 136 lb 0.4 oz (61.7 kg)  09/22/18 127 lb (57.6 kg)   ---------------------------------------------------------------  Anxiety and depression From 08/08/2018 having more anxiety with family stress (had a couple move back into her home). She restarted smoking "so they would move out  again".  Pulmonary emphysema, unspecified emphysema type (HCC) Having more wheezing and dyspnea over the past couple weeks since she started smoking 1.5 ppd again. Occasional greenish sputum production. Past Medical History:  Diagnosis Date  . Abnormal chest sounds 08/29/2015  . Acute respiratory failure (HCC)   . Anxiety   . BP (high blood pressure) 08/29/2015  . Carotid artery occlusion   . Carotid stenosis 06/23/2015  . COPD (chronic obstructive pulmonary disease) (HCC)   . COPD exacerbation (HCC) 10/26/2016  . Coronary artery disease   . Depression   . GERD (gastroesophageal reflux disease)   . History of orthopnea   . HOH (hard of hearing)   . Hyperlipidemia   . Hypertension   . Obstructive chronic bronchitis with exacerbation (HCC) 08/29/2015  . Oxygen dependent    2L/MIN CONTINUOUS  . Palliative care by specialist   . Palpitations   . Seasonal allergies   . Shortness of breath dyspnea    Past Surgical History:  Procedure Laterality Date  . CATARACT EXTRACTION W/PHACO Left 11/04/2018   Procedure: CATARACT EXTRACTION PHACO AND INTRAOCULAR LENS PLACEMENT (IOC);  Surgeon: Galen ManilaPorfilio, William, MD;  Location: [facility] ORS;  Service: Ophthalmology;  Laterality: Left;  US 00:49.1 CDE 6.32 Fluid Pack Lot # [id number]  . ENDARTERECTOMY Left 06/23/2015   Procedure: ENDARTERECTOMY CAROTID;  Surgeon: Annice NeedyJason S Dew, MD;  Location: [facility] ORS;  Service: Vascular;  Laterality: Left;  . TUBAL LIGATION    . VAGINAL HYSTERECTOMY     Family History  Problem Relation Age of Onset  . Heart disease Father 34       CABG   . Hyperlipidemia Father   . Pneumonia Father   . Dementia Mother   . COPD Mother   . Diabetes Sister   . Parkinson's disease Brother   . Post-traumatic stress disorder Son    Allergies  Allergen Reactions  . Codeine Anxiety         Current Outpatient Medications:  .  acetaminophen (TYLENOL) 500 MG tablet, Take 1,000 mg by mouth every 6 (six) hours as needed for mild pain  or headache. , Disp: , Rfl:  .  albuterol (VENTOLIN HFA) 108 (90 Base) MCG/ACT inhaler, INHALE 1-2 PUFFS 4 TIMES A DAY AS NEEDED FOR WHEEZING (Patient taking differently: Inhale 2 puffs into the lungs every 6 (six) hours as needed for wheezing or shortness of breath. ), Disp: 18 Inhaler, Rfl: 2 .  alendronate (FOSAMAX) 70 MG tablet, Take 1 tablet (70 mg total) by mouth every 7 (seven) days. Take with a full glass of water on an empty stomach. (Patient taking differently: Take 70 mg by mouth every Monday. Take with a full glass of water on an empty stomach.), Disp: 12 tablet, Rfl: 4 .  amLODipine (NORVASC) 10 MG tablet, TAKE 1 TABLET BY MOUTH EVERY DAY (Patient taking differently: Take 10 mg by mouth daily. ), Disp: 90 tablet, Rfl: 3 .  aspirin 81 MG chewable tablet, Chew 81 mg by mouth at bedtime. , Disp: , Rfl:  .  atorvastatin (LIPITOR) 10 MG tablet, TAKE 1 TABLET BY MOUTH EVERY DAY (Patient taking differently: Take 10 mg by mouth daily. ), Disp: 90 tablet, Rfl: 3 .  B Complex-C-Folic Acid (HM SUPER VITAMIN B COMPLEX/C PO), Take 1 tablet by mouth daily. , Disp: , Rfl:  .  calcium carbonate (OSCAL) 1500 (600 Ca) MG TABS tablet, Take 600 mg of elemental calcium by mouth 2 (two) times daily. , Disp: , Rfl:  .  cetirizine (ZYRTEC) 10 MG tablet, Take 10 mg by mouth at bedtime. , Disp: , Rfl:  .  Cholecalciferol (VITAMIN D3) 2000 units TABS, Take 2,000 Units by mouth daily. , Disp: , Rfl:  .  ferrous sulfate 325 (65 FE) MG tablet, Take 325 mg by mouth daily with breakfast., Disp: , Rfl:  .  Multiple Vitamin (MULTIVITAMIN) tablet, Take 1 tablet by mouth at bedtime. , Disp: , Rfl:  .  sertraline (ZOLOFT) 100 MG tablet, 1 (ONE) TABLET, ORAL, AT BEDTIME DAILY BY MOUTH (Patient taking differently: Take 100 mg by mouth at bedtime. ), Disp: 90 tablet, Rfl: 3 .  SPIRIVA HANDIHALER 18 MCG inhalation capsule, INHALE 1 CAPSULE ONCE A DAY (Patient taking differently: Place 18 mcg into inhaler and inhale daily. ), Disp:  30 capsule, Rfl: 11 .  SYMBICORT 160-4.5 MCG/ACT inhaler, INHALE 2 PUFFS INTO THE LUNGS 2 (TWO) TIMES DAILY., Disp: 10.2 Inhaler, Rfl: 3 .  vitamin E 400 UNIT capsule, Take 400 Units by mouth daily., Disp: , Rfl:  .  potassium chloride SA (K-DUR,KLOR-CON) 20 MEQ tablet, Take 1 tablet (20 mEq total) by mouth daily. (Patient not taking: Reported on 11/11/2018), Disp: 30 tablet, Rfl: 3  Review of Systems  Constitutional: Negative for appetite change, chills, fatigue and fever.  Respiratory: Negative for chest tightness and shortness of breath.   Cardiovascular: Negative for chest pain and palpitations.  Gastrointestinal: Negative for abdominal pain, nausea and vomiting.  Neurological: Negative for dizziness and weakness.   Social History  Tobacco Use  . Smoking status: Current Every Day Smoker    Packs/day: 0.50    Years: 45.00    Pack years: 22.50    Types: Cigarettes  . Smokeless tobacco: Never Used  . Tobacco comment: give our take 0-1 pack a day / uses patch PRN  Substance Use Topics  . Alcohol use: No    Alcohol/week: 0.0 standard drinks   Objective:   BP 102/60 (BP Location: Right Arm, Patient Position: Sitting, Cuff Size: Normal)   Pulse (!) 101   Temp 97.7 F (36.5 C) (Oral)   Resp 16   Wt 126 lb (57.2 kg)   SpO2 (!) 88%   BMI 20.97 kg/m  Vitals:   11/11/18 1032  BP: 102/60  Pulse: (!) 101  Resp: 16  Temp: 97.7 F (36.5 C)  TempSrc: Oral  SpO2: (!) 88%  Weight: 126 lb (57.2 kg)   Physical Exam  Constitutional: She is oriented to person, place, and time. She appears well-developed and well-nourished. No distress.  HENT:  Head: Normocephalic and atraumatic.  Right Ear: Hearing normal.  Left Ear: Hearing normal.  Nose: Nose normal.  Eyes: Conjunctivae and lids are normal. Right eye exhibits no discharge. Left eye exhibits no discharge. No scleral icterus.  Cardiovascular: Normal rate and regular rhythm.  Pulmonary/Chest: Effort normal. No respiratory  distress. She has wheezes.  No rales or rhonchi. Wheezes and prolonged expiratory phase.  Musculoskeletal: Normal range of motion.  Neurological: She is alert and oriented to person, place, and time.  Skin: Skin is intact. No lesion and no rash noted.  Psychiatric: She has a normal mood and affect. Her speech is normal and behavior is normal. Thought content normal.      Assessment & Plan:     1. Essential hypertension BP well controlled. Tolerating Amlodipine 10 mg qd without peripheral edema, chest pains or palpitation. Check routine labs and follow up pending reports. - CBC with Differential/Platelet  2. Mixed hyperlipidemia Still taking the Lipitor 10 mg qd and trying to limit fats in diet. Will recheck CMP and Lipid Panel. Denies side effects from the statin. - Comprehensive metabolic panel - Lipid panel  3. Anxiety Irritability with stress of family moving into her house again. She states the Sertraline 100 mg at bedtime not working as well. She has started smoking again to get them to move out. Will continue Sertraline and get routine labs. Encouraged to stop all smoking. - CBC with Differential/Platelet - Comprehensive metabolic panel  4. COPD exacerbation (HCC) Pulse oximetry 88% on 2 LPM of oxygen by nasal cannula while walking to the exam room. Improved to 91% while sitting for 5-10 minutes. Dyspnea with exertion unchanged. Occasional yellowish sputum but no rales or rhonchi at the present. Some wheezes noted. Will check CBC and given Prednisone taper. Continues to use Spiriva 18 mcg by autohaler qd, Symbicort 160-4.5 mcg/act 2 puffs BID and Albuterol-HFA 1-2 puffs QID prn wheezing. Recheck pending lab reports. - CBC with Differential/Platelet - predniSONE (DELTASONE) 5 MG tablet; Take 1 tablet (5 mg total) by mouth daily with breakfast. Taper down by one tablet daily for 6 days (6,5,4,3,2,1)  Dispense: 21 tablet; Refill: 0       Hailey Kern, PA  [facility]

## 2018-11-11 ENCOUNTER — Encounter: Payer: Self-pay | Admitting: Family Medicine

## 2018-11-11 ENCOUNTER — Ambulatory Visit (INDEPENDENT_AMBULATORY_CARE_PROVIDER_SITE_OTHER): Payer: Medicare HMO | Admitting: Family Medicine

## 2018-11-11 VITALS — BP 102/60 | HR 101 | Temp 97.7°F | Resp 16 | Wt 126.0 lb

## 2018-11-11 DIAGNOSIS — E782 Mixed hyperlipidemia: Secondary | ICD-10-CM | POA: Diagnosis not present

## 2018-11-11 DIAGNOSIS — F419 Anxiety disorder, unspecified: Secondary | ICD-10-CM

## 2018-11-11 DIAGNOSIS — J441 Chronic obstructive pulmonary disease with (acute) exacerbation: Secondary | ICD-10-CM | POA: Diagnosis not present

## 2018-11-11 DIAGNOSIS — I10 Essential (primary) hypertension: Secondary | ICD-10-CM

## 2018-11-11 MED ORDER — PREDNISONE 5 MG PO TABS: 5.0000 mg | ORAL_TABLET | Freq: Every day | ORAL | 0 refills | Status: DC

## 2018-11-12 LAB — COMPREHENSIVE METABOLIC PANEL
A/G RATIO: 1.9 (ref 1.2–2.2)
ALBUMIN: 4.4 g/dL (ref 3.5–4.8)
ALK PHOS: 87 IU/L (ref 39–117)
ALT: 9 IU/L (ref 0–32)
AST: 18 IU/L (ref 0–40)
BILIRUBIN TOTAL: 0.2 mg/dL (ref 0.0–1.2)
BUN/Creatinine Ratio: 18 (ref 12–28)
BUN: 10 mg/dL (ref 8–27)
CHLORIDE: 92 mmol/L — AB (ref 96–106)
CO2: 34 mmol/L — ABNORMAL HIGH (ref 20–29)
Calcium: 9.4 mg/dL (ref 8.7–10.3)
Creatinine, Ser: 0.56 mg/dL — ABNORMAL LOW (ref 0.57–1.00)
GFR calc Af Amer: 108 mL/min/{1.73_m2} (ref >59)
GFR calc non Af Amer: 94 mL/min/{1.73_m2} (ref >59)
GLOBULIN, TOTAL: 2.3 g/dL (ref 1.5–4.5)
Glucose: 94 mg/dL (ref 65–99)
Potassium: 4.4 mmol/L (ref 3.5–5.2)
SODIUM: 140 mmol/L (ref 134–144)
Total Protein: 6.7 g/dL (ref 6.0–8.5)

## 2018-11-12 LAB — CBC WITH DIFFERENTIAL/PLATELET
BASOS: 1 %
Basophils Absolute: 0.1 10*3/uL (ref 0.0–0.2)
EOS (ABSOLUTE): 0.1 10*3/uL (ref 0.0–0.4)
EOS: 1 %
HEMATOCRIT: 36.2 % (ref 34.0–46.6)
HEMOGLOBIN: 11.8 g/dL (ref 11.1–15.9)
Immature Grans (Abs): 0 10*3/uL (ref 0.0–0.1)
Immature Granulocytes: 0 %
LYMPHS: 23 %
Lymphocytes Absolute: 1.9 10*3/uL (ref 0.7–3.1)
MCH: 31.1 pg (ref 26.6–33.0)
MCHC: 32.6 g/dL (ref 31.5–35.7)
MCV: 96 fL (ref 79–97)
MONOCYTES: 8 %
Monocytes Absolute: 0.6 10*3/uL (ref 0.1–0.9)
NEUTROS ABS: 5.6 10*3/uL (ref 1.4–7.0)
Neutrophils: 67 %
Platelets: 374 10*3/uL (ref 150–450)
RBC: 3.79 x10E6/uL (ref 3.77–5.28)
RDW: 10.9 % — ABNORMAL LOW (ref 12.3–15.4)
WBC: 8.3 10*3/uL (ref 3.4–10.8)

## 2018-11-12 LAB — LIPID PANEL
CHOLESTEROL TOTAL: 161 mg/dL (ref 100–199)
Chol/HDL Ratio: 2.3 ratio (ref 0.0–4.4)
HDL: 71 mg/dL (ref >39)
LDL Calculated: 69 mg/dL (ref 0–99)
Triglycerides: 105 mg/dL (ref 0–149)
VLDL Cholesterol Cal: 21 mg/dL (ref 5–40)

## 2018-11-13 ENCOUNTER — Telehealth: Payer: Self-pay | Admitting: *Deleted

## 2018-11-13 ENCOUNTER — Telehealth: Payer: Self-pay | Admitting: Family Medicine

## 2018-11-13 MED ORDER — CEFDINIR 300 MG PO CAPS: 300.0000 mg | ORAL_CAPSULE | Freq: Two times a day (BID) | ORAL | 0 refills | Status: DC

## 2018-11-13 NOTE — Telephone Encounter (Signed)
Patient was notified of results. Expressed understanding. Patient stated she is cough up light green sputum. Per Dennis sent rx for Cefdinir 300 mg bid to pharmacy.  

## 2018-11-13 NOTE — Telephone Encounter (Signed)
-----   Message from Tamsen Roersennis E Chrismon, [place] sent at 11/13/2018  9:41 AM EST ----- Cholesterol normal, good kidney and liver function. CO2 still elevated from COPD. CBC essentially normal without signs of infection now. Proceed with prednisone taper and if any purulent sputum production will need Cefdinir 300 mg BID #20. Recheck as needed.

## 2018-11-13 NOTE — Telephone Encounter (Signed)
No answer and no vm. Will try again later.  

## 2018-11-13 NOTE — Telephone Encounter (Signed)
Pt returned missed call. Please call pt back to disclose lab results.  Thanks, [employer]

## 2018-11-13 NOTE — Telephone Encounter (Signed)
Patient was notified of results. Expressed understanding. Patient stated she is cough up light green sputum. Per Maurine Ministerennis sent rx for Cefdinir 300 mg bid to pharmacy.

## 2018-11-14 ENCOUNTER — Telehealth: Payer: Self-pay | Admitting: Family Medicine

## 2018-11-14 NOTE — Telephone Encounter (Signed)
Patient stated she bought 5000 units of vitamin D3 by accident and wanted to know if it is ok for her to take them. Per Maurine Ministerennis patient was advised it is okay for her to take vitamin D3 5,000 units qd. Patient was also advised to check vitamin D level in 2 months. Patient expressed understanding.

## 2018-11-14 NOTE — Telephone Encounter (Signed)
Pt calling about her vitamin D.  Please call pt back to discuss.  Thanks, [employer]

## 2018-11-18 DIAGNOSIS — H2511 Age-related nuclear cataract, right eye: Secondary | ICD-10-CM | POA: Diagnosis not present

## 2018-11-19 ENCOUNTER — Encounter: Payer: Self-pay | Admitting: *Deleted

## 2018-11-22 DIAGNOSIS — J449 Chronic obstructive pulmonary disease, unspecified: Secondary | ICD-10-CM | POA: Diagnosis not present

## 2018-11-25 ENCOUNTER — Ambulatory Visit: Payer: Medicare HMO | Admitting: Anesthesiology

## 2018-11-25 ENCOUNTER — Encounter
Admission: RE | Disposition: A | Discharge status: Home / Self Care | Payer: Self-pay | Source: Home / Self Care | Attending: Ophthalmology

## 2018-11-25 ENCOUNTER — Encounter: Payer: Self-pay | Admitting: *Deleted

## 2018-11-25 ENCOUNTER — Ambulatory Visit
Admission: RE | Admit: 2018-11-25 | Discharge: 2018-11-25 | Disposition: A | Discharge status: Home / Self Care | Payer: Medicare HMO | Attending: Ophthalmology | Admitting: Ophthalmology

## 2018-11-25 DIAGNOSIS — F17200 Nicotine dependence, unspecified, uncomplicated: Secondary | ICD-10-CM | POA: Diagnosis not present

## 2018-11-25 DIAGNOSIS — D649 Anemia, unspecified: Secondary | ICD-10-CM | POA: Insufficient documentation

## 2018-11-25 DIAGNOSIS — F329 Major depressive disorder, single episode, unspecified: Secondary | ICD-10-CM | POA: Diagnosis not present

## 2018-11-25 DIAGNOSIS — J441 Chronic obstructive pulmonary disease with (acute) exacerbation: Secondary | ICD-10-CM | POA: Diagnosis not present

## 2018-11-25 DIAGNOSIS — Z79899 Other long term (current) drug therapy: Secondary | ICD-10-CM | POA: Diagnosis not present

## 2018-11-25 DIAGNOSIS — K219 Gastro-esophageal reflux disease without esophagitis: Secondary | ICD-10-CM | POA: Diagnosis not present

## 2018-11-25 DIAGNOSIS — J449 Chronic obstructive pulmonary disease, unspecified: Secondary | ICD-10-CM | POA: Insufficient documentation

## 2018-11-25 DIAGNOSIS — Z9981 Dependence on supplemental oxygen: Secondary | ICD-10-CM | POA: Insufficient documentation

## 2018-11-25 DIAGNOSIS — E7800 Pure hypercholesterolemia, unspecified: Secondary | ICD-10-CM | POA: Insufficient documentation

## 2018-11-25 DIAGNOSIS — H2511 Age-related nuclear cataract, right eye: Secondary | ICD-10-CM | POA: Insufficient documentation

## 2018-11-25 DIAGNOSIS — F418 Other specified anxiety disorders: Secondary | ICD-10-CM | POA: Diagnosis not present

## 2018-11-25 DIAGNOSIS — I10 Essential (primary) hypertension: Secondary | ICD-10-CM | POA: Diagnosis not present

## 2018-11-25 HISTORY — DX: Anemia, unspecified: D64.9

## 2018-11-25 HISTORY — PX: CATARACT EXTRACTION W/PHACO: SHX586

## 2018-11-25 HISTORY — DX: Cardiac arrhythmia, unspecified: I49.9

## 2018-11-25 SURGERY — PHACOEMULSIFICATION, CATARACT, WITH IOL INSERTION
Anesthesia: Monitor Anesthesia Care | Site: Eye | Laterality: Right

## 2018-11-25 MED ORDER — MOXIFLOXACIN HCL 0.5 % OP SOLN
OPHTHALMIC | Status: AC
  Filled 2018-11-25: qty 3

## 2018-11-25 MED ORDER — TETRACAINE HCL 0.5 % OP SOLN
OPHTHALMIC | Status: AC
  Administered 2018-11-25: 1 [drp] via OPHTHALMIC
  Filled 2018-11-25: qty 4

## 2018-11-25 MED ORDER — POVIDONE-IODINE 5 % OP SOLN
OPHTHALMIC | Status: DC | PRN
  Administered 2018-11-25: 1 via OPHTHALMIC

## 2018-11-25 MED ORDER — SODIUM CHLORIDE 0.9 % IV SOLN
INTRAVENOUS | Status: DC
  Administered 2018-11-25: 07:00:00 via INTRAVENOUS

## 2018-11-25 MED ORDER — CARBACHOL 0.01 % IO SOLN
INTRAOCULAR | Status: DC | PRN
  Administered 2018-11-25: .5 mL via INTRAOCULAR

## 2018-11-25 MED ORDER — ARMC OPHTHALMIC DILATING DROPS
OPHTHALMIC | Status: AC
  Administered 2018-11-25: 1 via OPHTHALMIC
  Filled 2018-11-25: qty 0.5

## 2018-11-25 MED ORDER — ARMC OPHTHALMIC DILATING DROPS
1.0000 "application " | OPHTHALMIC | Status: AC
  Administered 2018-11-25 (×3): 1 via OPHTHALMIC

## 2018-11-25 MED ORDER — MOXIFLOXACIN HCL 0.5 % OP SOLN: 1.0000 [drp] | OPHTHALMIC | Status: DC | PRN

## 2018-11-25 MED ORDER — MOXIFLOXACIN HCL 0.5 % OP SOLN
OPHTHALMIC | Status: DC | PRN
  Administered 2018-11-25: .2 mL via OPHTHALMIC

## 2018-11-25 MED ORDER — FENTANYL CITRATE (PF) 100 MCG/2ML IJ SOLN
INTRAMUSCULAR | Status: AC
  Filled 2018-11-25: qty 2

## 2018-11-25 MED ORDER — LIDOCAINE HCL (PF) 4 % IJ SOLN
INTRAOCULAR | Status: DC | PRN
  Administered 2018-11-25: 2 mL via OPHTHALMIC

## 2018-11-25 MED ORDER — TETRACAINE HCL 0.5 % OP SOLN
1.0000 [drp] | OPHTHALMIC | Status: AC | PRN
  Administered 2018-11-25 (×3): 1 [drp] via OPHTHALMIC

## 2018-11-25 MED ORDER — NA CHONDROIT SULF-NA HYALURON 40-17 MG/ML IO SOLN
INTRAOCULAR | Status: DC | PRN
  Administered 2018-11-25: 1 mL via INTRAOCULAR

## 2018-11-25 MED ORDER — EPINEPHRINE PF 1 MG/ML IJ SOLN
INTRAOCULAR | Status: DC | PRN
  Administered 2018-11-25: 1 mL via OPHTHALMIC

## 2018-11-25 MED ORDER — MIDAZOLAM HCL 2 MG/2ML IJ SOLN
INTRAMUSCULAR | Status: AC
  Filled 2018-11-25: qty 2

## 2018-11-25 SURGICAL SUPPLY — 16 items
GLOVE BIO SURGEON STRL SZ8 (GLOVE) ×3 IMPLANT
GLOVE BIOGEL M 6.5 STRL (GLOVE) ×3 IMPLANT
GLOVE SURG LX 8.0 MICRO (GLOVE) ×2
GLOVE SURG LX STRL 8.0 MICRO (GLOVE) ×1 IMPLANT
GOWN STRL REUS W/ TWL LRG LVL3 (GOWN DISPOSABLE) ×2 IMPLANT
GOWN STRL REUS W/TWL LRG LVL3 (GOWN DISPOSABLE) ×4
LABEL CATARACT MEDS ST (LABEL) ×3 IMPLANT
LENS IOL TECNIS ITEC 23.0 (Intraocular Lens) ×2 IMPLANT
PACK CATARACT (MISCELLANEOUS) ×3 IMPLANT
PACK CATARACT BRASINGTON LX (MISCELLANEOUS) ×3 IMPLANT
PACK EYE AFTER SURG (MISCELLANEOUS) ×3 IMPLANT
SOL BSS BAG (MISCELLANEOUS) ×3
SOLUTION BSS BAG (MISCELLANEOUS) ×1 IMPLANT
SYR 5ML LL (SYRINGE) ×3 IMPLANT
WATER STERILE IRR 250ML POUR (IV SOLUTION) ×3 IMPLANT
WIPE NON LINTING 3.25X3.25 (MISCELLANEOUS) ×3 IMPLANT

## 2018-11-25 NOTE — Transfer of Care (Signed)
Immediate Anesthesia Transfer of Care Note  Patient: Hailey Gibbs  Procedure(s) Performed: CATARACT EXTRACTION PHACO AND INTRAOCULAR LENS PLACEMENT (IOC) RIGHT (Right Eye)  Patient Location: PACU  Anesthesia Type:MAC  Level of Consciousness: awake, alert  and oriented  Airway & Oxygen Therapy: Patient Spontanous Breathing  Post-op Assessment: Report given to RN and Post -op Vital signs reviewed and stable  Post vital signs: Reviewed and stable  Last Vitals:  Vitals Value Taken Time  BP 152/58 11/25/2018  8:48 AM  Temp 36.3 C 11/25/2018  8:48 AM  Pulse 102 11/25/2018  8:48 AM  Resp 12 11/25/2018  8:48 AM  SpO2 97 % 11/25/2018  8:48 AM    Last Pain:  Vitals:   11/25/18 0703  TempSrc: Tympanic  PainSc: 0-No pain         Complications: No apparent anesthesia complications

## 2018-11-25 NOTE — Anesthesia Postprocedure Evaluation (Signed)
Anesthesia Post Note  Patient: Hailey Gibbs  Procedure(s) Performed: CATARACT EXTRACTION PHACO AND INTRAOCULAR LENS PLACEMENT (IOC) RIGHT (Right Eye)  Patient location during evaluation: PACU Anesthesia Type: MAC Level of consciousness: awake, awake and alert and oriented Pain management: pain level controlled Vital Signs Assessment: post-procedure vital signs reviewed and stable Respiratory status: spontaneous breathing and patient connected to nasal cannula oxygen Cardiovascular status: blood pressure returned to baseline Postop Assessment: no headache, no backache and no apparent nausea or vomiting Anesthetic complications: no     Last Vitals:  Vitals:   11/25/18 0703 11/25/18 0848  BP: (!) 135/56 (!) 152/58  Pulse: 97 (!) 102  Resp:  16  Temp: 36.4 C (!) 36.3 C  SpO2: 91% 98%    Last Pain:  Vitals:   11/25/18 0848  TempSrc: Temporal  PainSc: 0-No pain                 Cindra Austad Lorenza Chick

## 2018-11-25 NOTE — H&P (Signed)
All labs reviewed. Abnormal studies sent to patients PCP when indicated.  Previous H&P reviewed, patient examined, there are NO CHANGES.  Hailey Barnhart Porfilio12/17/20198:17 AM

## 2018-11-25 NOTE — Anesthesia Post-op Follow-up Note (Signed)
Anesthesia QCDR form completed.        

## 2018-11-25 NOTE — Op Note (Signed)
PREOPERATIVE DIAGNOSIS:  Nuclear sclerotic cataract of the right eye.   POSTOPERATIVE DIAGNOSIS:  nuclear sclerotic cataract right eye   OPERATIVE PROCEDURE: Procedure(s): CATARACT EXTRACTION PHACO AND INTRAOCULAR LENS PLACEMENT (IOC) RIGHT   SURGEON:  Galen ManilaWilliam Rital Cavey, MD.   ANESTHESIA:  Anesthesiologist: Lenard SimmerKarenz, Andrew, MD CRNA: Henrietta HooverPope, Kimberly, CRNA; Karoline CaldwellStarr, Deana, CRNA  1.      Managed anesthesia care. 2.      0.511ml of Shugarcaine was instilled in the eye following the paracentesis.   COMPLICATIONS:  None.   TECHNIQUE:   Stop and chop   DESCRIPTION OF PROCEDURE:  The patient was examined and consented in the preoperative holding area where the aforementioned topical anesthesia was applied to the right eye and then brought back to the Operating Room where the right eye was prepped and draped in the usual sterile ophthalmic fashion and a lid speculum was placed. A paracentesis was created with the side port blade and the anterior chamber was filled with viscoelastic. A near clear corneal incision was performed with the steel keratome. A continuous curvilinear capsulorrhexis was performed with a cystotome followed by the capsulorrhexis forceps. Hydrodissection and hydrodelineation were carried out with BSS on a blunt cannula. The lens was removed in a stop and chop  technique and the remaining cortical material was removed with the irrigation-aspiration handpiece. The capsular bag was inflated with viscoelastic and the Technis [id number]  lens was placed in the capsular bag without complication. The remaining viscoelastic was removed from the eye with the irrigation-aspiration handpiece. The wounds were hydrated. The anterior chamber was flushed with Miostat and the eye was inflated to physiologic pressure. 0.191ml of Vigamox was placed in the anterior chamber. The wounds were found to be water tight. The eye was dressed with Vigamox. The patient was given protective glasses to wear throughout the day  and a shield with which to sleep tonight. The patient was also given drops with which to begin a drop regimen today and will follow-up with me in one day. Implant Name Type Inv. Item Serial No. Manufacturer Lot No. LRB No. Used  LENS IOL DIOP 23.0 - [id number] Intraocular Lens LENS IOL DIOP 23.0 [id number]  Right 1   Procedure(s) with comments: CATARACT EXTRACTION PHACO AND INTRAOCULAR LENS PLACEMENT (IOC) RIGHT (Right) - US   00:51 CDE  8.93 Fluid pack lot #[id number]  Electronically signed: Galen ManilaWilliam Rayhana Slider 11/25/2018 8:47 AM

## 2018-11-25 NOTE — Discharge Instructions (Addendum)
Eye Surgery Discharge Instructions  Expect mild scratchy sensation or mild soreness. DO NOT RUB YOUR EYE!  The day of surgery:  Minimal physical activity, but bed rest is not required  No reading, computer work, or close hand work  No bending, lifting, or straining.  May watch TV  For 24 hours:  No driving, legal decisions, or alcoholic beverages  Safety precautions  Eat anything you prefer: It is better to start with liquids, then soup then solid foods.  Solar shield eyeglasses should be worn for comfort in the sunlight/patch while sleeping  Resume all regular medications including aspirin or Coumadin if these were discontinued prior to surgery. You may shower, bathe, shave, or wash your hair. Tylenol may be taken for mild discomfort. Follow Dr. Gerome SamPorfilio's eye drop instruction sheet as reviewed.  Call your doctor if you experience significant pain, nausea, or vomiting, fever > 101 or other signs of infection. [phone] or [phone] Specific instructions:  Follow-up Information    Galen ManilaPorfilio, William, MD Follow up.   Specialty:  Ophthalmology Why:  11/26/18 @ 8:30 AM Contact information: [address] [place] [address] [phone]

## 2018-11-25 NOTE — Anesthesia Preprocedure Evaluation (Signed)
Anesthesia Evaluation  Patient identified by MRN, date of birth, ID band Patient awake    Reviewed: Allergy & Precautions, H&P , NPO status , Patient's Chart, lab work & pertinent test results  History of Anesthesia Complications Negative for: history of anesthetic complications  Airway Mallampati: III       Dental  (+) Upper Dentures, Lower Dentures   Pulmonary shortness of breath, COPD (wears 2L [place] ATC),  oxygen dependent, Current Smoker,  No recent illnesses or COPD exacerbations.  SOB is at baseline          Cardiovascular hypertension, + CAD and + Orthopnea       Neuro/Psych PSYCHIATRIC DISORDERS Anxiety Depression negative neurological ROS     GI/Hepatic Neg liver ROS, GERD  ,  Endo/Other  negative endocrine ROS  Renal/GU      Musculoskeletal   Abdominal   Peds  Hematology negative hematology ROS (+)   Anesthesia Other Findings Past Medical History: 08/29/2015: Abnormal chest sounds No date: Acute respiratory failure (HCC) No date: Anxiety 08/29/2015: BP (high blood pressure) No date: Carotid artery occlusion 06/23/2015: Carotid stenosis No date: COPD (chronic obstructive pulmonary disease) ([place]) 10/26/2016: COPD exacerbation (HCC) No date: Coronary artery disease No date: Depression No date: GERD (gastroesophageal reflux disease) No date: History of orthopnea No date: HOH (hard of hearing) No date: Hyperlipidemia No date: Hypertension 08/29/2015: Obstructive chronic bronchitis with exacerbation (HCC) No date: Oxygen dependent     Comment:  2L/MIN CONTINUOUS No date: Palliative care by specialist No date: Palpitations No date: Seasonal allergies No date: Shortness of breath dyspnea  Past Surgical History: 06/23/2015: ENDARTERECTOMY; Left     Comment:  Procedure: ENDARTERECTOMY CAROTID;  Surgeon: Algernon Huxley, MD;  Location: [facility] ORS;  Service: Vascular;                Laterality:  Left; No date: TUBAL LIGATION No date: VAGINAL HYSTERECTOMY  BMI    Body Mass Index:  22.64 kg/m      Reproductive/Obstetrics negative OB ROS                             Anesthesia Physical  Anesthesia Plan  ASA: III  Anesthesia Plan: MAC   Post-op Pain Management:    Induction:   PONV Risk Score and Plan:   Airway Management Planned:   Additional Equipment:   Intra-op Plan:   Post-operative Plan:   Informed Consent: I have reviewed the patients History and Physical, chart, labs and discussed the procedure including the risks, benefits and alternatives for the proposed anesthesia with the patient or authorized representative who has indicated his/her understanding and acceptance.     Plan Discussed with: Anesthesiologist and CRNA  Anesthesia Plan Comments: (Specifically discussed DNR.  Pt denies ever saying she wanted to be DNR.  She is full code.)        Anesthesia Quick Evaluation

## 2018-11-30 DIAGNOSIS — J449 Chronic obstructive pulmonary disease, unspecified: Secondary | ICD-10-CM | POA: Diagnosis not present

## 2018-12-08 ENCOUNTER — Emergency Department: Payer: Medicare HMO

## 2018-12-08 ENCOUNTER — Other Ambulatory Visit: Payer: Self-pay

## 2018-12-08 ENCOUNTER — Inpatient Hospital Stay
Admission: EM | Admit: 2018-12-08 | Discharge: 2018-12-10 | DRG: 190 | Disposition: A | Discharge status: Home Health | Payer: Medicare HMO | Attending: Specialist | Admitting: Specialist

## 2018-12-08 ENCOUNTER — Encounter: Payer: Self-pay | Admitting: Intensive Care

## 2018-12-08 DIAGNOSIS — I10 Essential (primary) hypertension: Secondary | ICD-10-CM | POA: Diagnosis present

## 2018-12-08 DIAGNOSIS — F17210 Nicotine dependence, cigarettes, uncomplicated: Secondary | ICD-10-CM | POA: Diagnosis present

## 2018-12-08 DIAGNOSIS — R0902 Hypoxemia: Secondary | ICD-10-CM | POA: Diagnosis not present

## 2018-12-08 DIAGNOSIS — Z825 Family history of asthma and other chronic lower respiratory diseases: Secondary | ICD-10-CM

## 2018-12-08 DIAGNOSIS — Z961 Presence of intraocular lens: Secondary | ICD-10-CM | POA: Diagnosis present

## 2018-12-08 DIAGNOSIS — Z9851 Tubal ligation status: Secondary | ICD-10-CM | POA: Diagnosis not present

## 2018-12-08 DIAGNOSIS — M810 Age-related osteoporosis without current pathological fracture: Secondary | ICD-10-CM | POA: Diagnosis present

## 2018-12-08 DIAGNOSIS — Z9981 Dependence on supplemental oxygen: Secondary | ICD-10-CM

## 2018-12-08 DIAGNOSIS — J9621 Acute and chronic respiratory failure with hypoxia: Secondary | ICD-10-CM | POA: Diagnosis not present

## 2018-12-08 DIAGNOSIS — I2510 Atherosclerotic heart disease of native coronary artery without angina pectoris: Secondary | ICD-10-CM | POA: Diagnosis not present

## 2018-12-08 DIAGNOSIS — H9190 Unspecified hearing loss, unspecified ear: Secondary | ICD-10-CM | POA: Diagnosis present

## 2018-12-08 DIAGNOSIS — J441 Chronic obstructive pulmonary disease with (acute) exacerbation: Secondary | ICD-10-CM | POA: Diagnosis not present

## 2018-12-08 DIAGNOSIS — Z7983 Long term (current) use of bisphosphonates: Secondary | ICD-10-CM | POA: Diagnosis not present

## 2018-12-08 DIAGNOSIS — E785 Hyperlipidemia, unspecified: Secondary | ICD-10-CM | POA: Diagnosis present

## 2018-12-08 DIAGNOSIS — J440 Chronic obstructive pulmonary disease with acute lower respiratory infection: Secondary | ICD-10-CM | POA: Diagnosis present

## 2018-12-08 DIAGNOSIS — Z9841 Cataract extraction status, right eye: Secondary | ICD-10-CM | POA: Diagnosis not present

## 2018-12-08 DIAGNOSIS — Z7951 Long term (current) use of inhaled steroids: Secondary | ICD-10-CM | POA: Diagnosis not present

## 2018-12-08 DIAGNOSIS — R05 Cough: Secondary | ICD-10-CM | POA: Diagnosis not present

## 2018-12-08 DIAGNOSIS — R0602 Shortness of breath: Secondary | ICD-10-CM | POA: Diagnosis not present

## 2018-12-08 DIAGNOSIS — Z8249 Family history of ischemic heart disease and other diseases of the circulatory system: Secondary | ICD-10-CM

## 2018-12-08 DIAGNOSIS — Z87440 Personal history of urinary (tract) infections: Secondary | ICD-10-CM | POA: Diagnosis not present

## 2018-12-08 DIAGNOSIS — F329 Major depressive disorder, single episode, unspecified: Secondary | ICD-10-CM | POA: Diagnosis not present

## 2018-12-08 DIAGNOSIS — K219 Gastro-esophageal reflux disease without esophagitis: Secondary | ICD-10-CM | POA: Diagnosis present

## 2018-12-08 DIAGNOSIS — Z79899 Other long term (current) drug therapy: Secondary | ICD-10-CM | POA: Diagnosis not present

## 2018-12-08 DIAGNOSIS — R069 Unspecified abnormalities of breathing: Secondary | ICD-10-CM | POA: Diagnosis not present

## 2018-12-08 DIAGNOSIS — Z23 Encounter for immunization: Secondary | ICD-10-CM | POA: Diagnosis not present

## 2018-12-08 DIAGNOSIS — Z7982 Long term (current) use of aspirin: Secondary | ICD-10-CM

## 2018-12-08 DIAGNOSIS — J209 Acute bronchitis, unspecified: Secondary | ICD-10-CM | POA: Diagnosis not present

## 2018-12-08 DIAGNOSIS — Z90710 Acquired absence of both cervix and uterus: Secondary | ICD-10-CM

## 2018-12-08 DIAGNOSIS — J449 Chronic obstructive pulmonary disease, unspecified: Secondary | ICD-10-CM | POA: Diagnosis not present

## 2018-12-08 DIAGNOSIS — Z885 Allergy status to narcotic agent status: Secondary | ICD-10-CM | POA: Diagnosis not present

## 2018-12-08 DIAGNOSIS — R0689 Other abnormalities of breathing: Secondary | ICD-10-CM | POA: Diagnosis not present

## 2018-12-08 DIAGNOSIS — Z9842 Cataract extraction status, left eye: Secondary | ICD-10-CM | POA: Diagnosis not present

## 2018-12-08 DIAGNOSIS — J9610 Chronic respiratory failure, unspecified whether with hypoxia or hypercapnia: Secondary | ICD-10-CM | POA: Diagnosis not present

## 2018-12-08 DIAGNOSIS — R531 Weakness: Secondary | ICD-10-CM | POA: Diagnosis not present

## 2018-12-08 DIAGNOSIS — Z8349 Family history of other endocrine, nutritional and metabolic diseases: Secondary | ICD-10-CM

## 2018-12-08 HISTORY — DX: Age-related osteoporosis without current pathological fracture: M81.0

## 2018-12-08 LAB — BASIC METABOLIC PANEL
Anion gap: 9 (ref 5–15)
BUN: 12 mg/dL (ref 8–23)
CO2: 41 mmol/L — ABNORMAL HIGH (ref 22–32)
Calcium: 10.1 mg/dL (ref 8.9–10.3)
Chloride: 88 mmol/L — ABNORMAL LOW (ref 98–111)
Creatinine, Ser: 0.57 mg/dL (ref 0.44–1.00)
GFR calc Af Amer: >60 mL/min (ref >60)
GFR calc non Af Amer: >60 mL/min (ref >60)
GLUCOSE: 109 mg/dL — AB (ref 70–99)
Potassium: 3.3 mmol/L — ABNORMAL LOW (ref 3.5–5.1)
Sodium: 138 mmol/L (ref 135–145)

## 2018-12-08 LAB — CBC
HEMATOCRIT: 37.5 % (ref 36.0–46.0)
HEMOGLOBIN: 11.5 g/dL — AB (ref 12.0–15.0)
MCH: 31.4 pg (ref 26.0–34.0)
MCHC: 30.7 g/dL (ref 30.0–36.0)
MCV: 102.5 fL — ABNORMAL HIGH (ref 80.0–100.0)
Platelets: 392 10*3/uL (ref 150–400)
RBC: 3.66 MIL/uL — ABNORMAL LOW (ref 3.87–5.11)
RDW: 11.9 % (ref 11.5–15.5)
WBC: 11.7 10*3/uL — ABNORMAL HIGH (ref 4.0–10.5)
nRBC: 0 % (ref 0.0–0.2)

## 2018-12-08 LAB — TROPONIN I
Troponin I: <0.03 ng/mL (ref <0.03)
Troponin I: <0.03 ng/mL (ref <0.03)

## 2018-12-08 LAB — INFLUENZA PANEL BY PCR (TYPE A & B)
Influenza A By PCR: NEGATIVE
Influenza B By PCR: NEGATIVE

## 2018-12-08 MED ORDER — SERTRALINE HCL 50 MG PO TABS
100.0000 mg | ORAL_TABLET | Freq: Every day | ORAL | Status: DC
  Administered 2018-12-08 – 2018-12-09 (×2): 100 mg via ORAL
  Filled 2018-12-08 (×2): qty 2

## 2018-12-08 MED ORDER — ATORVASTATIN CALCIUM 20 MG PO TABS
10.0000 mg | ORAL_TABLET | Freq: Every day | ORAL | Status: DC
  Administered 2018-12-08 – 2018-12-09 (×2): 10 mg via ORAL
  Filled 2018-12-08 (×3): qty 1

## 2018-12-08 MED ORDER — METHYLPREDNISOLONE SODIUM SUCC 125 MG IJ SOLR
125.0000 mg | Freq: Once | INTRAMUSCULAR | Status: AC
  Administered 2018-12-08: 125 mg via INTRAVENOUS
  Filled 2018-12-08: qty 2

## 2018-12-08 MED ORDER — BISACODYL 5 MG PO TBEC
5.0000 mg | DELAYED_RELEASE_TABLET | Freq: Every day | ORAL | Status: DC | PRN
  Administered 2018-12-09: 5 mg via ORAL
  Filled 2018-12-08: qty 1

## 2018-12-08 MED ORDER — ASPIRIN 81 MG PO CHEW
81.0000 mg | CHEWABLE_TABLET | Freq: Every day | ORAL | Status: DC
  Administered 2018-12-09: 81 mg via ORAL
  Filled 2018-12-08: qty 1

## 2018-12-08 MED ORDER — IPRATROPIUM-ALBUTEROL 0.5-2.5 (3) MG/3ML IN SOLN
3.0000 mL | Freq: Once | RESPIRATORY_TRACT | Status: AC
  Administered 2018-12-08: 3 mL via RESPIRATORY_TRACT
  Filled 2018-12-08: qty 3

## 2018-12-08 MED ORDER — SALINE SPRAY 0.65 % NA SOLN
1.0000 | NASAL | Status: DC | PRN
  Filled 2018-12-08: qty 44

## 2018-12-08 MED ORDER — ENOXAPARIN SODIUM 40 MG/0.4ML ~~LOC~~ SOLN
40.0000 mg | SUBCUTANEOUS | Status: DC
  Administered 2018-12-08 – 2018-12-09 (×2): 40 mg via SUBCUTANEOUS
  Filled 2018-12-08 (×2): qty 0.4

## 2018-12-08 MED ORDER — METHYLPREDNISOLONE SODIUM SUCC 40 MG IJ SOLR
40.0000 mg | Freq: Two times a day (BID) | INTRAMUSCULAR | Status: DC
  Administered 2018-12-09 – 2018-12-10 (×3): 40 mg via INTRAVENOUS
  Filled 2018-12-08 (×3): qty 1

## 2018-12-08 MED ORDER — ADULT MULTIVITAMIN W/MINERALS CH
1.0000 | ORAL_TABLET | Freq: Every day | ORAL | Status: DC
  Administered 2018-12-08 – 2018-12-09 (×2): 1 via ORAL
  Filled 2018-12-08 (×2): qty 1

## 2018-12-08 MED ORDER — CALCIUM CARBONATE ANTACID 500 MG PO CHEW
600.0000 mg | CHEWABLE_TABLET | Freq: Two times a day (BID) | ORAL | Status: DC
  Administered 2018-12-08 – 2018-12-10 (×4): 600 mg via ORAL
  Filled 2018-12-08 (×4): qty 3

## 2018-12-08 MED ORDER — BUDESONIDE 0.5 MG/2ML IN SUSP
0.5000 mg | Freq: Two times a day (BID) | RESPIRATORY_TRACT | Status: DC
  Administered 2018-12-08 – 2018-12-10 (×4): 0.5 mg via RESPIRATORY_TRACT
  Filled 2018-12-08 (×5): qty 2

## 2018-12-08 MED ORDER — AZITHROMYCIN 500 MG PO TABS
250.0000 mg | ORAL_TABLET | Freq: Every day | ORAL | Status: DC
  Administered 2018-12-09 – 2018-12-10 (×2): 250 mg via ORAL
  Filled 2018-12-08 (×2): qty 1

## 2018-12-08 MED ORDER — IPRATROPIUM-ALBUTEROL 0.5-2.5 (3) MG/3ML IN SOLN
3.0000 mL | Freq: Four times a day (QID) | RESPIRATORY_TRACT | Status: DC
  Administered 2018-12-08 – 2018-12-10 (×7): 3 mL via RESPIRATORY_TRACT
  Filled 2018-12-08 (×8): qty 3

## 2018-12-08 MED ORDER — LORATADINE 10 MG PO TABS
10.0000 mg | ORAL_TABLET | Freq: Every day | ORAL | Status: DC
  Administered 2018-12-09 – 2018-12-10 (×2): 10 mg via ORAL
  Filled 2018-12-08 (×2): qty 1

## 2018-12-08 MED ORDER — ACETAMINOPHEN 650 MG RE SUPP: 650.0000 mg | Freq: Four times a day (QID) | RECTAL | Status: DC | PRN

## 2018-12-08 MED ORDER — POTASSIUM CHLORIDE CRYS ER 20 MEQ PO TBCR
40.0000 meq | EXTENDED_RELEASE_TABLET | Freq: Once | ORAL | Status: AC
  Administered 2018-12-08: 40 meq via ORAL
  Filled 2018-12-08: qty 2

## 2018-12-08 MED ORDER — HYDROCODONE-ACETAMINOPHEN 5-325 MG PO TABS: 1.0000 | ORAL_TABLET | Freq: Two times a day (BID) | ORAL | Status: DC | PRN

## 2018-12-08 MED ORDER — VITAMIN E 180 MG (400 UNIT) PO CAPS
400.0000 [IU] | ORAL_CAPSULE | Freq: Every day | ORAL | Status: DC
  Administered 2018-12-08 – 2018-12-09 (×2): 400 [IU] via ORAL
  Filled 2018-12-08 (×3): qty 1

## 2018-12-08 MED ORDER — ACETAMINOPHEN 325 MG PO TABS
650.0000 mg | ORAL_TABLET | Freq: Four times a day (QID) | ORAL | Status: DC | PRN
  Administered 2018-12-08 – 2018-12-09 (×3): 650 mg via ORAL
  Filled 2018-12-08 (×3): qty 2

## 2018-12-08 MED ORDER — AMLODIPINE BESYLATE 10 MG PO TABS
10.0000 mg | ORAL_TABLET | Freq: Every evening | ORAL | Status: DC
  Administered 2018-12-08 – 2018-12-09 (×2): 10 mg via ORAL
  Filled 2018-12-08 (×2): qty 1

## 2018-12-08 MED ORDER — FERROUS SULFATE 325 (65 FE) MG PO TABS
325.0000 mg | ORAL_TABLET | Freq: Every day | ORAL | Status: DC
  Administered 2018-12-08 – 2018-12-09 (×2): 325 mg via ORAL
  Filled 2018-12-08 (×2): qty 1

## 2018-12-08 MED ORDER — AZITHROMYCIN 500 MG PO TABS
500.0000 mg | ORAL_TABLET | Freq: Every day | ORAL | Status: AC
  Administered 2018-12-08: 500 mg via ORAL
  Filled 2018-12-08: qty 1

## 2018-12-08 MED ORDER — ACETAMINOPHEN 500 MG PO TABS: 1000.0000 mg | ORAL_TABLET | Freq: Four times a day (QID) | ORAL | Status: DC | PRN

## 2018-12-08 MED ORDER — VITAMIN D 25 MCG (1000 UNIT) PO TABS
2000.0000 [IU] | ORAL_TABLET | Freq: Every day | ORAL | Status: DC
  Administered 2018-12-08 – 2018-12-09 (×2): 2000 [IU] via ORAL
  Filled 2018-12-08 (×2): qty 2

## 2018-12-08 MED ORDER — MAGNESIUM SULFATE 2 GM/50ML IV SOLN
2.0000 g | Freq: Once | INTRAVENOUS | Status: AC
  Administered 2018-12-08: 2 g via INTRAVENOUS
  Filled 2018-12-08: qty 50

## 2018-12-08 NOTE — H&P (Signed)
[facility] [place] [facility]   PATIENT NAME: Hailey Gibbs    MR#:  [id number]  DATE OF BIRTH:  February 11, 1946  DATE OF ADMISSION:  12/08/2018  PRIMARY CARE PHYSICIAN: Chrismon, Jodell Cipro, PA   REQUESTING/REFERRING PHYSICIAN: Dr Lenard Lance  CHIEF COMPLAINT:   Chief Complaint  Patient presents with  . Shortness of Breath  . Weakness    HISTORY OF PRESENT ILLNESS:  Hailey Gibbs  is a 72 y.o. female with a known history of COPD on chronic oxygen 2 L presents with shortness of breath.  A few weeks ago she started developing a cough.  Over the last 3 days she states her pulse ox has been in the 70s and 80s at home.  She has been sleeping only 2 to 3 hours per night she has been having chest tightness 8 out of 10 in intensity all the time constant.  She has been sleeping sitting up.  She has been having nasal congestion.  Her cough is bringing up green phlegm.  She has been using her rescue inhaler more often.  PAST MEDICAL HISTORY:   Past Medical History:  Diagnosis Date  . Abnormal chest sounds 08/29/2015  . Acute respiratory failure (HCC)   . Anemia   . Anxiety   . BP (high blood pressure) 08/29/2015  . Carotid artery occlusion   . Carotid stenosis 06/23/2015  . COPD (chronic obstructive pulmonary disease) (HCC)   . COPD exacerbation (HCC) 10/26/2016  . Coronary artery disease   . Depression   . Dysrhythmia   . GERD (gastroesophageal reflux disease)   . History of orthopnea   . HOH (hard of hearing)   . Hyperlipidemia   . Hypertension   . Obstructive chronic bronchitis with exacerbation (HCC) 08/29/2015  . Oxygen dependent    2L/MIN CONTINUOUS  . Palliative care by specialist   . Palpitations   . Seasonal allergies   . Shortness of breath dyspnea     PAST SURGICAL HISTORY:   Past Surgical History:  Procedure Laterality Date  . CATARACT EXTRACTION W/PHACO Left 11/04/2018   Procedure: CATARACT EXTRACTION PHACO AND INTRAOCULAR LENS  PLACEMENT (IOC);  Surgeon: Galen Manila, MD;  Location: [facility] ORS;  Service: Ophthalmology;  Laterality: Left;  [place] 00:49.1 CDE 6.32 Fluid Pack Lot # [id number]  . CATARACT EXTRACTION W/PHACO Right 11/25/2018   Procedure: CATARACT EXTRACTION PHACO AND INTRAOCULAR LENS PLACEMENT (IOC) RIGHT;  Surgeon: Galen Manila, MD;  Location: [facility] ORS;  Service: Ophthalmology;  Laterality: Right;  [place]   00:51 CDE  8.93 Fluid pack lot #[id number]  . ENDARTERECTOMY Left 06/23/2015   Procedure: ENDARTERECTOMY CAROTID;  Surgeon: Annice Needy, MD;  Location: [facility] ORS;  Service: Vascular;  Laterality: Left;  . TUBAL LIGATION    . VAGINAL HYSTERECTOMY      SOCIAL HISTORY:   Social History   Tobacco Use  . Smoking status: Current Every Day Smoker    Packs/day: 0.50    Years: 45.00    Pack years: 22.50    Types: Cigarettes  . Smokeless tobacco: Never Used  . Tobacco comment: give our take 0-1 pack a day / uses patch PRN  Substance Use Topics  . Alcohol use: No    Alcohol/week: 0.0 standard drinks    FAMILY HISTORY:   Family History  Problem Relation Age of Onset  . Heart disease Father 68       CABG   . Hyperlipidemia Father   . Pneumonia Father   . Dementia  Mother   . COPD Mother   . Diabetes Sister   . Parkinson's disease Brother   . Post-traumatic stress disorder Son     DRUG ALLERGIES:   Allergies  Allergen Reactions  . Codeine Anxiety         REVIEW OF SYSTEMS:  CONSTITUTIONAL: No fever, positive chills and sweats.  Positive for weight loss.  Positive for fatigue.  EYES: No blurred or double vision.  EARS, NOSE, AND THROAT: No tinnitus or ear pain.  Positive for runny nose.  No sore throat.  Decreased hearing RESPIRATORY: Positive for cough, shortness of breath, and wheezing.no hemoptysis.  CARDIOVASCULAR: Positive for chest pain, orthopnea.  No edema.  GASTROINTESTINAL: No nausea, vomiting, diarrhea or abdominal pain. No blood in bowel movements.  History of  constipation GENITOURINARY: No dysuria, hematuria.  ENDOCRINE: No polyuria, nocturia,  HEMATOLOGY: No anemia, easy bruising or bleeding SKIN: No rash or lesion. MUSCULOSKELETAL: No joint pain or arthritis.   NEUROLOGIC: No tingling, numbness, weakness.  PSYCHIATRY: No anxiety or depression.   MEDICATIONS AT HOME:   Prior to Admission medications   Medication Sig Start Date End Date Taking? Authorizing Provider  acetaminophen (TYLENOL) 500 MG tablet Take 1,000 mg by mouth every 6 (six) hours as needed for mild pain or headache.     [provider]  albuterol (VENTOLIN HFA) 108 (90 Base) MCG/ACT inhaler INHALE 1-2 PUFFS 4 TIMES A DAY AS NEEDED FOR WHEEZING Patient taking differently: Inhale 2 puffs into the lungs every 6 (six) hours as needed for wheezing or shortness of breath.  07/22/18   Chrismon, Jodell Ciproennis E, PA  alendronate (FOSAMAX) 70 MG tablet Take 1 tablet (70 mg total) by mouth every 7 (seven) days. Take with a full glass of water on an empty stomach. Patient taking differently: Take 70 mg by mouth every Monday. Take with a full glass of water on an empty stomach. 09/01/18   Chrismon, Jodell Ciproennis E, PA  amLODipine (NORVASC) 10 MG tablet TAKE 1 TABLET BY MOUTH EVERY DAY Patient taking differently: Take 10 mg by mouth every evening.  06/02/18   Chrismon, Jodell Ciproennis E, PA  aspirin 81 MG chewable tablet Chew 81 mg by mouth at bedtime.     [provider]  atorvastatin (LIPITOR) 10 MG tablet TAKE 1 TABLET BY MOUTH EVERY DAY Patient taking differently: Take 10 mg by mouth at bedtime.  06/02/18   Chrismon, Jodell Ciproennis E, PA  bisacodyl (BISACODYL) 5 MG EC tablet Take 5 mg by mouth daily as needed for moderate constipation.    [provider]  calcium carbonate (OSCAL) 1500 (600 Ca) MG TABS tablet Take 600 mg of elemental calcium by mouth 2 (two) times daily.     [provider]  cetirizine (ZYRTEC) 10 MG tablet Take 10 mg by mouth at bedtime.     [provider]   Cholecalciferol (VITAMIN D3) 2000 units TABS Take 2,000 Units by mouth at bedtime.     [provider]  ferrous sulfate 325 (65 FE) MG tablet Take 325 mg by mouth at bedtime.     [provider]  HYDROcodone-acetaminophen (NORCO/VICODIN) 5-325 MG tablet Take 1 tablet by mouth 2 (two) times daily as needed for moderate pain.    [provider]  Multiple Vitamin (MULTIVITAMIN) tablet Take 1 tablet by mouth at bedtime.     [provider]  sertraline (ZOLOFT) 100 MG tablet 1 (ONE) TABLET, ORAL, AT BEDTIME DAILY BY MOUTH Patient taking differently: Take 100 mg by  mouth at bedtime.  07/06/18   Chrismon, Jodell Ciproennis E, PA  sodium chloride (OCEAN) 0.65 % SOLN nasal spray Place 1 spray into both nostrils as needed (dryness).    [provider]  SPIRIVA HANDIHALER 18 MCG inhalation capsule INHALE 1 CAPSULE ONCE A DAY Patient taking differently: Place 18 mcg into inhaler and inhale daily.  02/10/18   Chrismon, Jodell Ciproennis E, PA  SUPER B COMPLEX/C PO Take 1 tablet by mouth at bedtime.    [provider]  SYMBICORT 160-4.5 MCG/ACT inhaler INHALE 2 PUFFS INTO THE LUNGS 2 (TWO) TIMES DAILY. 11/10/18   Chrismon, Jodell Ciproennis E, PA  vitamin E 400 UNIT capsule Take 400 Units by mouth at bedtime.     [provider]      VITAL SIGNS:  Blood pressure 125/77, pulse 94, temperature 98.3 F (36.8 C), temperature source Oral, resp. rate (!) 22, height 5\' 5"  (1.651 m), weight 57.2 kg, SpO2 92 %.  PHYSICAL EXAMINATION:  GENERAL:  72 y.o.-year-old patient lying in the bed with slight respiratory acute distress.  EYES: Pupils equal, round, reactive to light and accommodation. No scleral icterus. Extraocular muscles intact.  HEENT: Head atraumatic, normocephalic. Oropharynx and nasopharynx clear.  NECK:  Supple, no jugular venous distention. No thyroid enlargement, no tenderness.  LUNGS: Decreased breath sounds bilaterally, positive expiratory wheezing.  No rales,rhonchi or  crepitation.  Slight use of accessory muscles of respiration.  CARDIOVASCULAR: S1, S2 normal. No murmurs, rubs, or gallops.  ABDOMEN: Soft, nontender, nondistended. Bowel sounds present. No organomegaly or mass.  EXTREMITIES: No pedal edema, cyanosis, or clubbing.  NEUROLOGIC: Cranial nerves II through XII are intact. Muscle strength 5/5 in all extremities. Sensation intact. Gait not checked.  PSYCHIATRIC: The patient is alert and oriented x 3.  SKIN: No rash, lesion, or ulcer.   LABORATORY PANEL:   CBC Recent Labs  Lab 12/08/18 1323  WBC 11.7*  HGB 11.5*  HCT 37.5  PLT 392   ------------------------------------------------------------------------------------------------------------------  Chemistries  Recent Labs  Lab 12/08/18 1323  NA 138  K 3.3*  CL 88*  CO2 41*  GLUCOSE 109*  BUN 12  CREATININE 0.57  CALCIUM 10.1   ------------------------------------------------------------------------------------------------------------------  Cardiac Enzymes Recent Labs  Lab 12/08/18 1323  TROPONINI <0.03   ------------------------------------------------------------------------------------------------------------------  RADIOLOGY:  Dg Chest 2 View  Result Date: 12/08/2018 CLINICAL DATA:  Shortness of breath.  Chest tightness. EXAM: CHEST - 2 VIEW COMPARISON:  02/11/2018 01/14/2018.  11/07/2016. FINDINGS: Mediastinum and hilar structures normal. Heart size normal. Mild bibasilar subsegmental atelectasis and or scarring. Stable mild thoracic and lumbar compression fractures. IMPRESSION: Mild bibasilar subsegmental atelectasis and or scarring. Electronically Signed   By: Maisie Fushomas  Register   On: 12/08/2018 13:42    EKG:   Normal sinus rhythm 95 bpm, left atrial enlargement, flattening of T waves laterally  IMPRESSION AND PLAN:   1.  COPD exacerbation.  Start IV Solu-Medrol, budesonide and DuoNeb nebulizer solution.  Empiric Zithromax.  Send off flu swab.  Chest pain likely  secondary to COPD exacerbation.  Steroid should help.  Serial cardiac enzymes and telemetry monitoring. 2.  Chronic respiratory failure oxygen requirements went up to 3 L. 3.  Essential hypertension on Norvasc 4.  Hyperlipidemia unspecified on Lipitor 5.  Depression on Zoloft 6.  Osteoporosis on Fosamax  All the records are reviewed and case discussed with ED provider. Management plans discussed with the patient, family and they are in agreement.  CODE STATUS: Full code  TOTAL TIME TAKING CARE OF THIS PATIENT:  50 minutes.    Alford Highland M.D on 12/08/2018 at 5:33 PM  Between 7am to 6pm - Pager - [phone]  After 6pm call admission pager [phone]  [facility]  [phone]  CC: Primary care physician; Tamsen Roers, PA

## 2018-12-08 NOTE — Progress Notes (Signed)
Patient ID: Hailey [name], female   DOB: [birth year], 72 y.o.   MRN: [id number]  ACP note  Patient present  Diagnosis: COPD exacerbation, chronic respiratory failure, essential hypertension, hyperlipidemia, depression, osteoporosis  CODE STATUS discussed and patient wishes to be a full code  Plan.  COPD exacerbation treatment with Solu-Medrol budesonide and DuoNeb nebulizer solution.  Empiric Zithromax.  Oxygen supplementation for chronic respiratory failure  Time spent on ACP discussion 17 minutes Dr. Alford Highlandichard Airlie Blumenberg

## 2018-12-08 NOTE — ED Notes (Signed)
Report to Kim, RN

## 2018-12-08 NOTE — ED Triage Notes (Signed)
Patient arrived by EMS from home for low O2 levels. Reports weakness, sob, dizziness and low O2 readings at home in the 80s. Patient wears 2L O2 at home continuous. O2 saturation 85% during triage. Patient placed on 4L O2 with saturation reading of 92.

## 2018-12-08 NOTE — ED Notes (Signed)
Nebulizer treatments started. Patient tolerating well.

## 2018-12-08 NOTE — ED Notes (Signed)
Patient states has been using inhaler. Last smoked Sunday. Patient states SOB and wheezing started past few days and has been watching her O2 at home. Patient states she could not get her O2 above 90 at home.

## 2018-12-08 NOTE — ED Provider Notes (Signed)
[facility] Emergency Department Provider Note  Time seen: 5:01 PM  I have reviewed the triage vital signs and the nursing notes.   HISTORY  Chief Complaint Shortness of Breath and Weakness    HPI Hailey Gibbs is a 72 y.o. female with a past medical history of anemia, COPD on 2 L of oxygen 24/7, gastric reflux, hypertension, hyperlipidemia, presents to the emergency department for difficulty breathing.  According to the patient over the past several days she has had increased difficulty breathing, as well as increased cough and wheeze.  She has been using her rescue albuterol inhaler with little relief at home.  Patient states it became worse today so she came to the emergency department for evaluation.  Denies any known fever but does state chills at times.  States cough but denies significant sputum production.  No chest pain.   Past Medical History:  Diagnosis Date  . Abnormal chest sounds 08/29/2015  . Acute respiratory failure (HCC)   . Anemia   . Anxiety   . BP (high blood pressure) 08/29/2015  . Carotid artery occlusion   . Carotid stenosis 06/23/2015  . COPD (chronic obstructive pulmonary disease) (HCC)   . COPD exacerbation (HCC) 10/26/2016  . Coronary artery disease   . Depression   . Dysrhythmia   . GERD (gastroesophageal reflux disease)   . History of orthopnea   . HOH (hard of hearing)   . Hyperlipidemia   . Hypertension   . Obstructive chronic bronchitis with exacerbation (HCC) 08/29/2015  . Oxygen dependent    2L/MIN CONTINUOUS  . Palliative care by specialist   . Palpitations   . Seasonal allergies   . Shortness of breath dyspnea     Patient Active Problem List   Diagnosis Date Noted  . Acute respiratory failure (HCC)   . Palliative care by specialist   . UTI (urinary tract infection) 01/17/2018  . Sepsis (HCC) 11/07/2016  . COPD exacerbation (HCC) 10/26/2016  . Abnormal chest sounds 08/29/2015  . Obstructive chronic  bronchitis with exacerbation (HCC) 08/29/2015  . Anxiety and depression 08/29/2015  . BP (high blood pressure) 08/29/2015  . Carotid stenosis 06/23/2015  . DNR (do not resuscitate) discussion 05/30/2015  . Tobacco use 05/30/2015  . Hyperlipidemia     Past Surgical History:  Procedure Laterality Date  . CATARACT EXTRACTION W/PHACO Left 11/04/2018   Procedure: CATARACT EXTRACTION PHACO AND INTRAOCULAR LENS PLACEMENT (IOC);  Surgeon: Galen Manila, MD;  Location: [facility] ORS;  Service: Ophthalmology;  Laterality: Left;  [place] 00:49.1 CDE 6.32 Fluid Pack Lot # [id number]  . CATARACT EXTRACTION W/PHACO Right 11/25/2018   Procedure: CATARACT EXTRACTION PHACO AND INTRAOCULAR LENS PLACEMENT (IOC) RIGHT;  Surgeon: Galen Manila, MD;  Location: [facility] ORS;  Service: Ophthalmology;  Laterality: Right;  [place]   00:51 CDE  8.93 Fluid pack lot #[id number]  . ENDARTERECTOMY Left 06/23/2015   Procedure: ENDARTERECTOMY CAROTID;  Surgeon: Annice Needy, MD;  Location: [facility] ORS;  Service: Vascular;  Laterality: Left;  . TUBAL LIGATION    . VAGINAL HYSTERECTOMY      Prior to Admission medications   Medication Sig Start Date End Date Taking? Authorizing Provider  acetaminophen (TYLENOL) 500 MG tablet Take 1,000 mg by mouth every 6 (six) hours as needed for mild pain or headache.     [provider]  albuterol (VENTOLIN HFA) 108 (90 Base) MCG/ACT inhaler INHALE 1-2 PUFFS 4 TIMES A DAY AS NEEDED FOR WHEEZING Patient taking differently: Inhale 2 puffs  into the lungs every 6 (six) hours as needed for wheezing or shortness of breath.  07/22/18   Chrismon, Jodell Ciproennis E, PA  alendronate (FOSAMAX) 70 MG tablet Take 1 tablet (70 mg total) by mouth every 7 (seven) days. Take with a full glass of water on an empty stomach. Patient taking differently: Take 70 mg by mouth every Monday. Take with a full glass of water on an empty stomach. 09/01/18   Chrismon, Jodell Ciproennis E, PA  amLODipine (NORVASC) 10 MG tablet TAKE 1 TABLET BY  MOUTH EVERY DAY Patient taking differently: Take 10 mg by mouth every evening.  06/02/18   Chrismon, Jodell Ciproennis E, PA  aspirin 81 MG chewable tablet Chew 81 mg by mouth at bedtime.     [provider]  atorvastatin (LIPITOR) 10 MG tablet TAKE 1 TABLET BY MOUTH EVERY DAY Patient taking differently: Take 10 mg by mouth at bedtime.  06/02/18   Chrismon, Jodell Ciproennis E, PA  bisacodyl (BISACODYL) 5 MG EC tablet Take 5 mg by mouth daily as needed for moderate constipation.    [provider]  calcium carbonate (OSCAL) 1500 (600 Ca) MG TABS tablet Take 600 mg of elemental calcium by mouth 2 (two) times daily.     [provider]  cefdinir (OMNICEF) 300 MG capsule Take 1 capsule (300 mg total) by mouth 2 (two) times daily. 11/13/18   Chrismon, Jodell Ciproennis E, PA  cetirizine (ZYRTEC) 10 MG tablet Take 10 mg by mouth at bedtime.     [provider]  Cholecalciferol (VITAMIN D3) 2000 units TABS Take 2,000 Units by mouth at bedtime.     [provider]  ferrous sulfate 325 (65 FE) MG tablet Take 325 mg by mouth at bedtime.     [provider]  HYDROcodone-acetaminophen (NORCO/VICODIN) 5-325 MG tablet Take 1 tablet by mouth 2 (two) times daily as needed for moderate pain.    [provider]  Multiple Vitamin (MULTIVITAMIN) tablet Take 1 tablet by mouth at bedtime.     [provider]  potassium chloride SA (K-DUR,KLOR-CON) 20 MEQ tablet Take 1 tablet (20 mEq total) by mouth daily. Patient not taking: Reported on 11/11/2018 02/18/18   Chrismon, Jodell Ciproennis E, PA  predniSONE (DELTASONE) 5 MG tablet Take 1 tablet (5 mg total) by mouth daily with breakfast. Taper down by one tablet daily for 6 days (6,5,4,3,2,1) Patient not taking: Reported on 11/20/2018 11/11/18   Chrismon, Jodell Ciproennis E, PA  sertraline (ZOLOFT) 100 MG tablet 1 (ONE) TABLET, ORAL, AT BEDTIME DAILY BY MOUTH Patient taking differently: Take 100 mg by mouth at bedtime.  07/06/18   Chrismon, Jodell Ciproennis E, PA  sodium  chloride (OCEAN) 0.65 % SOLN nasal spray Place 1 spray into both nostrils as needed (dryness).    [provider]  SPIRIVA HANDIHALER 18 MCG inhalation capsule INHALE 1 CAPSULE ONCE A DAY Patient taking differently: Place 18 mcg into inhaler and inhale daily.  02/10/18   Chrismon, Jodell Ciproennis E, PA  SUPER B COMPLEX/C PO Take 1 tablet by mouth at bedtime.    [provider]  SYMBICORT 160-4.5 MCG/ACT inhaler INHALE 2 PUFFS INTO THE LUNGS 2 (TWO) TIMES DAILY. 11/10/18   Chrismon, Jodell Ciproennis E, PA  vitamin E 400 UNIT capsule Take 400 Units by mouth at bedtime.     [provider]    Allergies  Allergen Reactions  . Codeine Anxiety         Family History  Problem Relation Age of Onset  . Heart disease  Father 36       CABG   . Hyperlipidemia Father   . Pneumonia Father   . Dementia Mother   . COPD Mother   . Diabetes Sister   . Parkinson's disease Brother   . Post-traumatic stress disorder Son     Social History Social History   Tobacco Use  . Smoking status: Current Every Day Smoker    Packs/day: 0.50    Years: 45.00    Pack years: 22.50    Types: Cigarettes  . Smokeless tobacco: Never Used  . Tobacco comment: give our take 0-1 pack a day / uses patch PRN  Substance Use Topics  . Alcohol use: No    Alcohol/week: 0.0 standard drinks  . Drug use: Yes    Comment: prescribed    Review of Systems Constitutional: Negative for fever. Cardiovascular: Negative for chest pain. Respiratory: Positive for shortness of breath worsening over the past 3 to 4 days positive for cough. Gastrointestinal: Negative for abdominal pain, vomiting Genitourinary: Negative for urinary compaints Musculoskeletal: Negative for leg pain or swelling. Skin: Negative for skin complaints  Neurological: Negative for headache All other ROS negative  ____________________________________________   PHYSICAL EXAM:  VITAL SIGNS: ED Triage Vitals  Enc Vitals Group     BP 12/08/18 1307  138/61     Pulse Rate 12/08/18 1307 98     Resp 12/08/18 1307 (!) 22     Temp 12/08/18 1307 98.3 F (36.8 C)     Temp Source 12/08/18 1307 Oral     SpO2 12/08/18 1307 (!) 85 %     Weight 12/08/18 1308 126 lb (57.2 kg)     Height 12/08/18 1308 5\' 5"  (1.651 m)     Head Circumference --      Peak Flow --      Pain Score 12/08/18 1308 0     Pain Loc --      Pain Edu? --      Excl. in GC? --    Constitutional: Alert and oriented. Well appearing and in no distress. Eyes: Normal exam ENT   Head: Normocephalic and atraumatic.   Mouth/Throat: Mucous membranes are moist. Cardiovascular: Normal rate, regular rhythm. No murmur Respiratory: Patient has mild tachypnea with very diminished breath sounds bilaterally slight expiratory wheeze. Gastrointestinal: Soft and nontender. No distention Musculoskeletal: Nontender with normal range of motion in all extremities. No lower extremity tenderness or edema. Neurologic:  Normal speech and language. No gross focal neurologic deficits  Skin:  Skin is warm, dry and intact.  Psychiatric: Mood and affect are normal.   ____________________________________________    EKG  EKG viewed and interpreted by myself shows a normal sinus rhythm at 95 bpm with a narrow QRS, normal axis, largely normal intervals with nonspecific ST changes.  ____________________________________________    RADIOLOGY  Chest x-ray shows bibasilar atelectasis.  ____________________________________________   INITIAL IMPRESSION / ASSESSMENT AND PLAN / ED COURSE  Pertinent labs & imaging results that were available during my care of the patient were reviewed by me and considered in my medical decision making (see chart for details).  Patient presents to the emergency department for worsening shortness of breath over the past 3 to 4 days along with cough.  Patient has COPD, exam is very consistent with COPD exacerbation.  Differential this time would also include CHF,  pneumonia, URI, bronchitis.  Patient's x-ray is negative for pneumonia.  Troponin is negative.  EKG shows nonspecific findings.  Patient given DuoNeb's, Solu-Medrol.  Continues to be somewhat short of breath.  Satting 84% on her normal 2 L.  Satting 90 to 92% on 4 L currently.  Given the patient's increased oxygen requirement with subjective difficulty breathing especially with exertion I believe the patient requires admission to the hospital service for ongoing treatment of her COPD exacerbation.  Patient agreeable to plan of care.  ____________________________________________   FINAL CLINICAL IMPRESSION(S) / ED DIAGNOSES  COPD exacerbation   Minna AntisPaduchowski, Millard Bautch, MD 12/08/18 1704

## 2018-12-09 LAB — CBC
HEMATOCRIT: 36.1 % (ref 36.0–46.0)
Hemoglobin: 11.2 g/dL — ABNORMAL LOW (ref 12.0–15.0)
MCH: 31 pg (ref 26.0–34.0)
MCHC: 31 g/dL (ref 30.0–36.0)
MCV: 100 fL (ref 80.0–100.0)
Platelets: 355 10*3/uL (ref 150–400)
RBC: 3.61 MIL/uL — AB (ref 3.87–5.11)
RDW: 11.8 % (ref 11.5–15.5)
WBC: 6.5 10*3/uL (ref 4.0–10.5)
nRBC: 0 % (ref 0.0–0.2)

## 2018-12-09 LAB — BASIC METABOLIC PANEL
Anion gap: 7 (ref 5–15)
BUN: 13 mg/dL (ref 8–23)
CHLORIDE: 90 mmol/L — AB (ref 98–111)
CO2: 41 mmol/L — ABNORMAL HIGH (ref 22–32)
Calcium: 9.3 mg/dL (ref 8.9–10.3)
Creatinine, Ser: 0.5 mg/dL (ref 0.44–1.00)
GFR calc Af Amer: >60 mL/min (ref >60)
GFR calc non Af Amer: >60 mL/min (ref >60)
Glucose, Bld: 135 mg/dL — ABNORMAL HIGH (ref 70–99)
Potassium: 4.3 mmol/L (ref 3.5–5.1)
Sodium: 138 mmol/L (ref 135–145)

## 2018-12-09 MED ORDER — INFLUENZA VAC SPLIT HIGH-DOSE 0.5 ML IM SUSY
0.5000 mL | PREFILLED_SYRINGE | INTRAMUSCULAR | Status: AC
  Administered 2018-12-10: 0.5 mL via INTRAMUSCULAR
  Filled 2018-12-09: qty 0.5

## 2018-12-09 NOTE — Progress Notes (Signed)
[facility] [place] [facility]      PATIENT NAME: Hailey Gibbs    MR#:  [id number]  DATE OF BIRTH:  Jun 08, 1946  SUBJECTIVE:   Patient admitted to the hospital secondary to shortness of breath and hypoxemia noted to be in COPD exacerbation.  Still having significant hypoxemia on minimal exertion.  Denies any chest pains, hemoptysis, nausea vomiting or any other associated symptoms.  REVIEW OF SYSTEMS:    Review of Systems  Constitutional: Negative for chills and fever.  HENT: Negative for congestion and tinnitus.   Eyes: Negative for blurred vision and double vision.  Respiratory: Positive for shortness of breath. Negative for cough and wheezing.   Cardiovascular: Negative for chest pain, orthopnea and PND.  Gastrointestinal: Negative for abdominal pain, diarrhea, nausea and vomiting.  Genitourinary: Negative for dysuria and hematuria.  Neurological: Negative for dizziness, sensory change and focal weakness.  All other systems reviewed and are negative.   Nutrition: Regular Tolerating Diet: Yes Tolerating PT: Await Eval.   DRUG ALLERGIES:   Allergies  Allergen Reactions  . Codeine Anxiety         VITALS:  Blood pressure 127/62, pulse (!) 102, temperature 98 F (36.7 C), temperature source Oral, resp. rate 17, height 5\' 5"  (1.651 m), weight 57.2 kg, SpO2 94 %.  PHYSICAL EXAMINATION:   Physical Exam  GENERAL:  72 y.o.-year-old thin patient lying in bed in no acute distress.  EYES: Pupils equal, round, reactive to light and accommodation. No scleral icterus. Extraocular muscles intact.  HEENT: Head atraumatic, normocephalic. Oropharynx and nasopharynx clear.  NECK:  Supple, no jugular venous distention. No thyroid enlargement, no tenderness.  LUNGS: Prolonged Insp. & Exp. phase.  End expiratory wheezing bilaterally, no rhonchi, rales.  Negative use of accessory muscles. CARDIOVASCULAR: S1, S2 normal. No murmurs, rubs, or gallops.  ABDOMEN:  Soft, nontender, nondistended. Bowel sounds present. No organomegaly or mass.  EXTREMITIES: No cyanosis, clubbing or edema b/l.    NEUROLOGIC: Cranial nerves II through XII are intact. No focal Motor or sensory deficits b/l.   PSYCHIATRIC: The patient is alert and oriented x 3.  SKIN: No obvious rash, lesion, or ulcer.    LABORATORY PANEL:   CBC Recent Labs  Lab 12/09/18 0441  WBC 6.5  HGB 11.2*  HCT 36.1  PLT 355   ------------------------------------------------------------------------------------------------------------------  Chemistries  Recent Labs  Lab 12/09/18 0441  NA 138  K 4.3  CL 90*  CO2 41*  GLUCOSE 135*  BUN 13  CREATININE 0.50  CALCIUM 9.3   ------------------------------------------------------------------------------------------------------------------  Cardiac Enzymes Recent Labs  Lab 12/08/18 2124  TROPONINI <0.03   ------------------------------------------------------------------------------------------------------------------  RADIOLOGY:  Dg Chest 2 View  Result Date: 12/08/2018 CLINICAL DATA:  Shortness of breath.  Chest tightness. EXAM: CHEST - 2 VIEW COMPARISON:  02/11/2018 01/14/2018.  11/07/2016. FINDINGS: Mediastinum and hilar structures normal. Heart size normal. Mild bibasilar subsegmental atelectasis and or scarring. Stable mild thoracic and lumbar compression fractures. IMPRESSION: Mild bibasilar subsegmental atelectasis and or scarring. Electronically Signed   By: Maisie Fushomas  Register   On: 12/08/2018 13:42     ASSESSMENT AND PLAN:   72 year old female with past medical history of COPD, chronic respiratory failure, hypertension, hyperlipidemia, osteoporosis, anxiety, GERD who presented to the hospital due to shortness of breath and noted to be in acute on chronic respiratory failure with hypoxia.  1.  Acute on chronic respiratory failure with hypoxia-secondary to COPD exacerbation. -Continue O2 supplementation.  Continue IV  steroids, scheduled duo nebs, Pulmicort  nebs and empiric antibiotics with Zithromax.  Patient is slow to improve.  2.  COPD exacerbation- as of patient's worsening respiratory failure with hypoxemia.  Patient's chest x-ray is negative for acute pneumonia.  This is likely secondary to ongoing tobacco abuse. -Continue IV steroids, scheduled duo nebs, Pulmicort nebs.  Continue empiric antibiotics with Zithromax.  Patient is already on oxygen at home.  3.  Osteoporosis-continue calcium supplements, vitamin D supplements.  4.  Hyperlipidemia-continue atorvastatin.  5.  Essential hypertension-continue Norvasc.  6.  Depression-continue Zoloft.   All the records are reviewed and case discussed with Care Management/Social Worker. Management plans discussed with the patient, family and they are in agreement.  CODE STATUS: Full code  DVT Prophylaxis: Lovenox  TOTAL TIME TAKING CARE OF THIS PATIENT: 30 minutes.   POSSIBLE D/C IN 1-2 DAYS, DEPENDING ON CLINICAL CONDITION.   [place] SirenSAINANI,Maurie Olesen J M.D on 12/09/2018 at 3:02 PM  Between 7am to 6pm - Pager - [phone]  After 6pm go to [email or web address] - Therapist, nutritionalpassword EPAS [facility]  [facility]  Office  [phone]  CC: Primary care physician; Chrismon, Jodell Ciproennis E, PA

## 2018-12-09 NOTE — Care Management Note (Signed)
Case Management Note  Patient Details  Name: Hailey Gibbs MRN: [id number] Date of Birth: 12-Sep-1946  Subjective/Objective:                 Consult prpesent for CM for home health needs.  Patient lives alone and is able to perform all of her adls.  She denies issues accessing medical care and is current with her pcp.  Drives herself to appointments.  Denies issues obtaining her medications. Chronic oxygen with [facility].  CM made many attempts to discuss possible home health services but patient wanted to discuss why she is on nebulizer and not her inhalers.  Found this has been discussed with attending and primary nurse and patient feels her inhalers help more than her neb treatments. Unable to get patient to focus on possible home health needs. If she is as independent as she says, she may not meet homebound criteria   Action/Plan:  Will need to make another attempt to determine if would be appropriate for any home health services  Expected Discharge Date:  12/13/18               Expected Discharge Plan:     In-House Referral:     Discharge planning Services     Post Acute Care Choice:    Choice offered to:     DME Arranged:    DME Agency:     HH Arranged:    HH Agency:     Status of Service:     If discussed at [employer] Meetings, dates discussed:    Additional Comments:  Hailey Gibbs, Hailey Matusek R, RN 12/09/2018, 3:35 PM

## 2018-12-10 DIAGNOSIS — Z23 Encounter for immunization: Secondary | ICD-10-CM | POA: Diagnosis not present

## 2018-12-10 MED ORDER — PREDNISONE 10 MG PO TABS: ORAL_TABLET | ORAL | 0 refills | Status: DC

## 2018-12-10 MED ORDER — SODIUM CHLORIDE 0.9% FLUSH
3.0000 mL | Freq: Two times a day (BID) | INTRAVENOUS | Status: DC
  Administered 2018-12-10: 3 mL via INTRAVENOUS

## 2018-12-10 MED ORDER — AZITHROMYCIN 250 MG PO TABS: 250.0000 mg | ORAL_TABLET | Freq: Every day | ORAL | 0 refills | Status: AC

## 2018-12-10 MED ORDER — IPRATROPIUM-ALBUTEROL 0.5-2.5 (3) MG/3ML IN SOLN: 3.0000 mL | Freq: Four times a day (QID) | RESPIRATORY_TRACT | 0 refills | Status: AC | PRN

## 2018-12-10 NOTE — Discharge Instructions (Signed)
COPD and Physical Activity  Chronic obstructive pulmonary disease (COPD) is a long-term (chronic) condition that affects the lungs. COPD is a general term that can be used to describe many different lung problems that cause lung swelling (inflammation) and limit airflow, including chronic bronchitis and emphysema.  The main symptom of COPD is shortness of breath, which makes it harder to do even simple tasks. This can also make it harder to exercise and be active. Talk with your health care provider about treatments to help you breathe better and actions you can take to prevent breathing problems during physical activity.  What are the benefits of exercising with COPD?  Exercising regularly is an important part of a healthy lifestyle. You can still exercise and do physical activities even though you have COPD. Exercise and physical activity improve your shortness of breath by increasing blood flow (circulation). This causes your heart to pump more oxygen through your body. Moderate exercise can improve your:  · Oxygen use.  · Energy level.  · Shortness of breath.  · Strength in your breathing muscles.  · Heart health.  · Sleep.  · Self-esteem and feelings of self-worth.  · Depression, stress, and anxiety levels.  Exercise can benefit everyone with COPD. The severity of your disease may affect how hard you can exercise, especially at first, but everyone can benefit. Talk with your health care provider about how much exercise is safe for you, and which activities and exercises are safe for you.  What actions can I take to prevent breathing problems during physical activity?  · Sign up for a pulmonary rehabilitation program. This type of program may include:  ? Education about lung diseases.  ? Exercise classes that teach you how to exercise and be more active while improving your breathing. This usually involves:  § Exercise using your lower extremities, such as a stationary bicycle.  § About 30 minutes of exercise, 2  to 5 times per week, for 6 to 12 weeks  § Strength training, such as push ups or leg lifts.  ? Nutrition education.  ? Group classes in which you can talk with others who also have COPD and learn ways to manage stress.  · If you use an oxygen tank, you should use it while you exercise. Work with your health care provider to adjust your oxygen for your physical activity. Your resting flow rate is different from your flow rate during physical activity.  · While you are exercising:  ? Take slow breaths.  ? Pace yourself and do not try to go too fast.  ? Purse your lips while breathing out. Pursing your lips is similar to a kissing or whistling position.  ? If doing exercise that uses a quick burst of effort, such as weight lifting:  § Breathe in before starting the exercise.  § Breathe out during the hardest part of the exercise (such as raising the weights).  Where to find support  You can find support for exercising with COPD from:  · Your health care provider.  · A pulmonary rehabilitation program.  · Your local health department or community health programs.  · Support groups, online or in-person. Your health care provider may be able to recommend support groups.  Where to find more information  You can find more information about exercising with COPD from:  · American Lung Association: lung.org.  · COPD Foundation: copdfoundation.org.  Contact a health care provider if:  · Your symptoms get worse.  · You   have chest pain.  · You have nausea.  · You have a fever.  · You have trouble talking or catching your breath.  · You want to start a new exercise program or a new activity.  Summary  · COPD is a general term that can be used to describe many different lung problems that cause lung swelling (inflammation) and limit airflow. This includes chronic bronchitis and emphysema.  · Exercise and physical activity improve your shortness of breath by increasing blood flow (circulation). This causes your heart to provide more  oxygen to your body.  · Contact your health care provider before starting any exercise program or new activity. Ask your health care provider what exercises and activities are safe for you.  This information is not intended to replace advice given to you by your health care provider. Make sure you discuss any questions you have with your health care provider.  Document Released: 12/19/2017 Document Revised: 12/19/2017 Document Reviewed: 12/19/2017  Elsevier Interactive Patient Education © 2019 [employer]

## 2018-12-10 NOTE — Discharge Summary (Signed)
[facility] -  at [facility]   PATIENT NAME: Hailey Gibbs    MR#:  [id number]  DATE OF BIRTH:  [birth year]  DATE OF ADMISSION:  12/08/2018 ADMITTING PHYSICIAN: Alford Highland, MD  DATE OF DISCHARGE: 12/10/2018  1:36 PM  PRIMARY CARE PHYSICIAN: Chrismon, Jodell Cipro, PA    ADMISSION DIAGNOSIS:  COPD exacerbation (HCC) [J44.1]  DISCHARGE DIAGNOSIS:  Active Problems:   COPD exacerbation (HCC)   SECONDARY DIAGNOSIS:   Past Medical History:  Diagnosis Date  . Abnormal chest sounds 08/29/2015  . Acute respiratory failure (HCC)   . Anemia   . Anxiety   . BP (high blood pressure) 08/29/2015  . Carotid artery occlusion   . Carotid stenosis 06/23/2015  . COPD (chronic obstructive pulmonary disease) (HCC)   . COPD exacerbation (HCC) 10/26/2016  . Coronary artery disease   . Depression   . Dysrhythmia   . GERD (gastroesophageal reflux disease)   . History of orthopnea   . HOH (hard of hearing)   . Hyperlipidemia   . Hypertension   . Obstructive chronic bronchitis with exacerbation (HCC) 08/29/2015  . Osteoporosis   . Oxygen dependent    2L/MIN CONTINUOUS  . Palliative care by specialist   . Palpitations   . Seasonal allergies   . Shortness of breath dyspnea     HOSPITAL COURSE:   73 year old female with past medical history of COPD, chronic respiratory failure, hypertension, hyperlipidemia, osteoporosis, anxiety, GERD who presented to the hospital due to shortness of breath and noted to be in acute on chronic respiratory failure with hypoxia.  1.  Acute on chronic respiratory failure with hypoxia-secondary to COPD exacerbation. -Patient was treated with IV steroids, scheduled duo nebs, Pulmicort nebs and empiric Zithromax.  Patient has improved.  She is no longer wheezing or bronchospastic and feels better and is being discharged home. She will continue her oxygen at home.  She was discharged on oral prednisone taper, empiric Zithromax for a few more  days.  2.  COPD exacerbation- cause of patient's worsening respiratory failure with hypoxemia.  Patient's chest x-ray was negative for acute pneumonia.  This was likely secondary to ongoing tobacco abuse with acute bronchitis. -Patient was treated with IV steroids, scheduled duo nebs, Pulmicort nebs and has improved.  She was also given empiric Zithromax.  She is being discharged now on a prednisone taper and empiric antibiotics.  She was also given a nebulizer machine with treatments as needed and set up for home health nursing services.  3.  Osteoporosis- she will continue calcium supplements, vitamin D supplements.  4.  Hyperlipidemia- she will continue atorvastatin.  5.  Essential hypertension-she will continue Norvasc.  6.  Depression-she will continue Zoloft.  DISCHARGE CONDITIONS:   Stable.   CONSULTS OBTAINED:    DRUG ALLERGIES:   Allergies  Allergen Reactions  . Codeine Anxiety         DISCHARGE MEDICATIONS:   Allergies as of 12/10/2018      Reactions   Codeine Anxiety          Medication List    TAKE these medications   acetaminophen 500 MG tablet Commonly known as:  TYLENOL Take 1,000 mg by mouth every 6 (six) hours as needed for mild pain or headache.   albuterol 108 (90 Base) MCG/ACT inhaler Commonly known as:  VENTOLIN HFA INHALE 1-2 PUFFS 4 TIMES A DAY AS NEEDED FOR WHEEZING What changed:    how much to take  how to take this  when to take this  reasons to take this  additional instructions   alendronate 70 MG tablet Commonly known as:  FOSAMAX Take 1 tablet (70 mg total) by mouth every 7 (seven) days. Take with a full glass of water on an empty stomach. What changed:  when to take this   amLODipine 10 MG tablet Commonly known as:  NORVASC TAKE 1 TABLET BY MOUTH EVERY DAY What changed:  when to take this   aspirin 81 MG chewable tablet Chew 81 mg by mouth at bedtime.   atorvastatin 10 MG tablet Commonly known as:  LIPITOR TAKE  1 TABLET BY MOUTH EVERY DAY What changed:  when to take this   azithromycin 250 MG tablet Commonly known as:  ZITHROMAX Take 1 tablet (250 mg total) by mouth daily for 3 days. Start taking on:  December 11, 2018   bisacodyl 5 MG EC tablet Generic drug:  bisacodyl Take 5 mg by mouth daily as needed for moderate constipation.   calcium carbonate 1500 (600 Ca) MG Tabs tablet Commonly known as:  OSCAL Take 600 mg of elemental calcium by mouth 2 (two) times daily.   cetirizine 10 MG tablet Commonly known as:  ZYRTEC Take 10 mg by mouth at bedtime.   ferrous sulfate 325 (65 FE) MG tablet Take 325 mg by mouth at bedtime.   HYDROcodone-acetaminophen 5-325 MG tablet Commonly known as:  NORCO/VICODIN Take 1 tablet by mouth 2 (two) times daily as needed for moderate pain.   ipratropium-albuterol 0.5-2.5 (3) MG/3ML Soln Commonly known as:  DUONEB Take 3 mLs by nebulization every 6 (six) hours as needed.   multivitamin tablet Take 1 tablet by mouth at bedtime.   predniSONE 10 MG tablet Commonly known as:  DELTASONE Label  & dispense according to the schedule below. 5 Pills PO for 1 day then, 4 Pills PO for 1 day, 3 Pills PO for 1 day, 2 Pills PO for 1 day, 1 Pill PO for 1 days then STOP.   sertraline 100 MG tablet Commonly known as:  ZOLOFT 1 (ONE) TABLET, ORAL, AT BEDTIME DAILY BY MOUTH What changed:  See the new instructions.   sodium chloride 0.65 % Soln nasal spray Commonly known as:  OCEAN Place 1 spray into both nostrils as needed (dryness).   SPIRIVA HANDIHALER 18 MCG inhalation capsule Generic drug:  tiotropium INHALE 1 CAPSULE ONCE A DAY What changed:  See the new instructions.   SUPER B COMPLEX/C PO Take 1 tablet by mouth at bedtime.   SYMBICORT 160-4.5 MCG/ACT inhaler Generic drug:  budesonide-formoterol INHALE 2 PUFFS INTO THE LUNGS 2 (TWO) TIMES DAILY.   Vitamin D3 50 MCG (2000 UT) Tabs Take 2,000 Units by mouth at bedtime.   vitamin E 400 UNIT  capsule Take 400 Units by mouth at bedtime.            Durable Medical Equipment  (From admission, onward)         Start     Ordered   12/10/18 1217  For home use only DME Nebulizer machine  Once    Question:  Patient needs a nebulizer to treat with the following condition  Answer:  COPD (chronic obstructive pulmonary disease) (HCC)   12/10/18 1216   12/10/18 0000  DME Nebulizer machine    Question:  Patient needs a nebulizer to treat with the following condition  Answer:  COPD (chronic obstructive pulmonary disease) (HCC)   12/10/18 1152  DISCHARGE INSTRUCTIONS:   DIET:  Cardiac diet  DISCHARGE CONDITION:  Stable  ACTIVITY:  Activity as tolerated  OXYGEN:  Home Oxygen: Yes.     Oxygen Delivery: 3 liters/min via Patient connected to nasal cannula oxygen  DISCHARGE LOCATION:  Home with Home Health RN, Aide.    If you experience worsening of your admission symptoms, develop shortness of breath, life threatening emergency, suicidal or homicidal thoughts you must seek medical attention immediately by calling 911 or calling your MD immediately  if symptoms less severe.  You Must read complete instructions/literature along with all the possible adverse reactions/side effects for all the Medicines you take and that have been prescribed to you. Take any new Medicines after you have completely understood and accpet all the possible adverse reactions/side effects.   Please note  You were cared for by a hospitalist during your hospital stay. If you have any questions about your discharge medications or the care you received while you were in the hospital after you are discharged, you can call the unit and asked to speak with the hospitalist on call if the hospitalist that took care of you is not available. Once you are discharged, your primary care physician will handle any further medical issues. Please note that NO REFILLS for any discharge medications will be  authorized once you are discharged, as it is imperative that you return to your primary care physician (or establish a relationship with a primary care physician if you do not have one) for your aftercare needs so that they can reassess your need for medications and monitor your lab values.     Today   Shortness of breath improved.  No wheezing, bronchospasm.    VITAL SIGNS:  Blood pressure (!) 143/58, pulse (!) 103, temperature 98.4 F (36.9 C), temperature source Oral, resp. rate 20, height 5\' 5"  (1.651 m), weight 57.2 kg, SpO2 92 %.  I/O:  No intake or output data in the 24 hours ending 12/10/18 1628  PHYSICAL EXAMINATION:  GENERAL:  73 y.o.-year-old patient lying in the bed with no acute distress.  EYES: Pupils equal, round, reactive to light and accommodation. No scleral icterus. Extraocular muscles intact.  HEENT: Head atraumatic, normocephalic. Oropharynx and nasopharynx clear.  NECK:  Supple, no jugular venous distention. No thyroid enlargement, no tenderness.  LUNGS: Normal breath sounds bilaterally, no wheezing, rales,rhonchi. No use of accessory muscles of respiration.  CARDIOVASCULAR: S1, S2 normal. No murmurs, rubs, or gallops.  ABDOMEN: Soft, non-tender, non-distended. Bowel sounds present. No organomegaly or mass.  EXTREMITIES: No pedal edema, cyanosis, or clubbing.  NEUROLOGIC: Cranial nerves II through XII are intact. No focal motor or sensory defecits b/l.  PSYCHIATRIC: The patient is alert and oriented x 3.   SKIN: No obvious rash, lesion, or ulcer.   DATA REVIEW:   CBC Recent Labs  Lab 12/09/18 0441  WBC 6.5  HGB 11.2*  HCT 36.1  PLT 355    Chemistries  Recent Labs  Lab 12/09/18 0441  NA 138  K 4.3  CL 90*  CO2 41*  GLUCOSE 135*  BUN 13  CREATININE 0.50  CALCIUM 9.3    Cardiac Enzymes Recent Labs  Lab 12/08/18 2124  TROPONINI <0.03    Microbiology Results  Results for orders placed or performed during the hospital encounter of  02/09/18  Blood Culture (routine x 2)     Status: None   Collection Time: 02/09/18  6:11 PM  Result Value Ref Range Status   Specimen Description  BLOOD BLOOD LEFT FOREARM  Final   Special Requests   Final    BOTTLES DRAWN AEROBIC AND ANAEROBIC Blood Culture adequate volume   Culture   Final    NO GROWTH 5 DAYS Performed at [facility], [address] [place], [place] [address]    Report Status 02/14/2018 FINAL  Final  Blood Culture (routine x 2)     Status: None   Collection Time: 02/09/18  6:11 PM  Result Value Ref Range Status   Specimen Description BLOOD BLOOD RIGHT FOREARM  Final   Special Requests   Final    BOTTLES DRAWN AEROBIC AND ANAEROBIC Blood Culture adequate volume   Culture   Final    NO GROWTH 5 DAYS Performed at [facility], [address] [place], [place] [address]    Report Status 02/14/2018 FINAL  Final    RADIOLOGY:  No results found.    Management plans discussed with the patient, family and they are in agreement.  CODE STATUS:     Code Status Orders  (From admission, onward)         Start     Ordered   12/08/18 1730  Full code  Continuous     12/08/18 1730          TOTAL TIME TAKING CARE OF THIS PATIENT: 40 minutes.    [place] SirenSAINANI,Tatym Schermer J M.D on 12/10/2018 at 4:28 PM  Between 7am to 6pm - Pager - [phone]  After 6pm go to [email or web address] - Therapist, nutritionalpassword EPAS [email or web address]  [facility]  Office  [phone]  CC: Primary care physician; Chrismon, Jodell Ciproennis E, PA

## 2018-12-10 NOTE — Progress Notes (Signed)
Discharged home to self care w [facility] services. Pt transported in transport chair to private vehicle with 02 in place.

## 2018-12-10 NOTE — Progress Notes (Signed)
Pt states she now feels ready to go home. Has 02 at home with chronic 02 use @ 2-3l/[place]. Oral and written AVS with rx's given with nebulizer delivered. Has 02 tank from home to transport. [facility]. Pt ready for for discharge home to self[facility].

## 2018-12-10 NOTE — Care Management Note (Signed)
Case Management Note  Patient Details  Name: Hailey Gibbs MRN: [id number] Date of Birth: 1946-09-08  Subjective/Objective:  Patient is ready for discharge.  Patient will be discharged with home health.  Nebulizer also ordered and is being provided by [facility]- Jermaine with [facility] will bring nebulizer to the bedside for patient.  [facility] will provided home health RN also.  Patient has chronic O2 through [place].    Robbie Lis RN BSN [phone]                Action/Plan: Discharged home with home health and DME nebulizer.   Expected Discharge Date:  12/10/18               Expected Discharge Plan:  Home w Home Health Services  In-House Referral:     Discharge planning Services  CM Consult  Post Acute Care Choice:  Durable Medical Equipment, Home Health Choice offered to:  Patient  DME Arranged:  Nebulizer/meds DME Agency:  [facility]  HH Arranged:  RN [facility] Agency:  [facility]  Status of Service:  Completed, signed off  If discussed at Long Length of Stay Meetings, dates discussed:    Additional Comments:  Allayne Butcher, RN 12/10/2018, 12:40 PM

## 2018-12-11 ENCOUNTER — Telehealth: Payer: Self-pay

## 2018-12-11 NOTE — Telephone Encounter (Signed)
Transition Care Management Follow-up Telephone Call  Date of discharge and from where: 12/10/18 from [facility]  How have you been since you were released from the hospital? Doing good, declines SOB, fever or n/v/d.  Any questions or concerns? No   Items Reviewed:  Did the pt receive and understand the discharge instructions provided? Yes   Medications obtained and verified? Yes   Any new allergies since your discharge? No   Dietary orders reviewed? N/A  Do you have support at home? No, pt states she has HH coming to check on her tomorrow.  Other (ie: DME, Home Health, etc) [facility] is coming to home tomorrow.  Functional Questionnaire: (I = Independent and D = Dependent)  Bathing/Dressing- I   Meal Prep- I  Eating- I  Maintaining continence- I  Transferring/Ambulation- I  Managing Meds- I   Follow up appointments reviewed:    PCP Hospital f/u appt confirmed? Yes  Scheduled to see Dortha Kern on 12/16/17 @ 1:20 PM.  Specialist Hospital f/u appt confirmed? Yes   Are transportation arrangements needed? No   If their condition worsens, is the pt aware to call  their PCP or go to the ED? Yes  Was the patient provided with contact information for the PCP's office or ED? Yes  Was the pt encouraged to call back with questions or concerns? Yes

## 2018-12-12 ENCOUNTER — Telehealth: Payer: Self-pay | Admitting: Family Medicine

## 2018-12-12 NOTE — Telephone Encounter (Signed)
Please advise 

## 2018-12-12 NOTE — Telephone Encounter (Signed)
Keep follow up appointment on 12-16-17. Will decide if home care needed.

## 2018-12-12 NOTE — Telephone Encounter (Signed)
[name] with Homecare called to let us know that they would not be taking Hailey Gibbs as a client as she is not homebound.

## 2018-12-15 NOTE — Telephone Encounter (Signed)
Patient scheduled for appointment tomorrow.  

## 2018-12-15 NOTE — Progress Notes (Signed)
Patient: Hailey Gibbs Female    DOB: 02-Apr-1946   73 y.o.   MRN: [id number] Visit Date: 12/16/2018  Today's Provider: Dortha Kernennis Chrismon, PA   Chief Complaint  Patient presents with  . Hospitalization Follow-up   Subjective:     HPI   Follow up Hospitalization  Patient was admitted to [facility] on 12/08/2018 and discharged on 12/10/2018. She was treated for COPD Exacerbation. Treatment for this included IV steroids, scheduled duo nebs, Pulmicort nebs and empiric Zithromax.Discharged on oral prednisone taper, empiric Zithromax for a few more days. [facility] She reports excellent compliance with treatment. She reports this condition is Improved.She feels tired.  ------------------------------------------------------------------------------------ Past Medical History:  Diagnosis Date  . Abnormal chest sounds 08/29/2015  . Acute respiratory failure (HCC)   . Anemia   . Anxiety   . BP (high blood pressure) 08/29/2015  . Carotid artery occlusion   . Carotid stenosis 06/23/2015  . COPD (chronic obstructive pulmonary disease) (HCC)   . COPD exacerbation (HCC) 10/26/2016  . Coronary artery disease   . Depression   . Dysrhythmia   . GERD (gastroesophageal reflux disease)   . History of orthopnea   . HOH (hard of hearing)   . Hyperlipidemia   . Hypertension   . Obstructive chronic bronchitis with exacerbation (HCC) 08/29/2015  . Osteoporosis   . Oxygen dependent    2L/MIN CONTINUOUS  . Palliative care by specialist   . Palpitations   . Seasonal allergies   . Shortness of breath dyspnea    Past Surgical History:  Procedure Laterality Date  . CATARACT EXTRACTION W/PHACO Left 11/04/2018   Procedure: CATARACT EXTRACTION PHACO AND INTRAOCULAR LENS PLACEMENT (IOC);  Surgeon: Galen ManilaPorfilio, William, MD;  Location: [facility] ORS;  Service: Ophthalmology;  Laterality: Left;  US 00:49.1 CDE 6.32 Fluid Pack Lot # [id number]  . CATARACT  EXTRACTION W/PHACO Right 11/25/2018   Procedure: CATARACT EXTRACTION PHACO AND INTRAOCULAR LENS PLACEMENT (IOC) RIGHT;  Surgeon: Galen ManilaPorfilio, William, MD;  Location: [facility] ORS;  Service: Ophthalmology;  Laterality: Right;  US   00:51 CDE  8.93 Fluid pack lot #[id number]  . CATARACT EXTRACTION, BILATERAL Bilateral   . ENDARTERECTOMY Left 06/23/2015   Procedure: ENDARTERECTOMY CAROTID;  Surgeon: Annice NeedyJason S Dew, MD;  Location: [facility] ORS;  Service: Vascular;  Laterality: Left;  . TUBAL LIGATION    . VAGINAL HYSTERECTOMY     Family History  Problem Relation Age of Onset  . Heart disease Father [id number]       CABG   . Hyperlipidemia Father   . Pneumonia Father   . Dementia Mother   . COPD Mother   . Diabetes Sister   . Parkinson's disease Brother   . Post-traumatic stress disorder Son    Allergies  Allergen Reactions  . Codeine Anxiety         Current Outpatient Medications:  .  acetaminophen (TYLENOL) 500 MG tablet, Take 1,000 mg by mouth every 6 (six) hours as needed for mild pain or headache. , Disp: , Rfl:  .  albuterol (VENTOLIN HFA) 108 (90 Base) MCG/ACT inhaler, INHALE 1-2 PUFFS 4 TIMES A DAY AS NEEDED FOR WHEEZING (Patient taking differently: Inhale 2 puffs into the lungs every 6 (six) hours as needed for wheezing or shortness of breath. ), Disp: 18 Inhaler, Rfl: 2 .  alendronate (FOSAMAX) 70 MG tablet, Take 1 tablet (70 mg total) by mouth every 7 (seven) days. Take with a full  glass of water on an empty stomach. (Patient taking differently: Take 70 mg by mouth every Monday. Take with a full glass of water on an empty stomach.), Disp: 12 tablet, Rfl: 4 .  amLODipine (NORVASC) 10 MG tablet, TAKE 1 TABLET BY MOUTH EVERY DAY (Patient taking differently: Take 10 mg by mouth every evening. ), Disp: 90 tablet, Rfl: 3 .  aspirin 81 MG chewable tablet, Chew 81 mg by mouth at bedtime. , Disp: , Rfl:  .  atorvastatin (LIPITOR) 10 MG tablet, TAKE 1 TABLET BY MOUTH EVERY DAY (Patient taking differently: Take  10 mg by mouth at bedtime. ), Disp: 90 tablet, Rfl: 3 .  bisacodyl (BISACODYL) 5 MG EC tablet, Take 5 mg by mouth daily as needed for moderate constipation., Disp: , Rfl:  .  calcium carbonate (OSCAL) 1500 (600 Ca) MG TABS tablet, Take 600 mg of elemental calcium by mouth 2 (two) times daily. , Disp: , Rfl:  .  cetirizine (ZYRTEC) 10 MG tablet, Take 10 mg by mouth at bedtime. , Disp: , Rfl:  .  Cholecalciferol (VITAMIN D3) 2000 units TABS, Take 2,000 Units by mouth at bedtime. , Disp: , Rfl:  .  ferrous sulfate 325 (65 FE) MG tablet, Take 325 mg by mouth at bedtime. , Disp: , Rfl:  .  HYDROcodone-acetaminophen (NORCO/VICODIN) 5-325 MG tablet, Take 1 tablet by mouth 2 (two) times daily as needed for moderate pain., Disp: , Rfl:  .  ipratropium-albuterol (DUONEB) 0.5-2.5 (3) MG/3ML SOLN, Take 3 mLs by nebulization every 6 (six) hours as needed., Disp: 360 mL, Rfl: 0 .  Multiple Vitamin (MULTIVITAMIN) tablet, Take 1 tablet by mouth at bedtime. , Disp: , Rfl:  .  predniSONE (DELTASONE) 10 MG tablet, Label  & dispense according to the schedule below. 5 Pills PO for 1 day then, 4 Pills PO for 1 day, 3 Pills PO for 1 day, 2 Pills PO for 1 day, 1 Pill PO for 1 days then STOP., Disp: 15 tablet, Rfl: 0 .  sertraline (ZOLOFT) 100 MG tablet, 1 (ONE) TABLET, ORAL, AT BEDTIME DAILY BY MOUTH (Patient taking differently: Take 100 mg by mouth at bedtime. ), Disp: 90 tablet, Rfl: 3 .  sodium chloride (OCEAN) 0.65 % SOLN nasal spray, Place 1 spray into both nostrils as needed (dryness)., Disp: , Rfl:  .  SPIRIVA HANDIHALER 18 MCG inhalation capsule, INHALE 1 CAPSULE ONCE A DAY (Patient taking differently: Place 18 mcg into inhaler and inhale daily. ), Disp: 30 capsule, Rfl: 11 .  SUPER B COMPLEX/C PO, Take 1 tablet by mouth at bedtime., Disp: , Rfl:  .  SYMBICORT 160-4.5 MCG/ACT inhaler, INHALE 2 PUFFS INTO THE LUNGS 2 (TWO) TIMES DAILY., Disp: 10.2 Inhaler, Rfl: 3 .  vitamin E 400 UNIT capsule, Take 400 Units by mouth  at bedtime. , Disp: , Rfl:   Review of Systems  Respiratory: Positive for shortness of breath.   Cardiovascular: Negative for chest pain, palpitations and leg swelling.   Social History   Tobacco Use  . Smoking status: Former Smoker    Packs/day: 1.00    Years: 45.00    Pack years: 45.00    Types: Cigarettes    Last attempt to quit: 12/08/2018    Years since quitting: 0.0  . Smokeless tobacco: Never Used  Substance Use Topics  . Alcohol use: No    Alcohol/week: 0.0 standard drinks     Objective:   BP 128/70 (BP Location: Right Arm, Patient Position: Sitting, Cuff  Size: Normal)   Pulse (!) 102   Temp 98.1 F (36.7 C) (Oral)   Resp 16   Wt 126 lb 3.2 oz (57.2 kg)   SpO2 90% Comment: on 2 L oxygen  BMI 21.00 kg/m    Vitals:   12/16/18 1330  BP: 128/70  Pulse: (!) 102  Resp: 16  Temp: 98.1 F (36.7 C)  TempSrc: Oral  SpO2: 90%  Weight: 126 lb 3.2 oz (57.2 kg)   Physical Exam Constitutional:      General: She is not in acute distress.    Appearance: She is well-developed.  HENT:     Head: Normocephalic and atraumatic.     Right Ear: Hearing and tympanic membrane normal.     Left Ear: Hearing and tympanic membrane normal.     Nose: Nose normal.     Mouth/Throat:     Pharynx: Oropharynx is clear.  Eyes:     General: Lids are normal. No scleral icterus.       Right eye: No discharge.        Left eye: No discharge.     Conjunctiva/sclera: Conjunctivae normal.  Cardiovascular:     Rate and Rhythm: Normal rate and regular rhythm.     Heart sounds: Normal heart sounds.  Pulmonary:     Effort: No respiratory distress.     Comments: Some dyspnea and very distant breath sounds. No rales or wheezes today. Abdominal:     General: Bowel sounds are normal.     Palpations: Abdomen is soft.  Musculoskeletal: Normal range of motion.  Skin:    Findings: No lesion or rash.  Neurological:     Mental Status: She is alert and oriented to person, place, and time.    Psychiatric:        Speech: Speech normal.        Behavior: Behavior normal.        Thought Content: Thought content normal.       Assessment & Plan    1. COPD exacerbation (HCC) Hospitalized for acute exacerbation with acute on chronic respiratory failure with hypoxia. No fever or significant sputum production the past few days. Was not homebound enough to qualify for home care. Still using Duoneb prn with Spiriva 18 mcg inhalation qd, Symbicort 160-4.5 mcg/act 2 puffs BID and Albuterol-HFA prn rescue. Finished the Z-pak, prednisone and Albuterol by nebulizer. Feeling better as long as she continues the oxygen by nasal cannula at 3 LPM with pulse oximetry 90% today. Feels she breaths better using inhalers versus nebulizer. Refilled Ventolin-HFA. Recheck in 2 months. - albuterol (VENTOLIN HFA) 108 (90 Base) MCG/ACT inhaler; Inhale 2 puffs into the lungs every 6 (six) hours as needed for wheezing or shortness of breath.  Dispense: 18 g; Refill: 3  2. Sleep disturbance Not sleeping well and feeling irritable. Sleeping only 2-3 hours a night and napping throughout the day. Wants to sleep better at night (when she does her energy level is greatly improved). Will discontinue the Sertraline and switch to Trazadone 50 mg 1/2-1 tablet HS. Will call report of response to this for sleep disturbance. Has tried Tylenol-PM and Melatonin the past few days without help. - traZODone (DESYREL) 50 MG tablet; Take 0.5-1 tablets (25-50 mg total) by mouth at bedtime as needed for sleep.  Dispense: 30 tablet; Refill: 3     Dortha Kern, PA  [facility]

## 2018-12-16 ENCOUNTER — Encounter: Payer: Self-pay | Admitting: Family Medicine

## 2018-12-16 ENCOUNTER — Ambulatory Visit (INDEPENDENT_AMBULATORY_CARE_PROVIDER_SITE_OTHER): Payer: Medicare HMO | Admitting: Family Medicine

## 2018-12-16 VITALS — BP 128/70 | HR 102 | Temp 98.1°F | Resp 16 | Wt 126.2 lb

## 2018-12-16 DIAGNOSIS — G479 Sleep disorder, unspecified: Secondary | ICD-10-CM | POA: Diagnosis not present

## 2018-12-16 DIAGNOSIS — J441 Chronic obstructive pulmonary disease with (acute) exacerbation: Secondary | ICD-10-CM

## 2018-12-16 MED ORDER — ALBUTEROL SULFATE HFA 108 (90 BASE) MCG/ACT IN AERS: 2.0000 | INHALATION_SPRAY | Freq: Four times a day (QID) | RESPIRATORY_TRACT | 3 refills | Status: DC | PRN

## 2018-12-16 MED ORDER — TRAZODONE HCL 50 MG PO TABS: 25.0000 mg | ORAL_TABLET | Freq: Every evening | ORAL | 3 refills | Status: DC | PRN

## 2018-12-23 DIAGNOSIS — J449 Chronic obstructive pulmonary disease, unspecified: Secondary | ICD-10-CM | POA: Diagnosis not present

## 2018-12-31 DIAGNOSIS — J449 Chronic obstructive pulmonary disease, unspecified: Secondary | ICD-10-CM | POA: Diagnosis not present

## 2019-01-08 ENCOUNTER — Other Ambulatory Visit: Payer: Self-pay | Admitting: Family Medicine

## 2019-01-08 DIAGNOSIS — G479 Sleep disorder, unspecified: Secondary | ICD-10-CM

## 2019-01-10 DIAGNOSIS — J449 Chronic obstructive pulmonary disease, unspecified: Secondary | ICD-10-CM | POA: Diagnosis not present

## 2019-01-23 DIAGNOSIS — J449 Chronic obstructive pulmonary disease, unspecified: Secondary | ICD-10-CM | POA: Diagnosis not present

## 2019-01-31 DIAGNOSIS — J449 Chronic obstructive pulmonary disease, unspecified: Secondary | ICD-10-CM | POA: Diagnosis not present

## 2019-02-06 IMAGING — US US ABDOMEN LIMITED
1 series · 14 of 25 positions shown · non-contrast
Comparison: None

CLINICAL DATA: Right upper quadrant abdominal pain for 1 day.

EXAM:
ULTRASOUND ABDOMEN LIMITED RIGHT UPPER QUADRANT

[Series 1: us abdomen limited · 0.17mm/px · 14 of 78 slices shown]
[im 1/78]
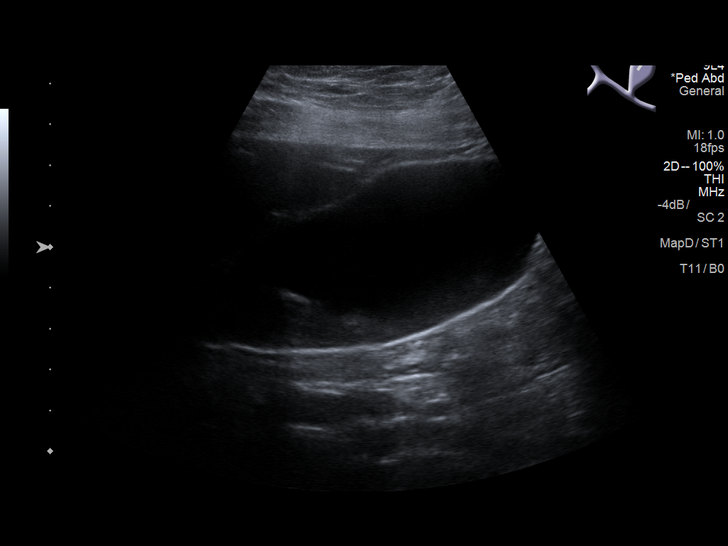
[im 7/78]
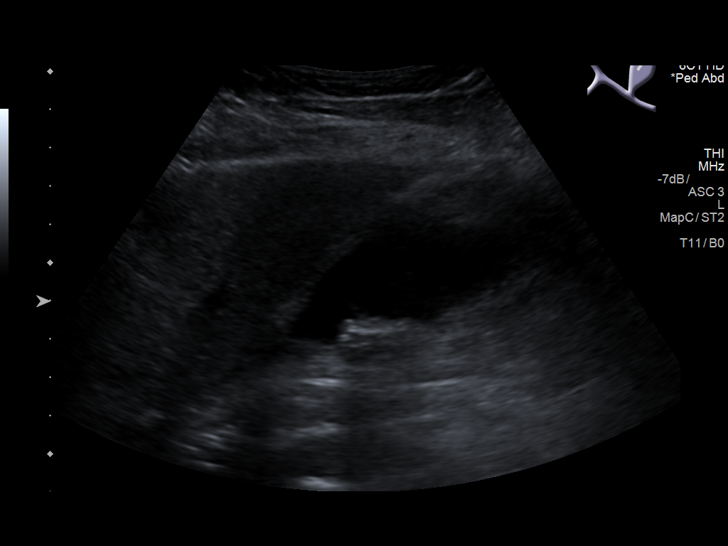
[im 13/78]
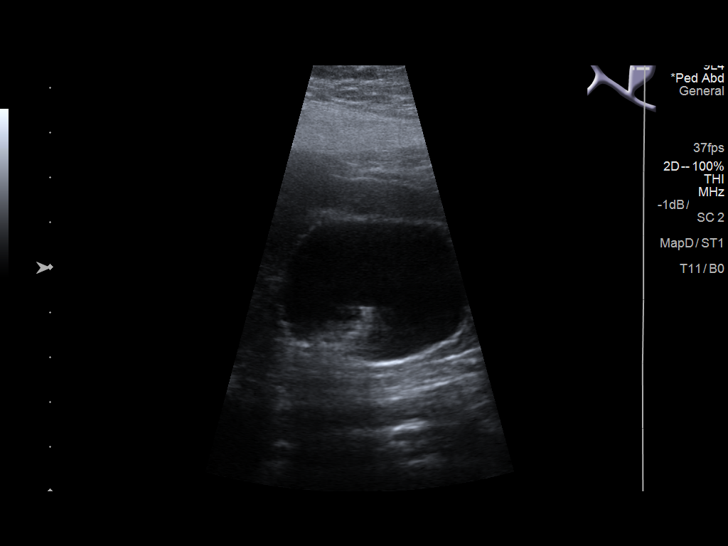
[im 20/78]
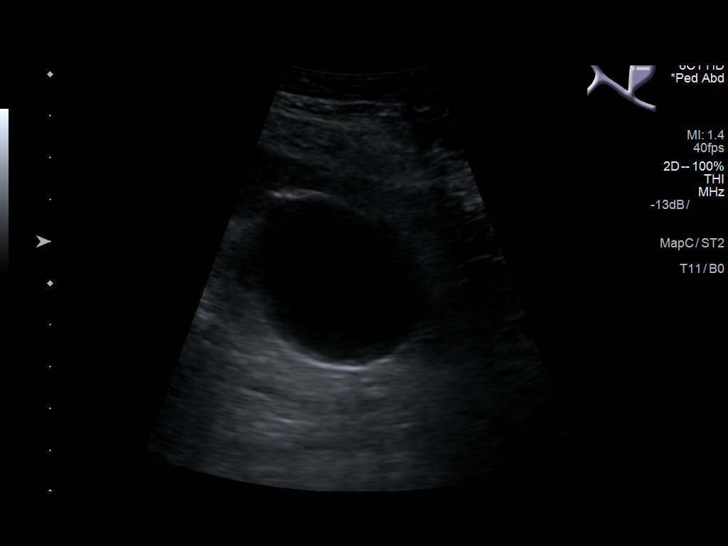
[im 26/78]
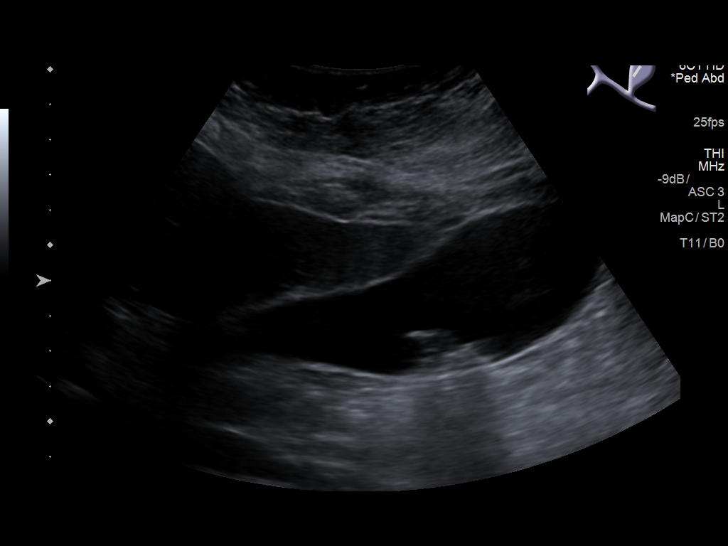
[im 29/78]
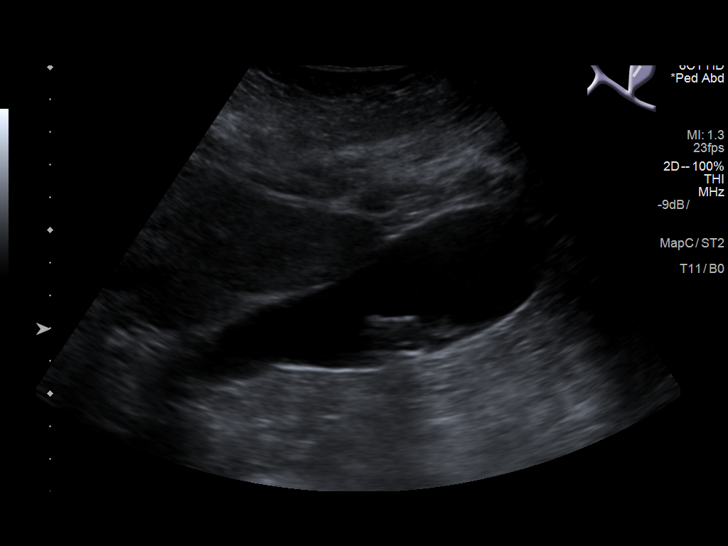
[im 36/78]
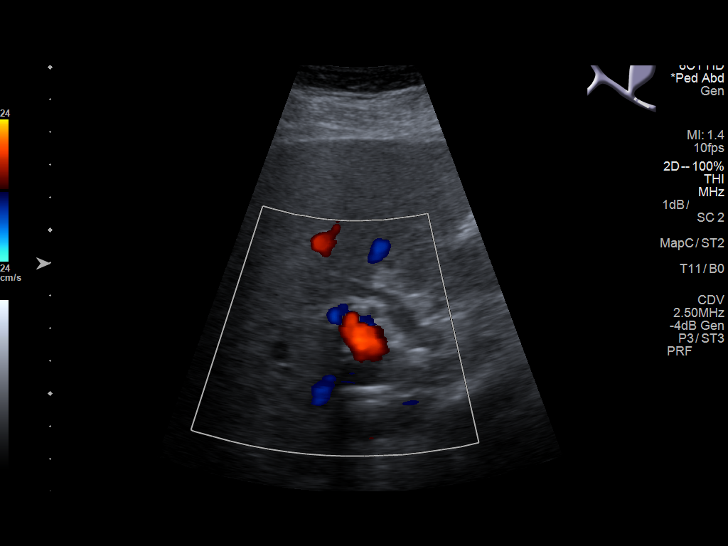
[im 42/78]
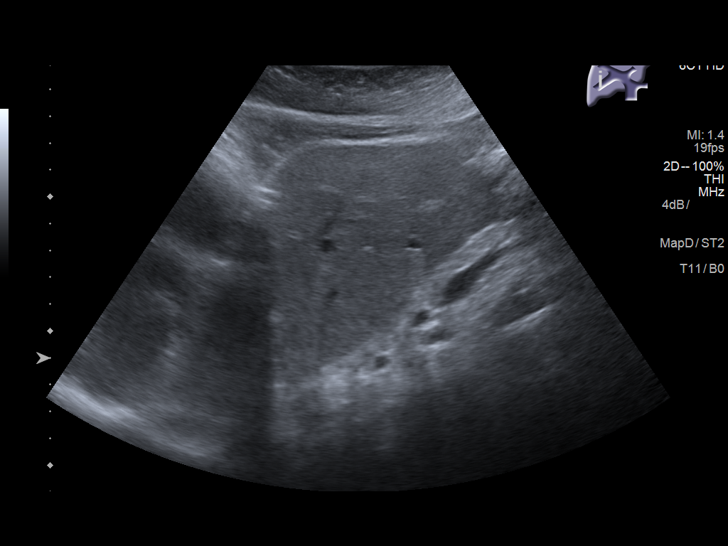
[im 49/78]
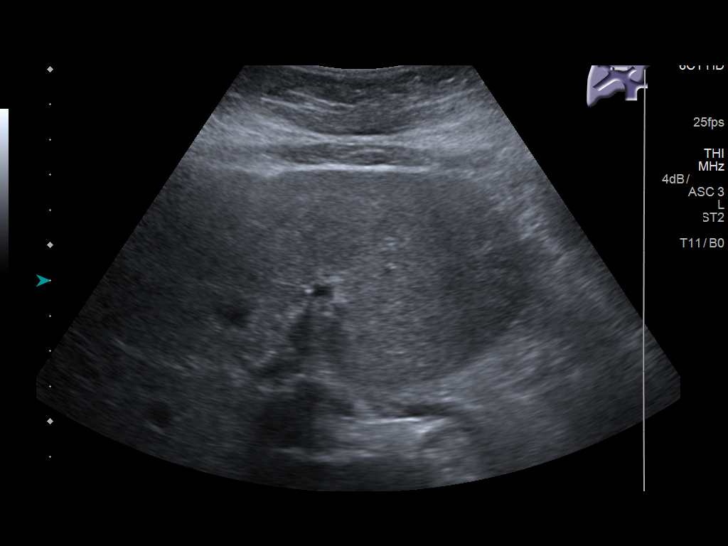
[im 52/78]
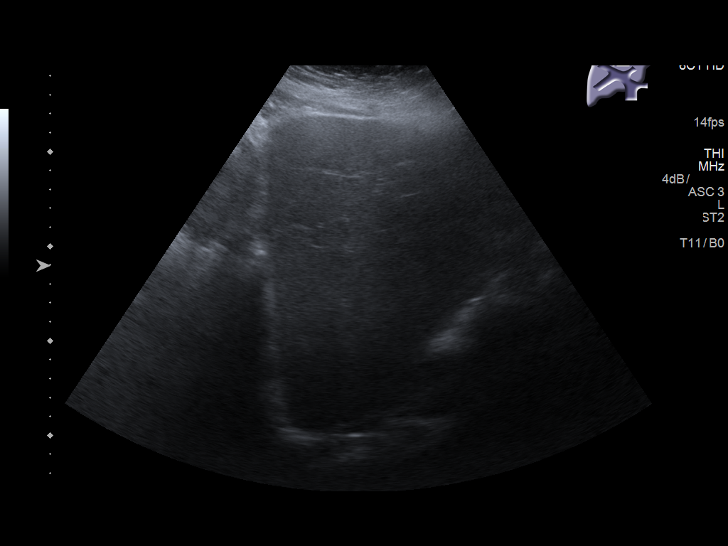
[im 58/78]
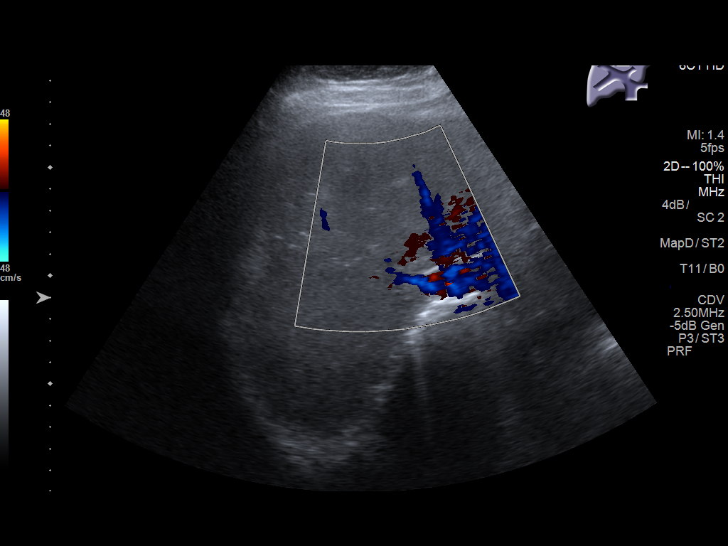
[im 65/78]
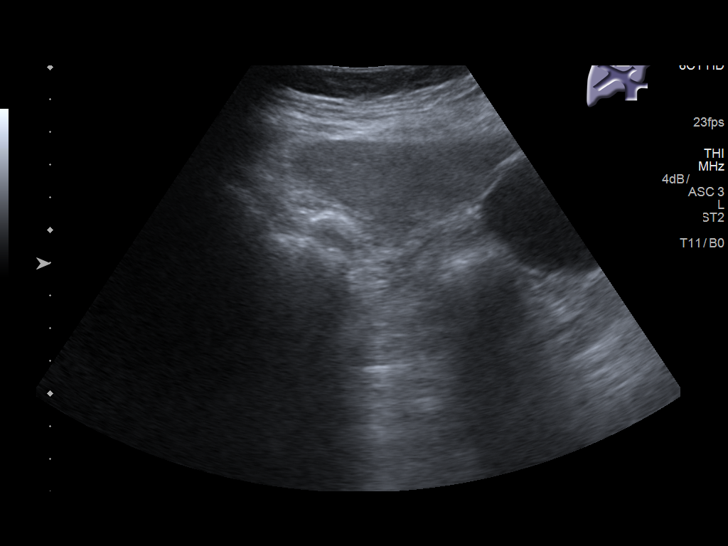
[im 71/78]
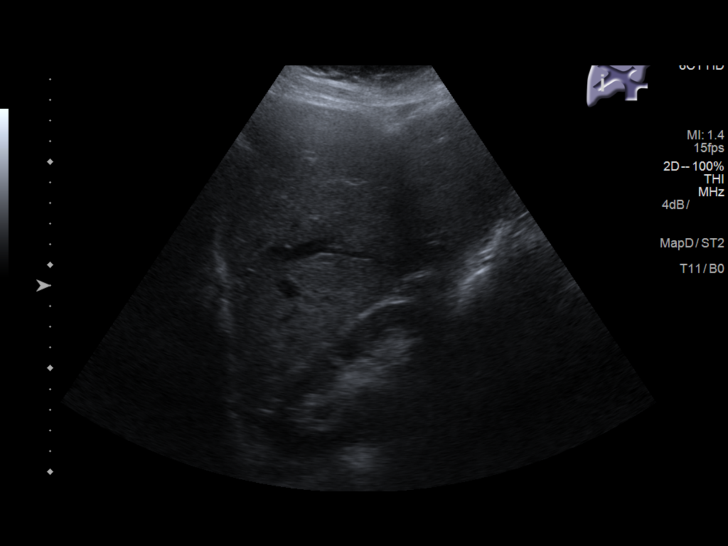
[im 78/78]
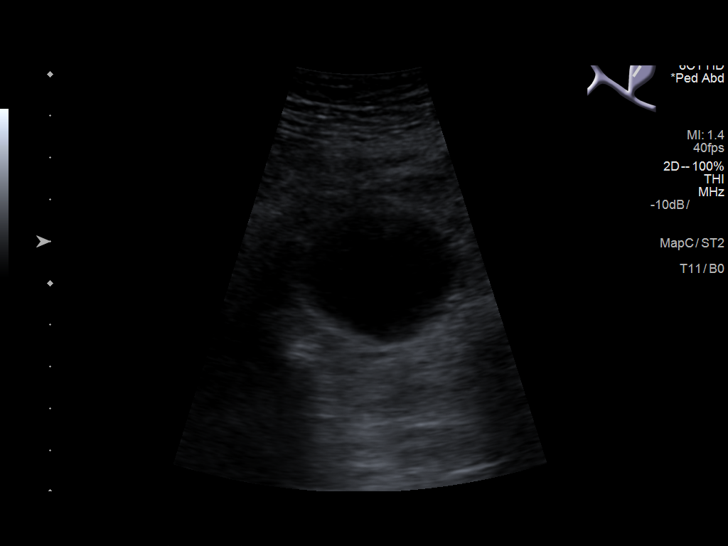

[14 of 25 positions shown; findings below may reference images not displayed]

FINDINGS: Gallbladder:

There is echogenic material dependently within the gallbladder,
mobile with some shadowing consistent with stones. Largest measures
1.3 cm. No wall thickening or pericholecystic fluid.

Common bile duct:

Diameter: 5-6 mm

Liver:

No focal lesion identified. Within normal limits in parenchymal
echogenicity. Portal vein is patent on color Doppler imaging with
normal direction of blood flow towards the liver.
IMPRESSION: 1. No acute findings.
2. Gallstones without evidence of acute cholecystitis. No bile duct
dilation.

## 2019-02-08 DIAGNOSIS — J449 Chronic obstructive pulmonary disease, unspecified: Secondary | ICD-10-CM | POA: Diagnosis not present

## 2019-02-09 ENCOUNTER — Other Ambulatory Visit: Payer: Self-pay | Admitting: Family Medicine

## 2019-02-21 DIAGNOSIS — J449 Chronic obstructive pulmonary disease, unspecified: Secondary | ICD-10-CM | POA: Diagnosis not present

## 2019-03-01 DIAGNOSIS — J449 Chronic obstructive pulmonary disease, unspecified: Secondary | ICD-10-CM | POA: Diagnosis not present

## 2019-03-11 DIAGNOSIS — J449 Chronic obstructive pulmonary disease, unspecified: Secondary | ICD-10-CM | POA: Diagnosis not present

## 2019-03-23 ENCOUNTER — Other Ambulatory Visit: Payer: Self-pay | Admitting: Family Medicine

## 2019-03-24 DIAGNOSIS — J449 Chronic obstructive pulmonary disease, unspecified: Secondary | ICD-10-CM | POA: Diagnosis not present

## 2019-03-30 ENCOUNTER — Encounter (INDEPENDENT_AMBULATORY_CARE_PROVIDER_SITE_OTHER): Payer: Medicare HMO

## 2019-03-30 ENCOUNTER — Ambulatory Visit (INDEPENDENT_AMBULATORY_CARE_PROVIDER_SITE_OTHER): Payer: Medicare HMO | Admitting: Nurse Practitioner

## 2019-03-31 ENCOUNTER — Other Ambulatory Visit: Payer: Self-pay | Admitting: Family Medicine

## 2019-03-31 DIAGNOSIS — J441 Chronic obstructive pulmonary disease with (acute) exacerbation: Secondary | ICD-10-CM

## 2019-04-01 DIAGNOSIS — J449 Chronic obstructive pulmonary disease, unspecified: Secondary | ICD-10-CM | POA: Diagnosis not present

## 2019-04-10 DIAGNOSIS — J449 Chronic obstructive pulmonary disease, unspecified: Secondary | ICD-10-CM | POA: Diagnosis not present

## 2019-04-23 ENCOUNTER — Encounter: Payer: Self-pay | Admitting: Family Medicine

## 2019-04-23 ENCOUNTER — Other Ambulatory Visit: Payer: Self-pay

## 2019-04-23 ENCOUNTER — Ambulatory Visit (INDEPENDENT_AMBULATORY_CARE_PROVIDER_SITE_OTHER): Payer: Medicare HMO | Admitting: Family Medicine

## 2019-04-23 VITALS — BP 100/62 | HR 97 | Temp 98.3°F | Wt 107.0 lb

## 2019-04-23 DIAGNOSIS — J9611 Chronic respiratory failure with hypoxia: Secondary | ICD-10-CM | POA: Diagnosis not present

## 2019-04-23 DIAGNOSIS — Z9981 Dependence on supplemental oxygen: Secondary | ICD-10-CM | POA: Diagnosis not present

## 2019-04-23 DIAGNOSIS — M810 Age-related osteoporosis without current pathological fracture: Secondary | ICD-10-CM

## 2019-04-23 DIAGNOSIS — I10 Essential (primary) hypertension: Secondary | ICD-10-CM

## 2019-04-23 DIAGNOSIS — E782 Mixed hyperlipidemia: Secondary | ICD-10-CM | POA: Diagnosis not present

## 2019-04-23 DIAGNOSIS — R609 Edema, unspecified: Secondary | ICD-10-CM | POA: Diagnosis not present

## 2019-04-23 DIAGNOSIS — Z720 Tobacco use: Secondary | ICD-10-CM | POA: Diagnosis not present

## 2019-04-23 NOTE — Progress Notes (Signed)
Hailey [name]  MRN: [id number] DOB: 1946/06/27  Subjective:  HPI   The patient is a 73 year old female who presents for evaluation of weight loss and right foot swelling.   She states that since taking Alandrenate she has been losing weight.  Her weight is January was 126 and today it is 107.  She states she stopped taking the Alandrenate about 1 month ago to see if her weight would go up but she has not seen that happen.  She also complains of right foot swelling.  She states she injured it about 9 months ago and it has been swelling since.  She states that some days are worse than others. The patient also complained that she was depressed and under increased stress.  She states that at times she has even thought of suicide.  She is under financial stress and the actions she has been taking to remedy that situation are causing more stress as it is taking her so long to resolve the issue.   [facility] 04/23/2019 08/08/2018 04/23/2018 01/14/2018 04/12/2017  Decreased Interest 0 0 1 0 0  Down, Depressed, Hopeless 1 1 0 0 0  PHQ - 2 Score 1 1 1  0 0  Altered sleeping 2 1 - 2 1  Tired, decreased energy 3 3 - 1 1  Change in appetite 2 3 - 0 0  Feeling bad or failure about yourself  0 0 - 0 0  Trouble concentrating 0 0 - 0 0  Moving slowly or fidgety/restless 0 0 - 0 0  Suicidal thoughts 0 1 - 0 0  PHQ-9 Score 8 9 - 3 2  Difficult doing work/chores Not difficult at all Somewhat difficult - Not difficult at all -    Patient Active Problem List   Diagnosis Date Noted  . Acute respiratory failure (HCC)   . Palliative care by specialist   . UTI (urinary tract infection) 01/17/2018  . Sepsis (HCC) 11/07/2016  . COPD exacerbation (HCC) 10/26/2016  . Abnormal chest sounds 08/29/2015  . Obstructive chronic bronchitis with exacerbation (HCC) 08/29/2015  . Anxiety and depression 08/29/2015  . BP (high blood pressure) 08/29/2015  . Carotid stenosis 06/23/2015  . DNR (do not  resuscitate) discussion 05/30/2015  . Tobacco use 05/30/2015  . Hyperlipidemia    Past Medical History:  Diagnosis Date  . Abnormal chest sounds 08/29/2015  . Acute respiratory failure (HCC)   . Anemia   . Anxiety   . BP (high blood pressure) 08/29/2015  . Carotid artery occlusion   . Carotid stenosis 06/23/2015  . COPD (chronic obstructive pulmonary disease) (HCC)   . COPD exacerbation (HCC) 10/26/2016  . Coronary artery disease   . Depression   . Dysrhythmia   . GERD (gastroesophageal reflux disease)   . History of orthopnea   . HOH (hard of hearing)   . Hyperlipidemia   . Hypertension   . Obstructive chronic bronchitis with exacerbation (HCC) 08/29/2015  . Osteoporosis   . Oxygen dependent    2L/MIN CONTINUOUS  . Palliative care by specialist   . Palpitations   . Seasonal allergies   . Shortness of breath dyspnea    Social History   Socioeconomic History  . Marital status: Divorced    Spouse name: Not on file  . Number of children: 2  . Years of education: Not on file  . Highest education level: 12th grade  Occupational History  . Occupation: retired  Engineer, productionocial Needs  .  Financial resource strain: Not hard at all  . Food insecurity:    Worry: Never true    Inability: Never true  . Transportation needs:    Medical: No    Non-medical: No  Tobacco Use  . Smoking status: Former Smoker    Packs/day: 1.00    Years: 45.00    Pack years: 45.00    Types: Cigarettes    Last attempt to quit: 12/08/2018    Years since quitting: 0.3  . Smokeless tobacco: Never Used  Substance and Sexual Activity  . Alcohol use: No    Alcohol/week: 0.0 standard drinks  . Drug use: Yes    Comment: prescribed  . Sexual activity: Not on file  Lifestyle  . Physical activity:    Days per week: 0 days    Minutes per session: 0 min  . Stress: Only a little  Relationships  . Social connections:    Talks on phone: Three times a week    Gets together: Never    Attends religious service: 1  to 4 times per year    Active member of club or organization: No    Attends meetings of clubs or organizations: Never    Relationship status: Divorced  . Intimate partner violence:    Fear of current or ex partner: No    Emotionally abused: No    Physically abused: No    Forced sexual activity: No  Other Topics Concern  . Not on file  Social History Narrative  . Not on file   Outpatient Encounter Medications as of 04/23/2019  Medication Sig Note  . acetaminophen (TYLENOL) 500 MG tablet Take 1,000 mg by mouth every 6 (six) hours as needed for mild pain or headache.    . albuterol (VENTOLIN HFA) 108 (90 Base) MCG/ACT inhaler TAKE 2 PUFFS BY MOUTH EVERY 6 HOURS AS NEEDED FOR WHEEZE OR SHORTNESS OF BREATH   . amLODipine (NORVASC) 10 MG tablet TAKE 1 TABLET BY MOUTH EVERY DAY (Patient taking differently: Take 10 mg by mouth every evening. )   . aspirin 81 MG chewable tablet Chew 81 mg by mouth at bedtime.    Marland Kitchen atorvastatin (LIPITOR) 10 MG tablet TAKE 1 TABLET BY MOUTH EVERY DAY (Patient taking differently: Take 10 mg by mouth at bedtime. )   . bisacodyl (BISACODYL) 5 MG EC tablet Take 5 mg by mouth daily as needed for moderate constipation.   . calcium carbonate (OSCAL) 1500 (600 Ca) MG TABS tablet Take 600 mg of elemental calcium by mouth 2 (two) times daily.    . cetirizine (ZYRTEC) 10 MG tablet Take 10 mg by mouth at bedtime.    . Cholecalciferol (VITAMIN D3) 2000 units TABS Take 2,000 Units by mouth at bedtime.    . ferrous sulfate 325 (65 FE) MG tablet Take 325 mg by mouth at bedtime.    Marland Kitchen HYDROcodone-acetaminophen (NORCO/VICODIN) 5-325 MG tablet Take 1 tablet by mouth 2 (two) times daily as needed for moderate pain.   Marland Kitchen ipratropium-albuterol (DUONEB) 0.5-2.5 (3) MG/3ML SOLN Take 3 mLs by nebulization every 6 (six) hours as needed.   . Multiple Vitamin (MULTIVITAMIN) tablet Take 1 tablet by mouth at bedtime.    . sodium chloride (OCEAN) 0.65 % SOLN nasal spray Place 1 spray into both  nostrils as needed (dryness).   . SPIRIVA HANDIHALER 18 MCG inhalation capsule INHALE 1 CAPSULE ONCE A DAY   . SUPER B COMPLEX/C PO Take 1 tablet by mouth at bedtime.   Marland Kitchen  SYMBICORT 160-4.5 MCG/ACT inhaler INHALE 2 PUFFS INTO THE LUNGS 2 (TWO) TIMES DAILY.   . traZODone (DESYREL) 50 MG tablet Take 0.5-1 tablets (25-50 mg total) by mouth at bedtime as needed for sleep.   . vitamin E 400 UNIT capsule Take 400 Units by mouth at bedtime.    Marland Kitchen alendronate (FOSAMAX) 70 MG tablet Take 1 tablet (70 mg total) by mouth every 7 (seven) days. Take with a full glass of water on an empty stomach. (Patient not taking: Reported on 04/23/2019) 12/08/2018: Patient concerned about missing dose this AM  . [DISCONTINUED] predniSONE (DELTASONE) 10 MG tablet Label  & dispense according to the schedule below. 5 Pills PO for 1 day then, 4 Pills PO for 1 day, 3 Pills PO for 1 day, 2 Pills PO for 1 day, 1 Pill PO for 1 days then STOP.    No facility-administered encounter medications on file as of 04/23/2019.     Allergies  Allergen Reactions  . Codeine Anxiety        Review of Systems  Constitutional: Negative for fever.  Respiratory: Positive for cough (sinus and allergies), sputum production (yellow to green) and shortness of breath (chronic but unchanged). Negative for hemoptysis and wheezing.   Cardiovascular: Positive for leg swelling (right foot).  Psychiatric/Behavioral: Positive for depression and suicidal ideas. Negative for hallucinations. The patient has insomnia. The patient is not nervous/anxious.     Objective:  BP 100/62 (BP Location: Right Arm, Patient Position: Sitting, Cuff Size: Normal)   Pulse 97   Temp 98.3 F (36.8 C) (Oral)   Wt 107 lb (48.5 kg)   SpO2 95%   BMI 17.81 kg/m   Wt Readings from Last 3 Encounters:  04/23/19 107 lb (48.5 kg)  12/16/18 126 lb 3.2 oz (57.2 kg)  12/08/18 126 lb (57.2 kg)   Physical Exam  Constitutional: She is oriented to person, place, and time.  Cachetic.  Dyspnea due to COPD with chronic respiratory failure and hypoxia.  HENT:  Head: Normocephalic.  Eyes: Conjunctivae are normal.  Neck: Neck supple.  Cardiovascular: Normal rate.  Pulmonary/Chest: She is in respiratory distress. She has no wheezes. She has no rales.  Distant breath sounds with significant dorsal kyphosis.  Abdominal: Soft. Bowel sounds are normal.  Musculoskeletal: Normal range of motion.        General: Edema present.     Comments: Thoracic dorsal kyphosis with history of osteoporosis. Mild puffiness of the right ankle (no pitting) without deformities or blue cold feet. Pulses are symmetric.  Neurological: She is alert and oriented to person, place, and time. She has normal reflexes.  Skin: No rash noted.  Psychiatric: Mood, affect and judgment normal.    Assessment and Plan :  1. Edema, unspecified type Some right foot puffiness for the past several weeks. No recent injury but has had multiple injuries to toes in the past. No pain today and no pitting. With 20 lb weight loss over the past 4-5 months, will check labs for electrolyte imbalances and signs of malnutrition due to respiratory failure. Encouraged to elevate legs when ever possible. - CBC with Differential/Platelet - Comprehensive metabolic panel  2. Chronic respiratory failure with hypoxia, on home O2 therapy (HCC) Continues to get oxygen 24 hrs a day by nasal cannula. Also, takes Spiriva 18 mcg qd, Ventolin-HFA prn rescue, Symbicort 160-4.5 mcg/ACT 2 puffs BID and Duoneb to use by nebulizer if needed (has not used it in several months). Pulse oximetry on 2 LPM is  96-97% at home but drops to 88% without the oxygen - 95% in the office today on 2LPM. Recommend 3 meals regularly each day and 2 snacks (suspect malnutrition secondary to COPD with Respiratory Failure).  3. Mixed hyperlipidemia Tolerating the Atorvastatin 10 mg hs. Will recheck CMP and Lipid Panel today. - Comprehensive metabolic panel - Lipid panel   4. Tobacco use Encouraged to stop all smoking. Still getting 1.5 ppd. Not willing to stop "right now". Feels it helps with "stress". Recommend she use the Trazodone 25-50 mg hs. Recommend she stop the Zoloft she had been using intermittently.  5. Essential hypertension Stable on the Amlodipine 10 mg q evening. Some puffiness of the right ankle only. No worsening of dyspnea. Recheck CBC and CMP. - CBC with Differential/Platelet - Comprehensive metabolic panel  6. Age-related osteoporosis without current pathological fracture BMD of 09-01-18 confirmed osteoporosis and was started on Fosamax. Patient read the side effect profile and decided to stop it. Feels she takes "too many medicines" and will continue the Calcium with Vitamin D (Caltrate-D) but will not restart the Fosamax. Still smoking 1.5 ppd.

## 2019-04-24 ENCOUNTER — Telehealth: Payer: Self-pay

## 2019-04-24 LAB — COMPREHENSIVE METABOLIC PANEL
ALT: 12 IU/L (ref 0–32)
AST: 17 IU/L (ref 0–40)
Albumin/Globulin Ratio: 2.1 (ref 1.2–2.2)
Albumin: 4.4 g/dL (ref 3.7–4.7)
Alkaline Phosphatase: 85 IU/L (ref 39–117)
BUN/Creatinine Ratio: 15 (ref 12–28)
BUN: 10 mg/dL (ref 8–27)
Bilirubin Total: 0.4 mg/dL (ref 0.0–1.2)
CO2: 36 mmol/L — ABNORMAL HIGH (ref 20–29)
Calcium: 11.2 mg/dL — ABNORMAL HIGH (ref 8.7–10.3)
Chloride: 89 mmol/L — ABNORMAL LOW (ref 96–106)
Creatinine, Ser: 0.68 mg/dL (ref 0.57–1.00)
GFR calc Af Amer: 102 mL/min/{1.73_m2} (ref >59)
GFR calc non Af Amer: 88 mL/min/{1.73_m2} (ref >59)
Globulin, Total: 2.1 g/dL (ref 1.5–4.5)
Glucose: 104 mg/dL — ABNORMAL HIGH (ref 65–99)
Potassium: 3.7 mmol/L (ref 3.5–5.2)
Sodium: 141 mmol/L (ref 134–144)
Total Protein: 6.5 g/dL (ref 6.0–8.5)

## 2019-04-24 LAB — CBC WITH DIFFERENTIAL/PLATELET
Basophils Absolute: 0.1 10*3/uL (ref 0.0–0.2)
Basos: 1 %
EOS (ABSOLUTE): 0.1 10*3/uL (ref 0.0–0.4)
Eos: 1 %
Hematocrit: 36.5 % (ref 34.0–46.6)
Hemoglobin: 12.1 g/dL (ref 11.1–15.9)
Immature Grans (Abs): 0 10*3/uL (ref 0.0–0.1)
Immature Granulocytes: 0 %
Lymphocytes Absolute: 2 10*3/uL (ref 0.7–3.1)
Lymphs: 18 %
MCH: 31.6 pg (ref 26.6–33.0)
MCHC: 33.2 g/dL (ref 31.5–35.7)
MCV: 95 fL (ref 79–97)
Monocytes Absolute: 0.7 10*3/uL (ref 0.1–0.9)
Monocytes: 6 %
Neutrophils Absolute: 7.9 10*3/uL — ABNORMAL HIGH (ref 1.4–7.0)
Neutrophils: 74 %
Platelets: 350 10*3/uL (ref 150–450)
RBC: 3.83 x10E6/uL (ref 3.77–5.28)
RDW: 11.3 % — ABNORMAL LOW (ref 11.7–15.4)
WBC: 10.7 10*3/uL (ref 3.4–10.8)

## 2019-04-24 LAB — LIPID PANEL
Chol/HDL Ratio: 1.9 ratio (ref 0.0–4.4)
Cholesterol, Total: 147 mg/dL (ref 100–199)
HDL: 78 mg/dL (ref >39)
LDL Calculated: 51 mg/dL (ref 0–99)
Triglycerides: 92 mg/dL (ref 0–149)
VLDL Cholesterol Cal: 18 mg/dL (ref 5–40)

## 2019-04-24 NOTE — Telephone Encounter (Signed)
-----   Message from Tamsen Roers, [place] sent at 04/24/2019 10:42 AM EDT ----- Cholesterol and blood counts normal (no anemia). Normal kidney and liver function tests. Chloride drop and CO2 elevation because of chronic COPD with hypoxia. Calcium slightly elevated. Recommend getting four 8 ounce glasses of water a day. Recheck appointment in 3-4 months. Still need to stop smoking.

## 2019-04-24 NOTE — Telephone Encounter (Signed)
Lab results and recommendations given to patient.

## 2019-04-27 ENCOUNTER — Telehealth: Payer: Self-pay

## 2019-04-27 NOTE — Telephone Encounter (Signed)
Called pt to inquire about telephonic AWV and pt stated that would be fine. Kept apt on 04/29/19 @ 10 AM. Pt also wanted to let Maurine Minister know that she took the Trazodone Friday and Saturday night and she did not like the way it made her feel. Her O2 was 75% in the AM and she was able to get it up to 86% with rescue inhaler and nebulizer. Pt is no longer taking the Trazodone and she has started back on Zoloft yesterday. Her O2 this AM was 93%. FYI! -MM

## 2019-04-27 NOTE — Telephone Encounter (Signed)
Thanks. Agree with change and improvement in oxygen levels.

## 2019-04-29 ENCOUNTER — Ambulatory Visit (INDEPENDENT_AMBULATORY_CARE_PROVIDER_SITE_OTHER): Payer: Medicare HMO

## 2019-04-29 ENCOUNTER — Other Ambulatory Visit: Payer: Self-pay

## 2019-04-29 DIAGNOSIS — Z0000 Encounter for general adult medical examination without abnormal findings: Secondary | ICD-10-CM

## 2019-04-29 NOTE — Telephone Encounter (Signed)
Pt advised. - MM

## 2019-04-29 NOTE — Progress Notes (Addendum)
Subjective:   Hailey Gibbs is a 73 y.o. female who presents for Medicare Annual (Subsequent) preventive examination.    This visit is being conducted through telemedicine due to the COVID-19 pandemic. This patient has given me verbal consent via doximity to conduct this visit, patient states they are participating from their home address. Some vital signs may be absent or patient reported.    Patient identification: identified by name, DOB, and current address  Review of Systems:  N/A  Cardiac Risk Factors include: advanced age (>58men, >91 women);dyslipidemia;hypertension;smoking/ tobacco exposure     Objective:     Vitals: There were no vitals taken for this visit.  There is no height or weight on file to calculate BMI. Unable to obtain vitals due to visit being conducted via telephonically.   Advanced Directives 04/29/2019 12/08/2018 12/08/2018 04/23/2018 02/11/2018 02/09/2018 01/17/2018  Does Patient Have a Medical Advance Directive? No No No No No No No  Would patient like information on creating a medical advance directive? No - Patient declined No - Patient declined No - Patient declined No - Patient declined - - No - Patient declined    Tobacco Social History   Tobacco Use  Smoking Status Current Every Day Smoker  . Packs/day: 1.50  . Years: 45.00  . Pack years: 67.50  . Types: Cigarettes  . Last attempt to quit: 12/08/2018  . Years since quitting: 0.3  Smokeless Tobacco Never Used     Ready to quit: No Counseling given: Not Answered   Clinical Intake:  Pre-visit preparation completed: Yes  Pain : No/denies pain Pain Score: 0-No pain     Nutritional Status: BMI of 19-24  Normal Nutritional Risks: None Diabetes: No  How often do you need to have someone help you when you read instructions, pamphlets, or other written materials from your doctor or pharmacy?: 1 - Never  Interpreter Needed?: No  Information entered by :: MMarkoski, LPN  Past Medical  History:  Diagnosis Date  . Abnormal chest sounds 08/29/2015  . Acute respiratory failure (HCC)   . Anemia   . Anxiety   . BP (high blood pressure) 08/29/2015  . Carotid artery occlusion   . Carotid stenosis 06/23/2015  . COPD (chronic obstructive pulmonary disease) (HCC)   . COPD exacerbation (HCC) 10/26/2016  . Coronary artery disease   . Depression   . Dysrhythmia   . GERD (gastroesophageal reflux disease)   . History of orthopnea   . HOH (hard of hearing)   . Hyperlipidemia   . Hypertension   . Obstructive chronic bronchitis with exacerbation (HCC) 08/29/2015  . Osteoporosis   . Oxygen dependent    2L/MIN CONTINUOUS  . Palliative care by specialist   . Palpitations   . Seasonal allergies   . Shortness of breath dyspnea    Past Surgical History:  Procedure Laterality Date  . CATARACT EXTRACTION W/PHACO Left 11/04/2018   Procedure: CATARACT EXTRACTION PHACO AND INTRAOCULAR LENS PLACEMENT (IOC);  Surgeon: Galen Manila, MD;  Location: [facility] ORS;  Service: Ophthalmology;  Laterality: Left;  [place] 00:49.1 CDE 6.32 Fluid Pack Lot # [id number]  . CATARACT EXTRACTION W/PHACO Right 11/25/2018   Procedure: CATARACT EXTRACTION PHACO AND INTRAOCULAR LENS PLACEMENT (IOC) RIGHT;  Surgeon: Galen Manila, MD;  Location: [facility] ORS;  Service: Ophthalmology;  Laterality: Right;  [place]   00:51 CDE  8.93 Fluid pack lot #[id number]  . CATARACT EXTRACTION, BILATERAL Bilateral   . ENDARTERECTOMY Left 06/23/2015   Procedure: ENDARTERECTOMY CAROTID;  Surgeon: Barbara Cower  Driscilla Grammes, MD;  Location: [facility] ORS;  Service: Vascular;  Laterality: Left;  . TUBAL LIGATION    . VAGINAL HYSTERECTOMY     Family History  Problem Relation Age of Onset  . Heart disease Father 16       CABG   . Hyperlipidemia Father   . Pneumonia Father   . Dementia Mother   . COPD Mother   . Diabetes Sister   . Parkinson's disease Brother   . Post-traumatic stress disorder Son    Social History   Socioeconomic History  . Marital  status: Divorced    Spouse name: Not on file  . Number of children: 2  . Years of education: Not on file  . Highest education level: 12th grade  Occupational History  . Occupation: retired  Engineer, production  . Financial resource strain: Not hard at all  . Food insecurity:    Worry: Never true    Inability: Never true  . Transportation needs:    Medical: No    Non-medical: No  Tobacco Use  . Smoking status: Current Every Day Smoker    Packs/day: 1.50    Years: 45.00    Pack years: 67.50    Types: Cigarettes    Last attempt to quit: 12/08/2018    Years since quitting: 0.3  . Smokeless tobacco: Never Used  Substance and Sexual Activity  . Alcohol use: No    Alcohol/week: 0.0 standard drinks  . Drug use: Yes    Comment: prescribed  . Sexual activity: Not on file  Lifestyle  . Physical activity:    Days per week: 0 days    Minutes per session: 0 min  . Stress: Only a little  Relationships  . Social connections:    Talks on phone: Patient refused    Gets together: Patient refused    Attends religious service: Patient refused    Active member of club or organization: Patient refused    Attends meetings of clubs or organizations: Patient refused    Relationship status: Patient refused  Other Topics Concern  . Not on file  Social History Narrative  . Not on file    Outpatient Encounter Medications as of 04/29/2019  Medication Sig  . acetaminophen (TYLENOL) 500 MG tablet Take 1,000 mg by mouth every 6 (six) hours as needed for mild pain or headache.   . albuterol (VENTOLIN HFA) 108 (90 Base) MCG/ACT inhaler TAKE 2 PUFFS BY MOUTH EVERY 6 HOURS AS NEEDED FOR WHEEZE OR SHORTNESS OF BREATH  . amLODipine (NORVASC) 10 MG tablet TAKE 1 TABLET BY MOUTH EVERY DAY (Patient taking differently: Take 10 mg by mouth every evening. )  . aspirin 81 MG chewable tablet Chew 81 mg by mouth at bedtime.   Marland Kitchen atorvastatin (LIPITOR) 10 MG tablet TAKE 1 TABLET BY MOUTH EVERY DAY (Patient taking  differently: Take 10 mg by mouth at bedtime. )  . bisacodyl (BISACODYL) 5 MG EC tablet Take 5 mg by mouth daily as needed for moderate constipation.  . calcium carbonate (OSCAL) 1500 (600 Ca) MG TABS tablet Take 600 mg of elemental calcium by mouth 2 (two) times daily.   . cetirizine (ZYRTEC) 10 MG tablet Take 10 mg by mouth at bedtime.   . Cholecalciferol (VITAMIN D3) 2000 units TABS Take 2,000 Units by mouth at bedtime.   . ferrous sulfate 325 (65 FE) MG tablet Take 325 mg by mouth at bedtime.   Marland Kitchen HYDROcodone-acetaminophen (NORCO/VICODIN) 5-325 MG tablet Take 1 tablet by  mouth 2 (two) times daily as needed for moderate pain.  Marland Kitchen. ipratropium-albuterol (DUONEB) 0.5-2.5 (3) MG/3ML SOLN Take 3 mLs by nebulization every 6 (six) hours as needed.  . Multiple Vitamin (MULTIVITAMIN) tablet Take 1 tablet by mouth at bedtime.   . sertraline (ZOLOFT) 100 MG tablet Take 100 mg by mouth daily.  . sodium chloride (OCEAN) 0.65 % SOLN nasal spray Place 1 spray into both nostrils as needed (dryness).  . SPIRIVA HANDIHALER 18 MCG inhalation capsule INHALE 1 CAPSULE ONCE A DAY  . SUPER B COMPLEX/C PO Take 1 tablet by mouth at bedtime.  . SYMBICORT 160-4.5 MCG/ACT inhaler INHALE 2 PUFFS INTO THE LUNGS 2 (TWO) TIMES DAILY.  . vitamin E 400 UNIT capsule Take 400 Units by mouth at bedtime.   Marland Kitchen. alendronate (FOSAMAX) 70 MG tablet Take 1 tablet (70 mg total) by mouth every 7 (seven) days. Take with a full glass of water on an empty stomach. (Patient not taking: Reported on 04/23/2019)  . traZODone (DESYREL) 50 MG tablet Take 0.5-1 tablets (25-50 mg total) by mouth at bedtime as needed for sleep. (Patient not taking: Reported on 04/29/2019)   No facility-administered encounter medications on file as of 04/29/2019.     Activities of Daily Living In your present state of health, do you have any difficulty performing the following activities: 04/29/2019 12/08/2018  Hearing? Y Y  Comment Does not wear hearing aids. -  Vision?  N N  Difficulty concentrating or making decisions? N N  Walking or climbing stairs? N Y  Dressing or bathing? N N  Doing errands, shopping? N N  Preparing Food and eating ? N -  Using the Toilet? N -  In the past six months, have you accidently leaked urine? Y -  Comment Occasionally with pressure- wears pads daily.  -  Do you have problems with loss of bowel control? N -  Managing your Medications? N -  Managing your Finances? N -  Housekeeping or managing your Housekeeping? N -  Some recent data might be hidden    Patient Care Team: Chrismon, Jodell Ciproennis E, PA as PCP - General (Physician Assistant) Wyn Quakerew, Marlow BaarsJason S, MD as Referring Physician (Vascular Surgery)    Assessment:   This is a routine wellness examination for Adea.  Exercise Activities and Dietary recommendations Current Exercise Habits: The patient does not participate in regular exercise at present, Exercise limited by: None identified  Goals    . DIET - INCREASE WATER INTAKE     Recommend increasing water intake to 4-6 glasses a day.     . Exercise 3x per week (30 min per time)     Recommend to exercise for 3 days a week for at least 30 minutes at a time.     . Have 3 meals a day     Recommend eating 3 small meals a day with two healthy snack in between.    . Increase water intake     Recommend increasing water intake to 2-3 glasses a day.     Marland Kitchen. LIFESTYLE - DECREASE FALLS RISK     Recommend to remove any items from the home that may cause slips or trips.       Fall Risk: Fall Risk  04/29/2019 08/08/2018 04/23/2018 01/14/2018 04/12/2017  Falls in the past year? 1 No Yes Yes Yes  Number falls in past yr: 0 - 2 or more 1 2 or more  Injury with Fall? 0 - No No Yes  Comment - - - -  broke toe  Risk Factor Category  - - - - High Fall Risk  Risk for fall due to : - - - - History of fall(s)  Follow up Falls prevention discussed - Falls prevention discussed - Falls prevention discussed    FALL RISK PREVENTION  PERTAINING TO THE HOME:  Any stairs in or around the home? Yes  If so, are there any without handrails? Yes   Home free of loose throw rugs in walkways, pet beds, electrical cords, etc? Yes  Adequate lighting in your home to reduce risk of falls? Yes   ASSISTIVE DEVICES UTILIZED TO PREVENT FALLS:  Life alert? No  Use of a cane, walker or w/c? No  Grab bars in the bathroom? No  Shower chair or bench in shower? No  Elevated toilet seat or a handicapped toilet? No    TIMED UP AND GO:  Was the test performed? No .    Depression Screen PHQ 2/9 Scores 04/29/2019 04/23/2019 08/08/2018 04/23/2018  PHQ - 2 Score PHQ- 9 Score - 8 9 -     Cognitive Function: Declined today.     6CIT Screen 04/12/2017  What Year? 0 points  What month? 0 points  What time? 0 points  Count back from 20 0 points  Months in reverse 0 points  Repeat phrase 4 points  Total Score 4    Immunization History  Administered Date(s) Administered  . Influenza, High Dose Seasonal PF 12/10/2018  . Pneumococcal Conjugate-13 04/12/2017  . Pneumococcal Polysaccharide-23 04/23/2018  . Tdap 03/14/2018    Qualifies for Shingles Vaccine? Yes . Due for Shingrix. Education has been provided regarding the importance of this vaccine. Pt has been advised to call insurance company to determine out of pocket expense. Advised may also receive vaccine at local pharmacy or Health Dept. Verbalized acceptance and understanding.  Tdap: Up to date  Flu Vaccine: Up to date  Pneumococcal Vaccine: Up to date  Screening Tests Health Maintenance  Topic Date Due  . MAMMOGRAM  05/05/1997  . COLONOSCOPY  12/10/2026 (Originally 05/05/1997)  . INFLUENZA VACCINE  07/11/2019  . DEXA SCAN  09/01/2020  . TETANUS/TDAP  03/14/2028  . Hepatitis C Screening  Completed  . PNA vac Low Risk Adult  Completed    Cancer Screenings:  Colorectal Screening: Pt declined referral today.   Mammogram: Pt declined referral today.   Bone  Density: Completed 09/01/18. Results reflect OSTEOPOROSIS. Repeat every 2 years.   Lung Cancer Screening: (Low Dose CT Chest recommended if Age 61-80 years, 30 pack-year currently smoking OR have quit w/in 15years.) does qualify however is not due.   Additional Screening:  Hepatitis C Screening: Up to date  Vision Screening: Recommended annual ophthalmology exams for early detection of glaucoma and other disorders of the eye.  Dental Screening: Recommended annual dental exams for proper oral hygiene  Community Resource Referral:  CRR required this visit?  No       Plan:  I have personally reviewed and addressed the Medicare Annual Wellness questionnaire and have noted the following in the patient's chart:  A. Medical and social history B. Use of alcohol, tobacco or illicit drugs  C. Current medications and supplements D. Functional ability and status E.  Nutritional status F.  Physical activity G. Advance directives H. List of other physicians I.  Hospitalizations, surgeries, and ER visits in previous 12 months J.  Vitals K. Screenings such as hearing and vision if needed, cognitive and depression  L. Referrals and appointments   In addition, I have reviewed and discussed with patient certain preventive protocols, quality metrics, and best practice recommendations. A written personalized care plan for preventive services as well as general preventive health recommendations were provided to patient. Nurse Health Advisor  Signed,    Preston Weill [place], [place]  [phone] Nurse Health Advisor   Nurse Notes: Pt declined a mammogram and colonoscopy referral today.   Reviewed screening documentation of Nurse Health Advisor. Agree with note and plan.

## 2019-04-29 NOTE — Patient Instructions (Signed)
Hailey Gibbs , Thank you for taking time to come for your Medicare Wellness Visit. I appreciate your ongoing commitment to your health goals. Please review the following plan we discussed and let me know if I can assist you in the future.   Screening recommendations/referrals: Colonoscopy: Pt declines referral today.  Mammogram: Pt declines referral today.  Bone Density: Up to date, due 08/2020 Recommended yearly ophthalmology/optometry visit for glaucoma screening and checkup Recommended yearly dental visit for hygiene and checkup  Vaccinations: Influenza vaccine: Up to date Pneumococcal vaccine: Completed series Tdap vaccine: Up to date, due 03/2028 Shingles vaccine: Pt declines today.     Advanced directives: Advance directive discussed with you today. Even though you declined this today please call our office should you change your mind and we can give you the proper paperwork for you to fill out.   Conditions/risks identified: Fall risk prevention discussed today. Recommend to increase water intake, start exercising and work to decrease sugars in daily diet.   Next appointment: 07/14/19 @ 1:20 PM with Maurine Minister Chrismon.    Preventive Care 72 Years and Older, Female Preventive care refers to lifestyle choices and visits with your health care provider that can promote health and wellness. What does preventive care include?  A yearly physical exam. This is also called an annual well check.  Dental exams once or twice a year.  Routine eye exams. Ask your health care provider how often you should have your eyes checked.  Personal lifestyle choices, including:  Daily care of your teeth and gums.  Regular physical activity.  Eating a healthy diet.  Avoiding tobacco and drug use.  Limiting alcohol use.  Practicing safe sex.  Taking low-dose aspirin every day.  Taking vitamin and mineral supplements as recommended by your health care provider. What happens during an annual  well check? The services and screenings done by your health care provider during your annual well check will depend on your age, overall health, lifestyle risk factors, and family history of disease. Counseling  Your health care provider may ask you questions about your:  Alcohol use.  Tobacco use.  Drug use.  Emotional well-being.  Home and relationship well-being.  Sexual activity.  Eating habits.  History of falls.  Memory and ability to understand (cognition).  Work and work Astronomer.  Reproductive health. Screening  You may have the following tests or measurements:  Height, weight, and BMI.  Blood pressure.  Lipid and cholesterol levels. These may be checked every 5 years, or more frequently if you are over 5 years old.  Skin check.  Lung cancer screening. You may have this screening every year starting at age 65 if you have a 30-pack-year history of smoking and currently smoke or have quit within the past 15 years.  Fecal occult blood test (FOBT) of the stool. You may have this test every year starting at age 37.  Flexible sigmoidoscopy or colonoscopy. You may have a sigmoidoscopy every 5 years or a colonoscopy every 10 years starting at age 74.  Hepatitis C blood test.  Hepatitis B blood test.  Sexually transmitted disease (STD) testing.  Diabetes screening. This is done by checking your blood sugar (glucose) after you have not eaten for a while (fasting). You may have this done every 1-3 years.  Bone density scan. This is done to screen for osteoporosis. You may have this done starting at age 38.  Mammogram. This may be done every 1-2 years. Talk to your health care provider  about how often you should have regular mammograms. Talk with your health care provider about your test results, treatment options, and if necessary, the need for more tests. Vaccines  Your health care provider may recommend certain vaccines, such as:  Influenza vaccine. This  is recommended every year.  Tetanus, diphtheria, and acellular pertussis (Tdap, Td) vaccine. You may need a Td booster every 10 years.  Zoster vaccine. You may need this after age 66.  Pneumococcal 13-valent conjugate (PCV13) vaccine. One dose is recommended after age 80.  Pneumococcal polysaccharide (PPSV23) vaccine. One dose is recommended after age 23. Talk to your health care provider about which screenings and vaccines you need and how often you need them. This information is not intended to replace advice given to you by your health care provider. Make sure you discuss any questions you have with your health care provider. Document Released: 12/23/2015 Document Revised: 08/15/2016 Document Reviewed: 09/27/2015 Elsevier Interactive Patient Education  2017 [employer] Prevention in the Home Falls can cause injuries. They can happen to people of all ages. There are many things you can do to make your home safe and to help prevent falls. What can I do on the outside of my home?  Regularly fix the edges of walkways and driveways and fix any cracks.  Remove anything that might make you trip as you walk through a door, such as a raised step or threshold.  Trim any bushes or trees on the path to your home.  Use bright outdoor lighting.  Clear any walking paths of anything that might make someone trip, such as rocks or tools.  Regularly check to see if handrails are loose or broken. Make sure that both sides of any steps have handrails.  Any raised decks and porches should have guardrails on the edges.  Have any leaves, snow, or ice cleared regularly.  Use sand or salt on walking paths during winter.  Clean up any spills in your garage right away. This includes oil or grease spills. What can I do in the bathroom?  Use night lights.  Install grab bars by the toilet and in the tub and shower. Do not use towel bars as grab bars.  Use non-skid mats or decals in the tub or  shower.  If you need to sit down in the shower, use a plastic, non-slip stool.  Keep the floor dry. Clean up any water that spills on the floor as soon as it happens.  Remove soap buildup in the tub or shower regularly.  Attach bath mats securely with double-sided non-slip rug tape.  Do not have throw rugs and other things on the floor that can make you trip. What can I do in the bedroom?  Use night lights.  Make sure that you have a light by your bed that is easy to reach.  Do not use any sheets or blankets that are too big for your bed. They should not hang down onto the floor.  Have a firm chair that has side arms. You can use this for support while you get dressed.  Do not have throw rugs and other things on the floor that can make you trip. What can I do in the kitchen?  Clean up any spills right away.  Avoid walking on wet floors.  Keep items that you use a lot in easy-to-reach places.  If you need to reach something above you, use a strong step stool that has a grab bar.  Keep electrical cords out of the way.  Do not use floor polish or wax that makes floors slippery. If you must use wax, use non-skid floor wax.  Do not have throw rugs and other things on the floor that can make you trip. What can I do with my stairs?  Do not leave any items on the stairs.  Make sure that there are handrails on both sides of the stairs and use them. Fix handrails that are broken or loose. Make sure that handrails are as long as the stairways.  Check any carpeting to make sure that it is firmly attached to the stairs. Fix any carpet that is loose or worn.  Avoid having throw rugs at the top or bottom of the stairs. If you do have throw rugs, attach them to the floor with carpet tape.  Make sure that you have a light switch at the top of the stairs and the bottom of the stairs. If you do not have them, ask someone to add them for you. What else can I do to help prevent falls?   Wear shoes that:  Do not have high heels.  Have rubber bottoms.  Are comfortable and fit you well.  Are closed at the toe. Do not wear sandals.  If you use a stepladder:  Make sure that it is fully opened. Do not climb a closed stepladder.  Make sure that both sides of the stepladder are locked into place.  Ask someone to hold it for you, if possible.  Clearly mark and make sure that you can see:  Any grab bars or handrails.  First and last steps.  Where the edge of each step is.  Use tools that help you move around (mobility aids) if they are needed. These include:  Canes.  Walkers.  Scooters.  Crutches.  Turn on the lights when you go into a dark area. Replace any light bulbs as soon as they burn out.  Set up your furniture so you have a clear path. Avoid moving your furniture around.  If any of your floors are uneven, fix them.  If there are any pets around you, be aware of where they are.  Review your medicines with your doctor. Some medicines can make you feel dizzy. This can increase your chance of falling. Ask your doctor what other things that you can do to help prevent falls. This information is not intended to replace advice given to you by your health care provider. Make sure you discuss any questions you have with your health care provider. Document Released: 09/22/2009 Document Revised: 05/03/2016 Document Reviewed: 12/31/2014 Elsevier Interactive Patient Education  2017 [employer].

## 2019-05-01 DIAGNOSIS — J449 Chronic obstructive pulmonary disease, unspecified: Secondary | ICD-10-CM | POA: Diagnosis not present

## 2019-05-11 DIAGNOSIS — J449 Chronic obstructive pulmonary disease, unspecified: Secondary | ICD-10-CM | POA: Diagnosis not present

## 2019-05-30 ENCOUNTER — Other Ambulatory Visit: Payer: Self-pay | Admitting: Family Medicine

## 2019-06-01 DIAGNOSIS — J449 Chronic obstructive pulmonary disease, unspecified: Secondary | ICD-10-CM | POA: Diagnosis not present

## 2019-06-05 ENCOUNTER — Other Ambulatory Visit: Payer: Self-pay | Admitting: Family Medicine

## 2019-06-10 DIAGNOSIS — J449 Chronic obstructive pulmonary disease, unspecified: Secondary | ICD-10-CM | POA: Diagnosis not present

## 2019-06-18 ENCOUNTER — Telehealth: Payer: Self-pay | Admitting: Family Medicine

## 2019-06-18 NOTE — Telephone Encounter (Signed)
Please review

## 2019-06-18 NOTE — Telephone Encounter (Signed)
Patient states she has lost weight down to 101 lbs now. Some abdominal discomfort and very little appetite. Using Ensure supplements but eating very little. Concerned about history of gallstones possibly causing discomfort and wasting. She does not want to go to the hospital. She wants to schedule appointment tomorrow for labs and possible recheck of GB ultrasound or CT scan.

## 2019-06-18 NOTE — Telephone Encounter (Signed)
Pt thinks she is having problems with her gallbladder stones acting up.  Pain on her right upper cage.  Not hungry.  Drinking protein shakes   Weight loss  Does not want to come in but would like Simona Huh to call her  CB#  [phone]  Thanks teri

## 2019-06-19 ENCOUNTER — Ambulatory Visit: Payer: Medicare HMO | Admitting: Family Medicine

## 2019-06-19 ENCOUNTER — Inpatient Hospital Stay
Admission: EM | Admit: 2019-06-19 | Discharge: 2019-06-24 | DRG: 871 | Disposition: A | Discharge status: Home Health | Payer: Medicare HMO | Attending: Internal Medicine | Admitting: Internal Medicine

## 2019-06-19 ENCOUNTER — Emergency Department: Payer: Medicare HMO

## 2019-06-19 ENCOUNTER — Other Ambulatory Visit: Payer: Self-pay

## 2019-06-19 DIAGNOSIS — M810 Age-related osteoporosis without current pathological fracture: Secondary | ICD-10-CM | POA: Diagnosis present

## 2019-06-19 DIAGNOSIS — F17210 Nicotine dependence, cigarettes, uncomplicated: Secondary | ICD-10-CM | POA: Diagnosis present

## 2019-06-19 DIAGNOSIS — Z79899 Other long term (current) drug therapy: Secondary | ICD-10-CM

## 2019-06-19 DIAGNOSIS — J9620 Acute and chronic respiratory failure, unspecified whether with hypoxia or hypercapnia: Secondary | ICD-10-CM | POA: Diagnosis not present

## 2019-06-19 DIAGNOSIS — E785 Hyperlipidemia, unspecified: Secondary | ICD-10-CM | POA: Diagnosis present

## 2019-06-19 DIAGNOSIS — D539 Nutritional anemia, unspecified: Secondary | ICD-10-CM | POA: Diagnosis present

## 2019-06-19 DIAGNOSIS — J441 Chronic obstructive pulmonary disease with (acute) exacerbation: Secondary | ICD-10-CM | POA: Diagnosis not present

## 2019-06-19 DIAGNOSIS — J449 Chronic obstructive pulmonary disease, unspecified: Secondary | ICD-10-CM | POA: Diagnosis not present

## 2019-06-19 DIAGNOSIS — R0682 Tachypnea, not elsewhere classified: Secondary | ICD-10-CM

## 2019-06-19 DIAGNOSIS — I2510 Atherosclerotic heart disease of native coronary artery without angina pectoris: Secondary | ICD-10-CM | POA: Diagnosis present

## 2019-06-19 DIAGNOSIS — Z90710 Acquired absence of both cervix and uterus: Secondary | ICD-10-CM

## 2019-06-19 DIAGNOSIS — K219 Gastro-esophageal reflux disease without esophagitis: Secondary | ICD-10-CM | POA: Diagnosis present

## 2019-06-19 DIAGNOSIS — J9622 Acute and chronic respiratory failure with hypercapnia: Secondary | ICD-10-CM | POA: Diagnosis not present

## 2019-06-19 DIAGNOSIS — I10 Essential (primary) hypertension: Secondary | ICD-10-CM | POA: Diagnosis not present

## 2019-06-19 DIAGNOSIS — E43 Unspecified severe protein-calorie malnutrition: Secondary | ICD-10-CM | POA: Diagnosis not present

## 2019-06-19 DIAGNOSIS — J189 Pneumonia, unspecified organism: Secondary | ICD-10-CM

## 2019-06-19 DIAGNOSIS — J9621 Acute and chronic respiratory failure with hypoxia: Secondary | ICD-10-CM | POA: Diagnosis not present

## 2019-06-19 DIAGNOSIS — Z66 Do not resuscitate: Secondary | ICD-10-CM | POA: Diagnosis not present

## 2019-06-19 DIAGNOSIS — H9190 Unspecified hearing loss, unspecified ear: Secondary | ICD-10-CM | POA: Diagnosis present

## 2019-06-19 DIAGNOSIS — Z9841 Cataract extraction status, right eye: Secondary | ICD-10-CM

## 2019-06-19 DIAGNOSIS — R64 Cachexia: Secondary | ICD-10-CM | POA: Diagnosis present

## 2019-06-19 DIAGNOSIS — F419 Anxiety disorder, unspecified: Secondary | ICD-10-CM | POA: Diagnosis present

## 2019-06-19 DIAGNOSIS — Z9851 Tubal ligation status: Secondary | ICD-10-CM

## 2019-06-19 DIAGNOSIS — Z681 Body mass index (BMI) 19 or less, adult: Secondary | ICD-10-CM | POA: Diagnosis not present

## 2019-06-19 DIAGNOSIS — J440 Chronic obstructive pulmonary disease with acute lower respiratory infection: Secondary | ICD-10-CM | POA: Diagnosis not present

## 2019-06-19 DIAGNOSIS — Z7982 Long term (current) use of aspirin: Secondary | ICD-10-CM

## 2019-06-19 DIAGNOSIS — J810 Acute pulmonary edema: Secondary | ICD-10-CM | POA: Diagnosis not present

## 2019-06-19 DIAGNOSIS — Z515 Encounter for palliative care: Secondary | ICD-10-CM | POA: Diagnosis not present

## 2019-06-19 DIAGNOSIS — Z9981 Dependence on supplemental oxygen: Secondary | ICD-10-CM

## 2019-06-19 DIAGNOSIS — Z885 Allergy status to narcotic agent status: Secondary | ICD-10-CM

## 2019-06-19 DIAGNOSIS — Z825 Family history of asthma and other chronic lower respiratory diseases: Secondary | ICD-10-CM

## 2019-06-19 DIAGNOSIS — Z20828 Contact with and (suspected) exposure to other viral communicable diseases: Secondary | ICD-10-CM | POA: Diagnosis present

## 2019-06-19 DIAGNOSIS — F17200 Nicotine dependence, unspecified, uncomplicated: Secondary | ICD-10-CM | POA: Diagnosis not present

## 2019-06-19 DIAGNOSIS — Z961 Presence of intraocular lens: Secondary | ICD-10-CM | POA: Diagnosis present

## 2019-06-19 DIAGNOSIS — Z7951 Long term (current) use of inhaled steroids: Secondary | ICD-10-CM

## 2019-06-19 DIAGNOSIS — G9340 Encephalopathy, unspecified: Secondary | ICD-10-CM | POA: Diagnosis present

## 2019-06-19 DIAGNOSIS — A419 Sepsis, unspecified organism: Principal | ICD-10-CM | POA: Diagnosis present

## 2019-06-19 DIAGNOSIS — R0602 Shortness of breath: Secondary | ICD-10-CM | POA: Diagnosis not present

## 2019-06-19 DIAGNOSIS — I340 Nonrheumatic mitral (valve) insufficiency: Secondary | ICD-10-CM | POA: Diagnosis not present

## 2019-06-19 DIAGNOSIS — I5032 Chronic diastolic (congestive) heart failure: Secondary | ICD-10-CM | POA: Diagnosis present

## 2019-06-19 DIAGNOSIS — Z8349 Family history of other endocrine, nutritional and metabolic diseases: Secondary | ICD-10-CM

## 2019-06-19 DIAGNOSIS — Z8249 Family history of ischemic heart disease and other diseases of the circulatory system: Secondary | ICD-10-CM

## 2019-06-19 DIAGNOSIS — J9690 Respiratory failure, unspecified, unspecified whether with hypoxia or hypercapnia: Secondary | ICD-10-CM | POA: Diagnosis not present

## 2019-06-19 DIAGNOSIS — Z9842 Cataract extraction status, left eye: Secondary | ICD-10-CM

## 2019-06-19 DIAGNOSIS — F329 Major depressive disorder, single episode, unspecified: Secondary | ICD-10-CM | POA: Diagnosis present

## 2019-06-19 DIAGNOSIS — I110 Hypertensive heart disease with heart failure: Secondary | ICD-10-CM | POA: Diagnosis present

## 2019-06-19 DIAGNOSIS — Z8709 Personal history of other diseases of the respiratory system: Secondary | ICD-10-CM | POA: Diagnosis not present

## 2019-06-19 DIAGNOSIS — Z7189 Other specified counseling: Secondary | ICD-10-CM

## 2019-06-19 DIAGNOSIS — R52 Pain, unspecified: Secondary | ICD-10-CM | POA: Diagnosis not present

## 2019-06-19 DIAGNOSIS — M419 Scoliosis, unspecified: Secondary | ICD-10-CM | POA: Diagnosis present

## 2019-06-19 DIAGNOSIS — J90 Pleural effusion, not elsewhere classified: Secondary | ICD-10-CM | POA: Diagnosis not present

## 2019-06-19 DIAGNOSIS — R069 Unspecified abnormalities of breathing: Secondary | ICD-10-CM | POA: Diagnosis not present

## 2019-06-19 LAB — CBC WITH DIFFERENTIAL/PLATELET
Abs Immature Granulocytes: 0.04 10*3/uL (ref 0.00–0.07)
Basophils Absolute: 0 10*3/uL (ref 0.0–0.1)
Basophils Relative: 0 %
Eosinophils Absolute: 0.1 10*3/uL (ref 0.0–0.5)
Eosinophils Relative: 1 %
HCT: 31.2 % — ABNORMAL LOW (ref 36.0–46.0)
Hemoglobin: 9.6 g/dL — ABNORMAL LOW (ref 12.0–15.0)
Immature Granulocytes: 0 %
Lymphocytes Relative: 15 %
Lymphs Abs: 1.3 10*3/uL (ref 0.7–4.0)
MCH: 31.8 pg (ref 26.0–34.0)
MCHC: 30.8 g/dL (ref 30.0–36.0)
MCV: 103.3 fL — ABNORMAL HIGH (ref 80.0–100.0)
Monocytes Absolute: 0.8 10*3/uL (ref 0.1–1.0)
Monocytes Relative: 9 %
Neutro Abs: 6.7 10*3/uL (ref 1.7–7.7)
Neutrophils Relative %: 75 %
Platelets: 302 10*3/uL (ref 150–400)
RBC: 3.02 MIL/uL — ABNORMAL LOW (ref 3.87–5.11)
RDW: 12 % (ref 11.5–15.5)
WBC: 9.1 10*3/uL (ref 4.0–10.5)
nRBC: 0 % (ref 0.0–0.2)

## 2019-06-19 LAB — COMPREHENSIVE METABOLIC PANEL
ALT: 14 U/L (ref 0–44)
AST: 17 U/L (ref 15–41)
Albumin: 3.5 g/dL (ref 3.5–5.0)
Alkaline Phosphatase: 96 U/L (ref 38–126)
Anion gap: 7 (ref 5–15)
BUN: 9 mg/dL (ref 8–23)
CO2: 48 mmol/L — ABNORMAL HIGH (ref 22–32)
Calcium: 9.1 mg/dL (ref 8.9–10.3)
Chloride: 81 mmol/L — ABNORMAL LOW (ref 98–111)
Creatinine, Ser: 0.4 mg/dL — ABNORMAL LOW (ref 0.44–1.00)
GFR calc Af Amer: >60 mL/min (ref >60)
GFR calc non Af Amer: >60 mL/min (ref >60)
Glucose, Bld: 110 mg/dL — ABNORMAL HIGH (ref 70–99)
Potassium: 3.7 mmol/L (ref 3.5–5.1)
Sodium: 136 mmol/L (ref 135–145)
Total Bilirubin: 0.4 mg/dL (ref 0.3–1.2)
Total Protein: 6.5 g/dL (ref 6.5–8.1)

## 2019-06-19 LAB — PROCALCITONIN: Procalcitonin: <0.1 ng/mL

## 2019-06-19 LAB — LIPASE, BLOOD: Lipase: 36 U/L (ref 11–51)

## 2019-06-19 LAB — LACTIC ACID, PLASMA: Lactic Acid, Venous: 0.7 mmol/L (ref 0.5–1.9)

## 2019-06-19 LAB — SARS CORONAVIRUS 2 BY RT PCR (HOSPITAL ORDER, PERFORMED IN ~~LOC~~ HOSPITAL LAB): SARS Coronavirus 2: NEGATIVE

## 2019-06-19 LAB — TROPONIN I (HIGH SENSITIVITY): Troponin I (High Sensitivity): 19 ng/L — ABNORMAL HIGH (ref <18)

## 2019-06-19 MED ORDER — ONDANSETRON HCL 4 MG/2ML IJ SOLN: 4.0000 mg | Freq: Four times a day (QID) | INTRAMUSCULAR | Status: DC | PRN

## 2019-06-19 MED ORDER — IPRATROPIUM-ALBUTEROL 0.5-2.5 (3) MG/3ML IN SOLN
3.0000 mL | Freq: Four times a day (QID) | RESPIRATORY_TRACT | Status: DC
  Administered 2019-06-20 – 2019-06-21 (×6): 3 mL via RESPIRATORY_TRACT
  Filled 2019-06-19 (×7): qty 3

## 2019-06-19 MED ORDER — AZITHROMYCIN 250 MG PO TABS
250.0000 mg | ORAL_TABLET | Freq: Every day | ORAL | Status: DC
  Administered 2019-06-20: 250 mg via ORAL
  Filled 2019-06-19 (×2): qty 1

## 2019-06-19 MED ORDER — AMLODIPINE BESYLATE 10 MG PO TABS
10.0000 mg | ORAL_TABLET | Freq: Every day | ORAL | Status: DC
  Administered 2019-06-20: 09:00:00 10 mg via ORAL
  Filled 2019-06-19: qty 2

## 2019-06-19 MED ORDER — BUDESONIDE 0.5 MG/2ML IN SUSP
0.5000 mg | Freq: Two times a day (BID) | RESPIRATORY_TRACT | Status: DC
  Administered 2019-06-20 – 2019-06-21 (×3): 0.5 mg via RESPIRATORY_TRACT
  Filled 2019-06-19 (×3): qty 2

## 2019-06-19 MED ORDER — SODIUM CHLORIDE 0.9 % IV SOLN
INTRAVENOUS | Status: DC
  Administered 2019-06-19 – 2019-06-21 (×2): via INTRAVENOUS

## 2019-06-19 MED ORDER — ACETAMINOPHEN 325 MG PO TABS
650.0000 mg | ORAL_TABLET | Freq: Four times a day (QID) | ORAL | Status: DC | PRN
  Administered 2019-06-22 – 2019-06-24 (×4): 650 mg via ORAL
  Filled 2019-06-19 (×4): qty 2

## 2019-06-19 MED ORDER — LORATADINE 10 MG PO TABS
10.0000 mg | ORAL_TABLET | Freq: Every day | ORAL | Status: DC
  Administered 2019-06-19 – 2019-06-20 (×2): 10 mg via ORAL
  Filled 2019-06-19 (×2): qty 1

## 2019-06-19 MED ORDER — IPRATROPIUM-ALBUTEROL 0.5-2.5 (3) MG/3ML IN SOLN
3.0000 mL | Freq: Once | RESPIRATORY_TRACT | Status: AC
  Administered 2019-06-19: 3 mL via RESPIRATORY_TRACT
  Filled 2019-06-19: qty 3

## 2019-06-19 MED ORDER — VITAMIN E 180 MG (400 UNIT) PO CAPS
400.0000 [IU] | ORAL_CAPSULE | Freq: Every day | ORAL | Status: DC
  Administered 2019-06-19 – 2019-06-20 (×2): 400 [IU] via ORAL
  Filled 2019-06-19 (×2): qty 1

## 2019-06-19 MED ORDER — TRAZODONE HCL 50 MG PO TABS: 25.0000 mg | ORAL_TABLET | Freq: Every evening | ORAL | Status: DC | PRN

## 2019-06-19 MED ORDER — METHYLPREDNISOLONE SODIUM SUCC 40 MG IJ SOLR
40.0000 mg | Freq: Two times a day (BID) | INTRAMUSCULAR | Status: DC
  Administered 2019-06-20: 05:00:00 40 mg via INTRAVENOUS
  Filled 2019-06-19: qty 1

## 2019-06-19 MED ORDER — VITAMIN D3 25 MCG (1000 UNIT) PO TABS
2000.0000 [IU] | ORAL_TABLET | Freq: Every day | ORAL | Status: DC
  Administered 2019-06-19 – 2019-06-23 (×5): 2000 [IU] via ORAL
  Filled 2019-06-19 (×11): qty 2

## 2019-06-19 MED ORDER — SALINE SPRAY 0.65 % NA SOLN
1.0000 | NASAL | Status: DC | PRN
  Administered 2019-06-20 (×2): 1 via NASAL
  Filled 2019-06-19 (×2): qty 44

## 2019-06-19 MED ORDER — HYDROCODONE-ACETAMINOPHEN 5-325 MG PO TABS: 1.0000 | ORAL_TABLET | Freq: Two times a day (BID) | ORAL | Status: DC | PRN

## 2019-06-19 MED ORDER — ONDANSETRON HCL 4 MG PO TABS: 4.0000 mg | ORAL_TABLET | Freq: Four times a day (QID) | ORAL | Status: DC | PRN

## 2019-06-19 MED ORDER — ACETAMINOPHEN 650 MG RE SUPP: 650.0000 mg | Freq: Four times a day (QID) | RECTAL | Status: DC | PRN

## 2019-06-19 MED ORDER — ADULT MULTIVITAMIN W/MINERALS CH
1.0000 | ORAL_TABLET | Freq: Every day | ORAL | Status: DC
  Administered 2019-06-19 – 2019-06-23 (×5): 1 via ORAL
  Filled 2019-06-19 (×5): qty 1

## 2019-06-19 MED ORDER — SODIUM CHLORIDE 0.9 % IV SOLN
2.0000 g | INTRAVENOUS | Status: DC
  Administered 2019-06-20: 2 g via INTRAVENOUS
  Filled 2019-06-19: qty 2
  Filled 2019-06-19: qty 20

## 2019-06-19 MED ORDER — SERTRALINE HCL 50 MG PO TABS
100.0000 mg | ORAL_TABLET | Freq: Every day | ORAL | Status: DC
  Administered 2019-06-20: 100 mg via ORAL
  Filled 2019-06-19: qty 2

## 2019-06-19 MED ORDER — BISACODYL 5 MG PO TBEC: 5.0000 mg | DELAYED_RELEASE_TABLET | Freq: Every day | ORAL | Status: DC | PRN

## 2019-06-19 MED ORDER — ATORVASTATIN CALCIUM 10 MG PO TABS
10.0000 mg | ORAL_TABLET | Freq: Every day | ORAL | Status: DC
  Administered 2019-06-19 – 2019-06-20 (×2): 10 mg via ORAL
  Filled 2019-06-19 (×2): qty 1

## 2019-06-19 MED ORDER — AZITHROMYCIN 500 MG PO TABS
500.0000 mg | ORAL_TABLET | Freq: Once | ORAL | Status: AC
  Administered 2019-06-19: 500 mg via ORAL
  Filled 2019-06-19: qty 1

## 2019-06-19 MED ORDER — METHYLPREDNISOLONE SODIUM SUCC 125 MG IJ SOLR
80.0000 mg | INTRAMUSCULAR | Status: AC
  Administered 2019-06-19: 80 mg via INTRAVENOUS
  Filled 2019-06-19: qty 2

## 2019-06-19 MED ORDER — IPRATROPIUM BROMIDE HFA 17 MCG/ACT IN AERS: 2.0000 | INHALATION_SPRAY | Freq: Once | RESPIRATORY_TRACT | Status: DC

## 2019-06-19 MED ORDER — FERROUS SULFATE 325 (65 FE) MG PO TABS
325.0000 mg | ORAL_TABLET | Freq: Every day | ORAL | Status: DC
  Administered 2019-06-19 – 2019-06-23 (×5): 325 mg via ORAL
  Filled 2019-06-19 (×5): qty 1

## 2019-06-19 MED ORDER — CALCIUM CARBONATE 1500 (600 CA) MG PO TABS
600.0000 mg | ORAL_TABLET | Freq: Two times a day (BID) | ORAL | Status: DC
  Filled 2019-06-19 (×2): qty 1

## 2019-06-19 MED ORDER — SODIUM CHLORIDE 0.9 % IV SOLN
2.0000 g | Freq: Once | INTRAVENOUS | Status: AC
  Administered 2019-06-19: 2 g via INTRAVENOUS
  Filled 2019-06-19: qty 20

## 2019-06-19 MED ORDER — ENOXAPARIN SODIUM 40 MG/0.4ML ~~LOC~~ SOLN
40.0000 mg | SUBCUTANEOUS | Status: DC
  Administered 2019-06-19 – 2019-06-20 (×2): 40 mg via SUBCUTANEOUS
  Filled 2019-06-19 (×2): qty 0.4

## 2019-06-19 MED ORDER — SODIUM CHLORIDE 0.9 % IV BOLUS
500.0000 mL | Freq: Once | INTRAVENOUS | Status: AC
  Administered 2019-06-19: 500 mL via INTRAVENOUS

## 2019-06-19 MED ORDER — ASPIRIN 81 MG PO CHEW
81.0000 mg | CHEWABLE_TABLET | Freq: Every day | ORAL | Status: DC
  Administered 2019-06-19 – 2019-06-23 (×5): 81 mg via ORAL
  Filled 2019-06-19 (×5): qty 1

## 2019-06-19 MED ORDER — ALBUTEROL SULFATE HFA 108 (90 BASE) MCG/ACT IN AERS: 2.0000 | INHALATION_SPRAY | RESPIRATORY_TRACT | Status: DC | PRN

## 2019-06-19 NOTE — ED Notes (Signed)
ED TO INPATIENT HANDOFF REPORT  ED Nurse Name and Phone #: [phone]  S Name/Age/Gender Hailey Gibbs 73 y.o. female Room/Bed: ED01A/ED01A  Code Status   Code Status: Full Code  Home/SNF/Other Home Patient oriented to: self, place, time and situation Is this baseline? Yes   Triage Complete: Triage complete  Chief Complaint abd pain ems  Triage Note PT arrives via [facility] from home for right sided abdominal pain x 6 weeks. Pt has hx of gallstones. PT reports no appetite x 3 days. Pt reports SOB started today, pt wears O2 at home on 2L and has hx of COPD. Pt A&Ox4 and in NAD at this time. PT reports no pain at this time   Allergies Allergies  Allergen Reactions  . Codeine Anxiety         Level of Care/Admitting Diagnosis ED Disposition    ED Disposition Condition Comment   Admit  Hospital Area: [facility] [[id number]]  Level of Care: Med-Surg [16]  Covid Evaluation: Confirmed COVID Negative  Diagnosis: COPD exacerbation [facility]) [[id number]]  Admitting Physician: Alford Highland [[id number]]  Attending Physician: Alford Highland [phone]  Estimated length of stay: past midnight tomorrow  Certification:: I certify this patient will need inpatient services for at least 2 midnights  PT Class (Do Not Modify): Inpatient [101]  PT Acc Code (Do Not Modify): Private [1]       B Medical/Surgery History Past Medical History:  Diagnosis Date  . Abnormal chest sounds 08/29/2015  . Acute respiratory failure (HCC)   . Anemia   . Anxiety   . BP (high blood pressure) 08/29/2015  . Carotid artery occlusion   . Carotid stenosis 06/23/2015  . COPD (chronic obstructive pulmonary disease) (HCC)   . COPD exacerbation (HCC) 10/26/2016  . Coronary artery disease   . Depression   . Dysrhythmia   . GERD (gastroesophageal reflux disease)   . History of orthopnea   . HOH (hard of hearing)   . Hyperlipidemia   . Hypertension   . Obstructive chronic bronchitis with  exacerbation (HCC) 08/29/2015  . Osteoporosis   . Oxygen dependent    2L/MIN CONTINUOUS  . Palliative care by specialist   . Palpitations   . Seasonal allergies   . Shortness of breath dyspnea    Past Surgical History:  Procedure Laterality Date  . CATARACT EXTRACTION W/PHACO Left 11/04/2018   Procedure: CATARACT EXTRACTION PHACO AND INTRAOCULAR LENS PLACEMENT (IOC);  Surgeon: Galen Manila, MD;  Location: [facility] ORS;  Service: Ophthalmology;  Laterality: Left;  [place] 00:49.1 CDE 6.32 Fluid Pack Lot # [id number]  . CATARACT EXTRACTION W/PHACO Right 11/25/2018   Procedure: CATARACT EXTRACTION PHACO AND INTRAOCULAR LENS PLACEMENT (IOC) RIGHT;  Surgeon: Galen Manila, MD;  Location: [facility] ORS;  Service: Ophthalmology;  Laterality: Right;  [place]   00:51 CDE  8.93 Fluid pack lot #[id number]  . CATARACT EXTRACTION, BILATERAL Bilateral   . ENDARTERECTOMY Left 06/23/2015   Procedure: ENDARTERECTOMY CAROTID;  Surgeon: Annice Needy, MD;  Location: [facility] ORS;  Service: Vascular;  Laterality: Left;  . TUBAL LIGATION    . VAGINAL HYSTERECTOMY       A IV Location/Drains/Wounds Patient Lines/Drains/Airways Status   Active Line/Drains/Airways    Name:   Placement date:   Placement time:   Site:   Days:   Peripheral IV 06/19/19 Right Arm   06/19/19    1710    Arm   less than 1          Intake/Output Last  24 hours No intake or output data in the 24 hours ending 06/19/19 1939  Labs/Imaging Results for orders placed or performed during the hospital encounter of 06/19/19 (from the past 48 hour(s))  SARS Coronavirus 2 (CEPHEID- Performed in [facility] hospital lab), Hosp Order     Status: None   Collection Time: 06/19/19  5:18 PM   Specimen: Nasopharyngeal Swab  Result Value Ref Range   SARS Coronavirus 2 NEGATIVE NEGATIVE    Comment: (NOTE) If result is NEGATIVE SARS-CoV-2 target nucleic acids are NOT DETECTED. The SARS-CoV-2 RNA is generally detectable in upper and lower  respiratory specimens  during the acute phase of infection. The lowest  concentration of SARS-CoV-2 viral copies this assay can detect is 250  copies / mL. A negative result does not preclude SARS-CoV-2 infection  and should not be used as the sole basis for treatment or other  patient management decisions.  A negative result may occur with  improper specimen collection / handling, submission of specimen other  than nasopharyngeal swab, presence of viral mutation(s) within the  areas targeted by this assay, and inadequate number of viral copies  (<250 copies / mL). A negative result must be combined with clinical  observations, patient history, and epidemiological information. If result is POSITIVE SARS-CoV-2 target nucleic acids are DETECTED. The SARS-CoV-2 RNA is generally detectable in upper and lower  respiratory specimens dur ing the acute phase of infection.  Positive  results are indicative of active infection with SARS-CoV-2.  Clinical  correlation with patient history and other diagnostic information is  necessary to determine patient infection status.  Positive results do  not rule out bacterial infection or co-infection with other viruses. If result is PRESUMPTIVE POSTIVE SARS-CoV-2 nucleic acids MAY BE PRESENT.   A presumptive positive result was obtained on the submitted specimen  and confirmed on repeat testing.  While 2019 novel coronavirus  (SARS-CoV-2) nucleic acids may be present in the submitted sample  additional confirmatory testing may be necessary for epidemiological  and / or clinical management purposes  to differentiate between  SARS-CoV-2 and other Sarbecovirus currently known to infect humans.  If clinically indicated additional testing with an alternate test  methodology [phone]([id number]) is advised. The SARS-CoV-2 RNA is generally  detectable in upper and lower respiratory sp ecimens during the acute  phase of infection. The expected result is Negative. Fact Sheet for Patients:   BoilerBrush.com.cyhttps://www.fda.gov/media/136312/download Fact Sheet for Healthcare Providers: https://pope.com/https://www.fda.gov/media/136313/download This test is not yet approved or cleared by the [place] FDA and has been authorized for detection and/or diagnosis of SARS-CoV-2 by FDA under an Emergency Use Authorization (EUA).  This EUA will remain in effect (meaning this test can be used) for the duration of the COVID-19 declaration under Section 564(b)(1) of the Act, 21 U.S.C. section 360bbb-3(b)(1), unless the authorization is terminated or revoked sooner. Performed at [facility], [address] [place], [place] [address]   CBC with Differential     Status: Abnormal   Collection Time: 06/19/19  5:18 PM  Result Value Ref Range   WBC 9.1 4.0 - 10.5 K/uL   RBC 3.02 (L) 3.87 - 5.11 MIL/uL   Hemoglobin 9.6 (L) 12.0 - 15.0 g/dL   HCT 81.131.2 (L) 91.436.0 - 78.246.0 %   MCV 103.3 (H) 80.0 - 100.0 fL   MCH 31.8 26.0 - 34.0 pg   MCHC 30.8 30.0 - 36.0 g/dL   RDW 95.612.0 21.311.5 - 08.615.5 %   Platelets 302 150 - 400  K/uL   nRBC 0.0 0.0 - 0.2 %   Neutrophils Relative % 75 %   Neutro Abs 6.7 1.7 - 7.7 K/uL   Lymphocytes Relative 15 %   Lymphs Abs 1.3 0.7 - 4.0 K/uL   Monocytes Relative 9 %   Monocytes Absolute 0.8 0.1 - 1.0 K/uL   Eosinophils Relative 1 %   Eosinophils Absolute 0.1 0.0 - 0.5 K/uL   Basophils Relative 0 %   Basophils Absolute 0.0 0.0 - 0.1 K/uL   Immature Granulocytes 0 %   Abs Immature Granulocytes 0.04 0.00 - 0.07 K/uL    Comment: Performed at [facility], [address] [place], [place] [address]  Comprehensive metabolic panel     Status: Abnormal   Collection Time: 06/19/19  5:18 PM  Result Value Ref Range   Sodium 136 135 - 145 mmol/L   Potassium 3.7 3.5 - 5.1 mmol/L   Chloride 81 (L) 98 - 111 mmol/L   CO2 48 (H) 22 - 32 mmol/L   Glucose, Bld 110 (H) 70 - 99 mg/dL   BUN 9 8 - 23 mg/dL   Creatinine, Ser 0.40 (L) 0.44 - 1.00 mg/dL   Calcium 9.1 8.9 - 10.3 mg/dL   Total  Protein 6.5 6.5 - 8.1 g/dL   Albumin 3.5 3.5 - 5.0 g/dL   AST 17 15 - 41 U/L   ALT 14 0 - 44 U/L   Alkaline Phosphatase 96 38 - 126 U/L   Total Bilirubin 0.4 0.3 - 1.2 mg/dL   GFR calc non Af Amer >60 >60 mL/min   GFR calc Af Amer >60 >60 mL/min   Anion gap 7 5 - 15    Comment: Performed at [facility], [address] [place], [place] [address]  Lipase, blood     Status: None   Collection Time: 06/19/19  5:18 PM  Result Value Ref Range   Lipase 36 11 - 51 U/L    Comment: Performed at [facility], [place], [place] [address]  Troponin I (High Sensitivity)     Status: Abnormal   Collection Time: 06/19/19  5:18 PM  Result Value Ref Range   Troponin I (High Sensitivity) 19 (H) <18 ng/L    Comment: (NOTE) Elevated high sensitivity troponin I (hsTnI) values and significant  changes across serial measurements may suggest ACS but many other  chronic and acute conditions are known to elevate hsTnI results.  Refer to the "Links" section for chest pain algorithms and additional  guidance. Performed at [facility], [address] [place], [place] [address]   Lactic acid, plasma     Status: None   Collection Time: 06/19/19  6:07 PM  Result Value Ref Range   Lactic Acid, Venous 0.7 0.5 - 1.9 mmol/L    Comment: Performed at [facility], [address] [place], [place] [address]   Dg Chest Port 1 View  Result Date: 06/19/2019 CLINICAL DATA:  Shortness of breath. EXAM: PORTABLE CHEST 1 VIEW COMPARISON:  December 08, 2018 FINDINGS: Cardiomediastinal silhouette is enlarged. Calcific atherosclerotic disease of the aorta and tortuosity. Mediastinal contours appear intact. There is no evidence of pleural effusion or pneumothorax. Patchy areas of linear airspace consolidation in the right upper, right middle and bilateral lower lung fields. Osseous structures are without acute abnormality. Soft tissues are grossly normal. IMPRESSION: Patchy  areas of linear airspace consolidation in the right upper, right middle and bilateral lower lung fields. These may represent  multifocal pneumonia or atelectatic changes. Electronically Signed   By: Ted Mcalpineobrinka  Dimitrova M.D.   On: 06/19/2019 17:47    Pending Labs Unresulted Labs (From admission, onward)    Start     Ordered   06/26/19 0500  Creatinine, serum  (enoxaparin (LOVENOX)    CrCl >/= 30 ml/min)  Weekly,   STAT    Comments: while on enoxaparin therapy    06/19/19 1904   06/20/19 0500  Basic metabolic panel  Tomorrow morning,   STAT     06/19/19 1904   06/20/19 0500  CBC  Tomorrow morning,   STAT     06/19/19 1904   06/19/19 1808  Procalcitonin  Add-on,   AD     06/19/19 1808   06/19/19 1756  Blood Culture (routine x 2)  BLOOD CULTURE X 2,   STAT     06/19/19 1756          Vitals/Pain Today's Vitals   06/19/19 1707 06/19/19 1709  BP:  (!) 170/67  Pulse:  (!) 108  Temp:  98.7 F (37.1 C)  TempSrc:  Oral  SpO2:  99%  Weight: 45.4 kg   Height: 5\' 2"  (1.575 m)   PainSc: 0-No pain     Isolation Precautions No active isolations  Medications Medications  methylPREDNISolone sodium succinate (SOLU-MEDROL) 40 mg/mL injection 40 mg (has no administration in time range)  ipratropium-albuterol (DUONEB) 0.5-2.5 (3) MG/3ML nebulizer solution 3 mL (has no administration in time range)  budesonide (PULMICORT) nebulizer solution 0.5 mg (has no administration in time range)  cefTRIAXone (ROCEPHIN) 2 g in sodium chloride 0.9 % 100 mL IVPB (has no administration in time range)  azithromycin (ZITHROMAX) tablet 250 mg (has no administration in time range)  enoxaparin (LOVENOX) injection 40 mg (has no administration in time range)  acetaminophen (TYLENOL) tablet 650 mg (has no administration in time range)    Or  acetaminophen (TYLENOL) suppository 650 mg (has no administration in time range)  ondansetron (ZOFRAN) tablet 4 mg (has no administration in time range)    Or  ondansetron  (ZOFRAN) injection 4 mg (has no administration in time range)  aspirin chewable tablet 81 mg (has no administration in time range)  HYDROcodone-acetaminophen (NORCO/VICODIN) 5-325 MG per tablet 1 tablet (has no administration in time range)  amLODipine (NORVASC) tablet 10 mg (has no administration in time range)  atorvastatin (LIPITOR) tablet 10 mg (has no administration in time range)  sertraline (ZOLOFT) tablet 100 mg (has no administration in time range)  traZODone (DESYREL) tablet 25 mg (has no administration in time range)  bisacodyl (DULCOLAX) EC tablet 5 mg (has no administration in time range)  ferrous sulfate tablet 325 mg (has no administration in time range)  calcium carbonate (OSCAL) tablet 1,500 mg (has no administration in time range)  Vitamin D3 TABS 2,000 Units (has no administration in time range)  multivitamin tablet 1 tablet (has no administration in time range)  vitamin E capsule 400 Units (has no administration in time range)  loratadine (CLARITIN) tablet 10 mg (has no administration in time range)  sodium chloride (OCEAN) 0.65 % nasal spray 1 spray (has no administration in time range)  0.9 %  sodium chloride infusion (has no administration in time range)  methylPREDNISolone sodium succinate (SOLU-MEDROL) 125 mg/2 mL injection 80 mg (80 mg Intravenous Given 06/19/19 1731)  sodium chloride 0.9 % bolus 500 mL (500 mLs Intravenous New Bag/Given 06/19/19 1731)  cefTRIAXone (ROCEPHIN) 2 g in sodium chloride 0.9 % 100  mL IVPB (2 g Intravenous New Bag/Given 06/19/19 1823)  azithromycin (ZITHROMAX) tablet 500 mg (500 mg Oral Given 06/19/19 1822)  ipratropium-albuterol (DUONEB) 0.5-2.5 (3) MG/3ML nebulizer solution 3 mL (3 mLs Nebulization Given 06/19/19 1907)  ipratropium-albuterol (DUONEB) 0.5-2.5 (3) MG/3ML nebulizer solution 3 mL (3 mLs Nebulization Given 06/19/19 1907)    Mobility walks with person assist High fall risk   Focused Assessments Pulmonary Assessment  Handoff:  Lung sounds:   O2 Device: Nasal Cannula O2 Flow Rate (L/min): 2 L/min      R Recommendations: See Admitting Provider Note  Report given to:   Additional Notes: pt has lost more than 30 pounds due to decreased PO intake. Pt reporting she has not eaten anything in three days.

## 2019-06-19 NOTE — ED Provider Notes (Signed)
Reevaluation Mentation improved.  Work of breathing improving.  Reports her shortness of breath is starting to improve.  She is resting comfortably at this time, still some accessory muscle use but appears improved.   Delman Kitten, MD 06/19/19 1945

## 2019-06-19 NOTE — ED Provider Notes (Signed)
Patient meets sepsis criteria with tachycardia and tachypnea.  Chest x-ray reviewed by me, concerning for multifocal pneumonia.  She does report increased dyspnea.  She continues to have accessory muscle use and pharmacy had not yet sent her inhalers, I will let the patient use 3 puffs of her home albuterol at this time and nurses verifying the pharmacy will be expediting further inhaler treatments.  COVID test is pending.  She is alert, oriented, ongoing mild to moderate increased work of breathing.  Continue to monitor closely.   Delman Kitten, MD 06/19/19 [phone]

## 2019-06-19 NOTE — H&P (Signed)
[facility] [place] [facility]   PATIENT NAME: Hailey Gibbs    MR#:  [id number]  DATE OF BIRTH:  04-29-1946  DATE OF ADMISSION:  06/19/2019  PRIMARY CARE PHYSICIAN: Chrismon, Jodell Ciproennis E, PA   REQUESTING/REFERRING PHYSICIAN:   CHIEF COMPLAINT:   Chief Complaint  Patient presents with  . Abdominal Pain    HISTORY OF PRESENT ILLNESS:  Hailey Gibbs  is a 73 y.o. female coming in feeling bad.  She thought her gallbladder was acting up.  She states her COPD is acting up.  States they diagnosed me with pneumonia here in the emergency room.  She has not been eating.  She gets sick when she does eat.  She is not feeling well.  She has had a 25 pound weight loss over 6 weeks.  She is feeling tired.  She is coughing up greenish phlegm and she is short of breath.  She is wheezing.  Hospitalist services were contacted for further evaluation.  PAST MEDICAL HISTORY:   Past Medical History:  Diagnosis Date  . Abnormal chest sounds 08/29/2015  . Acute respiratory failure (HCC)   . Anemia   . Anxiety   . BP (high blood pressure) 08/29/2015  . Carotid artery occlusion   . Carotid stenosis 06/23/2015  . COPD (chronic obstructive pulmonary disease) (HCC)   . COPD exacerbation (HCC) 10/26/2016  . Coronary artery disease   . Depression   . Dysrhythmia   . GERD (gastroesophageal reflux disease)   . History of orthopnea   . HOH (hard of hearing)   . Hyperlipidemia   . Hypertension   . Obstructive chronic bronchitis with exacerbation (HCC) 08/29/2015  . Osteoporosis   . Oxygen dependent    2L/MIN CONTINUOUS  . Palliative care by specialist   . Palpitations   . Seasonal allergies   . Shortness of breath dyspnea     PAST SURGICAL HISTORY:   Past Surgical History:  Procedure Laterality Date  . CATARACT EXTRACTION W/PHACO Left 11/04/2018   Procedure: CATARACT EXTRACTION PHACO AND INTRAOCULAR LENS PLACEMENT (IOC);  Surgeon: Galen ManilaPorfilio, William, MD;  Location:  [facility] ORS;  Service: Ophthalmology;  Laterality: Left;  US 00:49.1 CDE 6.32 Fluid Pack Lot # [id number]  . CATARACT EXTRACTION W/PHACO Right 11/25/2018   Procedure: CATARACT EXTRACTION PHACO AND INTRAOCULAR LENS PLACEMENT (IOC) RIGHT;  Surgeon: Galen ManilaPorfilio, William, MD;  Location: [facility] ORS;  Service: Ophthalmology;  Laterality: Right;  US   00:51 CDE  8.93 Fluid pack lot #[id number]  . CATARACT EXTRACTION, BILATERAL Bilateral   . ENDARTERECTOMY Left 06/23/2015   Procedure: ENDARTERECTOMY CAROTID;  Surgeon: Annice NeedyJason S Dew, MD;  Location: [facility] ORS;  Service: Vascular;  Laterality: Left;  . TUBAL LIGATION    . VAGINAL HYSTERECTOMY      SOCIAL HISTORY:   Social History   Tobacco Use  . Smoking status: Current Every Day Smoker    Packs/day: 1.50    Years: 45.00    Pack years: 67.50    Types: Cigarettes    Last attempt to quit: 12/08/2018    Years since quitting: 0.5  . Smokeless tobacco: Never Used  Substance Use Topics  . Alcohol use: No    Alcohol/week: 0.0 standard drinks    FAMILY HISTORY:   Family History  Problem Relation Age of Onset  . Heart disease Father 9575       CABG   . Hyperlipidemia Father   . Pneumonia Father   . Dementia Mother   . COPD Mother   .  Diabetes Sister   . Parkinson's disease Brother   . Post-traumatic stress disorder Son     DRUG ALLERGIES:   Allergies  Allergen Reactions  . Codeine Anxiety         REVIEW OF SYSTEMS:  CONSTITUTIONAL: No fever, chills or sweats.  Positive for fatigue.  Positive for weight loss 25 pounds over 6 weeks.  EYES: No blurred or double vision.  EARS, NOSE, AND THROAT: No tinnitus or ear pain. No sore throat RESPIRATORY: Positive for cough, shortness of breath, and wheezing.  No hemoptysis.  CARDIOVASCULAR: No chest pain, orthopnea, edema.  GASTROINTESTINAL: Occasional nausea, vomiting.  No abdominal pain. No blood in bowel movements.  Alternating diarrhea and constipation GENITOURINARY: No dysuria, hematuria.   ENDOCRINE: No polyuria, nocturia,  HEMATOLOGY: No anemia, easy bruising or bleeding SKIN: No rash or lesion. MUSCULOSKELETAL: No joint pain or arthritis.   NEUROLOGIC: No tingling, numbness, weakness.  PSYCHIATRY: No anxiety or depression.   MEDICATIONS AT HOME:   Prior to Admission medications   Medication Sig Start Date End Date Taking? Authorizing Provider  acetaminophen (TYLENOL) 500 MG tablet Take 1,000 mg by mouth every 6 (six) hours as needed for mild pain or headache.     [provider]  albuterol (VENTOLIN HFA) 108 (90 Base) MCG/ACT inhaler TAKE 2 PUFFS BY MOUTH EVERY 6 HOURS AS NEEDED FOR WHEEZE OR SHORTNESS OF BREATH 03/31/19   Chrismon, Jodell Ciproennis E, PA  alendronate (FOSAMAX) 70 MG tablet TAKE 1 TABLET BY MOUTH EVERY 7 DAYS. TAKE WITH A FULL GLASS OF WATER ON AN EMPTY STOMACH. 06/05/19   Chrismon, Jodell Ciproennis E, PA  amLODipine (NORVASC) 10 MG tablet TAKE 1 TABLET BY MOUTH EVERY DAY 06/01/19   Chrismon, Jodell Ciproennis E, PA  aspirin 81 MG chewable tablet Chew 81 mg by mouth at bedtime.     [provider]  atorvastatin (LIPITOR) 10 MG tablet Take 1 tablet (10 mg total) by mouth at bedtime. 06/01/19   Chrismon, Jodell Ciproennis E, PA  bisacodyl (BISACODYL) 5 MG EC tablet Take 5 mg by mouth daily as needed for moderate constipation.    [provider]  calcium carbonate (OSCAL) 1500 (600 Ca) MG TABS tablet Take 600 mg of elemental calcium by mouth 2 (two) times daily.     [provider]  cetirizine (ZYRTEC) 10 MG tablet Take 10 mg by mouth at bedtime.     [provider]  Cholecalciferol (VITAMIN D3) 2000 units TABS Take 2,000 Units by mouth at bedtime.     [provider]  ferrous sulfate 325 (65 FE) MG tablet Take 325 mg by mouth at bedtime.     [provider]  HYDROcodone-acetaminophen (NORCO/VICODIN) 5-325 MG tablet Take 1 tablet by mouth 2 (two) times daily as needed for moderate pain.    [provider]  ipratropium-albuterol  (DUONEB) 0.5-2.5 (3) MG/3ML SOLN Take 3 mLs by nebulization every 6 (six) hours as needed. 12/10/18   Houston SirenSainani, Vivek J, MD  Multiple Vitamin (MULTIVITAMIN) tablet Take 1 tablet by mouth at bedtime.     [provider]  sertraline (ZOLOFT) 100 MG tablet Take 100 mg by mouth daily.    [provider]  sodium chloride (OCEAN) 0.65 % SOLN nasal spray Place 1 spray into both nostrils as needed (dryness).    [provider]  SUPER B COMPLEX/C PO Take 1 tablet by mouth at bedtime.    [provider]  SYMBICORT 160-4.5 MCG/ACT inhaler INHALE 2 PUFFS INTO THE LUNGS 2 (  TWO) TIMES DAILY. 03/23/19   Chrismon, Vickki Muff, PA  traZODone (DESYREL) 50 MG tablet Take 0.5-1 tablets (25-50 mg total) by mouth at bedtime as needed for sleep. Patient not taking: Reported on 04/29/2019 01/08/19   Chrismon, Vickki Muff, PA  vitamin E 400 UNIT capsule Take 400 Units by mouth at bedtime.     [provider]      VITAL SIGNS:  Blood pressure (!) 170/67, pulse (!) 108, temperature 98.7 F (37.1 C), temperature source Oral, height 5\' 2"  (1.575 m), weight 45.4 kg, SpO2 99 %.  PHYSICAL EXAMINATION:  GENERAL:  73 y.o.-year-old patient lying in the bed with  acute respiratory distress.  EYES: Pupils equal, round, reactive to light and accommodation. No scleral icterus. Extraocular muscles intact.  HEENT: Head atraumatic, normocephalic. Oropharynx and nasopharynx clear.  NECK:  Supple, no jugular venous distention. No thyroid enlargement, no tenderness.  LUNGS: Decreased breath sounds bilaterally, positive inspiratory and expiratory wheezing.  Positive rhonchi at the bases.  Positive use of accessory muscles of respiration.  CARDIOVASCULAR: S1, S2 tachycardic. No murmurs, rubs, or gallops.  ABDOMEN: Soft, nontender, nondistended. Bowel sounds present. No organomegaly or mass.  EXTREMITIES: No pedal edema, cyanosis, or clubbing.  NEUROLOGIC: Cranial nerves II through XII are intact. Muscle  strength 5/5 in all extremities. Sensation intact. Gait not checked.  PSYCHIATRIC: The patient is alert and oriented x 3.  SKIN: No rash, lesion, or ulcer.   LABORATORY PANEL:   CBC Recent Labs  Lab 06/19/19 1718  WBC 9.1  HGB 9.6*  HCT 31.2*  PLT 302   ------------------------------------------------------------------------------------------------------------------  Chemistries  Recent Labs  Lab 06/19/19 1718  NA 136  K 3.7  CL 81*  CO2 48*  GLUCOSE 110*  BUN 9  CREATININE 0.40*  CALCIUM 9.1  AST 17  ALT 14  ALKPHOS 96  BILITOT 0.4   ------------------------------------------------------------------------------------------------------------------    RADIOLOGY:  Dg Chest Port 1 View  Result Date: 06/19/2019 CLINICAL DATA:  Shortness of breath. EXAM: PORTABLE CHEST 1 VIEW COMPARISON:  December 08, 2018 FINDINGS: Cardiomediastinal silhouette is enlarged. Calcific atherosclerotic disease of the aorta and tortuosity. Mediastinal contours appear intact. There is no evidence of pleural effusion or pneumothorax. Patchy areas of linear airspace consolidation in the right upper, right middle and bilateral lower lung fields. Osseous structures are without acute abnormality. Soft tissues are grossly normal. IMPRESSION: Patchy areas of linear airspace consolidation in the right upper, right middle and bilateral lower lung fields. These may represent multifocal pneumonia or atelectatic changes. Electronically Signed   By: Fidela Salisbury M.D.   On: 06/19/2019 17:47    EKG:   Sinus tachycardia 105 bpm, right atrial enlargement  IMPRESSION AND PLAN:   1.  Clinical sepsis with multifocal pneumonia, tachypnea and tachycardia.  Rocephin and Zithromax ordered.  Follow-up cultures. 2.  COPD exacerbation.  Start Solu-Medrol and give nebulizer treatments since COVID-19 testing negative. 3.  Acute on chronic respiratory failure.  Patient chronically on 2 L of oxygen 4.   Kyphoscoliosis 5.  Hypertension on Norvasc 6.  Hyperlipidemia unspecified on Lipitor 7.  Depression on Zoloft  All the records are reviewed and case discussed with ED provider. Management plans discussed with the patient, and she is in agreement.  Patient refused me calling family at this time  CODE STATUS: Full code  TOTAL TIME TAKING CARE OF THIS PATIENT: 50 minutes.    Loletha Grayer M.D on 06/19/2019 at 7:08 PM  Between 7am to 6pm - Pager - [phone]  After 6pm call admission pager [phone]  [facility]  [phone]  CC: Primary care physician; Tamsen Roershrismon, Dennis E, PA

## 2019-06-19 NOTE — ED Triage Notes (Signed)
PT arrives via [facility] from home for right sided abdominal pain x 6 weeks. Pt has hx of gallstones. PT reports no appetite x 3 days. Pt reports SOB started today, pt wears O2 at home on 2L and has hx of COPD. Pt A&Ox4 and in NAD at this time. PT reports no pain at this time

## 2019-06-19 NOTE — ED Provider Notes (Signed)
[facility] Emergency Department Provider Note   ____________________________________________   First MD Initiated Contact with Patient 06/19/19 1705     (approximate)  I have reviewed the triage vital signs and the nursing notes.   HISTORY  Chief Complaint Abdominal Pain    HPI Hailey Gibbs is a 73 y.o. female here for evaluation for shortness of breath and lightheadedness  Patient reports for about the last few days she has been feeling she is having a flare of her "COPD".  Denies fever or chills.  Denies cough.  No exposure to coronavirus no travel.  She reports that she has been feeling symptoms of COPD, on for a couple days, also she was going to see her doctor today because for over a month now she is had abdominal pain that they think could be related to her gallstones.  That pains been steady and consistent for the last several weeks, decreased appetite nausea not eating anything other than Ensure shakes and some water but reports very poor appetite.  Also getting lightheaded.  Patient here though because she reports she started having increased shortness of breath and lightheadedness today, was given a driver's after Dr. point but just did not feel well enough to be able to drive there/severe, emergency room   Past Medical History:  Diagnosis Date  . Abnormal chest sounds 08/29/2015  . Acute respiratory failure (HCC)   . Anemia   . Anxiety   . BP (high blood pressure) 08/29/2015  . Carotid artery occlusion   . Carotid stenosis 06/23/2015  . COPD (chronic obstructive pulmonary disease) (HCC)   . COPD exacerbation (HCC) 10/26/2016  . Coronary artery disease   . Depression   . Dysrhythmia   . GERD (gastroesophageal reflux disease)   . History of orthopnea   . HOH (hard of hearing)   . Hyperlipidemia   . Hypertension   . Obstructive chronic bronchitis with exacerbation (HCC) 08/29/2015  . Osteoporosis   . Oxygen dependent    2L/MIN  CONTINUOUS  . Palliative care by specialist   . Palpitations   . Seasonal allergies   . Shortness of breath dyspnea     Patient Active Problem List   Diagnosis Date Noted  . Chronic respiratory failure with hypoxia, on home O2 therapy (HCC) 04/23/2019  . Osteoporosis 09/01/2018  . Acute respiratory failure (HCC)   . Palliative care by specialist   . UTI (urinary tract infection) 01/17/2018  . Sepsis (HCC) 11/07/2016  . COPD exacerbation (HCC) 10/26/2016  . Abnormal chest sounds 08/29/2015  . Obstructive chronic bronchitis with exacerbation (HCC) 08/29/2015  . Anxiety and depression 08/29/2015  . BP (high blood pressure) 08/29/2015  . Carotid stenosis 06/23/2015  . DNR (do not resuscitate) discussion 05/30/2015  . Tobacco use 05/30/2015  . Hyperlipidemia     Past Surgical History:  Procedure Laterality Date  . CATARACT EXTRACTION W/PHACO Left 11/04/2018   Procedure: CATARACT EXTRACTION PHACO AND INTRAOCULAR LENS PLACEMENT (IOC);  Surgeon: Galen ManilaPorfilio, William, MD;  Location: [facility] ORS;  Service: Ophthalmology;  Laterality: Left;  US 00:49.1 CDE 6.32 Fluid Pack Lot # [id number]  . CATARACT EXTRACTION W/PHACO Right 11/25/2018   Procedure: CATARACT EXTRACTION PHACO AND INTRAOCULAR LENS PLACEMENT (IOC) RIGHT;  Surgeon: Galen ManilaPorfilio, William, MD;  Location: [facility] ORS;  Service: Ophthalmology;  Laterality: Right;  US   00:51 CDE  8.93 Fluid pack lot #[id number]  . CATARACT EXTRACTION, BILATERAL Bilateral   . ENDARTERECTOMY Left 06/23/2015   Procedure: ENDARTERECTOMY CAROTID;  Surgeon: Annice NeedyJason S Dew, MD;  Location: [facility] ORS;  Service: Vascular;  Laterality: Left;  . TUBAL LIGATION    . VAGINAL HYSTERECTOMY      Prior to Admission medications   Medication Sig Start Date End Date Taking? Authorizing Provider  acetaminophen (TYLENOL) 500 MG tablet Take 1,000 mg by mouth every 6 (six) hours as needed for mild pain or headache.     [provider]  albuterol (VENTOLIN HFA) 108 (90 Base)  MCG/ACT inhaler TAKE 2 PUFFS BY MOUTH EVERY 6 HOURS AS NEEDED FOR WHEEZE OR SHORTNESS OF BREATH 03/31/19   Chrismon, Jodell Ciproennis E, PA  alendronate (FOSAMAX) 70 MG tablet TAKE 1 TABLET BY MOUTH EVERY 7 DAYS. TAKE WITH A FULL GLASS OF WATER ON AN EMPTY STOMACH. 06/05/19   Chrismon, Jodell Ciproennis E, PA  amLODipine (NORVASC) 10 MG tablet TAKE 1 TABLET BY MOUTH EVERY DAY 06/01/19   Chrismon, Jodell Ciproennis E, PA  aspirin 81 MG chewable tablet Chew 81 mg by mouth at bedtime.     [provider]  atorvastatin (LIPITOR) 10 MG tablet Take 1 tablet (10 mg total) by mouth at bedtime. 06/01/19   Chrismon, Jodell Ciproennis E, PA  bisacodyl (BISACODYL) 5 MG EC tablet Take 5 mg by mouth daily as needed for moderate constipation.    [provider]  calcium carbonate (OSCAL) 1500 (600 Ca) MG TABS tablet Take 600 mg of elemental calcium by mouth 2 (two) times daily.     [provider]  cetirizine (ZYRTEC) 10 MG tablet Take 10 mg by mouth at bedtime.     [provider]  Cholecalciferol (VITAMIN D3) 2000 units TABS Take 2,000 Units by mouth at bedtime.     [provider]  ferrous sulfate 325 (65 FE) MG tablet Take 325 mg by mouth at bedtime.     [provider]  HYDROcodone-acetaminophen (NORCO/VICODIN) 5-325 MG tablet Take 1 tablet by mouth 2 (two) times daily as needed for moderate pain.    [provider]  ipratropium-albuterol (DUONEB) 0.5-2.5 (3) MG/3ML SOLN Take 3 mLs by nebulization every 6 (six) hours as needed. 12/10/18   Houston SirenSainani, Vivek J, MD  Multiple Vitamin (MULTIVITAMIN) tablet Take 1 tablet by mouth at bedtime.     [provider]  sertraline (ZOLOFT) 100 MG tablet Take 100 mg by mouth daily.    [provider]  sodium chloride (OCEAN) 0.65 % SOLN nasal spray Place 1 spray into both nostrils as needed (dryness).    [provider]  SPIRIVA HANDIHALER 18 MCG inhalation capsule INHALE 1 CAPSULE ONCE A DAY 02/09/19   Chrismon, Jodell Ciproennis E, PA  SUPER B  COMPLEX/C PO Take 1 tablet by mouth at bedtime.    [provider]  SYMBICORT 160-4.5 MCG/ACT inhaler INHALE 2 PUFFS INTO THE LUNGS 2 (TWO) TIMES DAILY. 03/23/19   Chrismon, Jodell Ciproennis E, PA  traZODone (DESYREL) 50 MG tablet Take 0.5-1 tablets (25-50 mg total) by mouth at bedtime as needed for sleep. Patient not taking: Reported on 04/29/2019 01/08/19   Chrismon, Jodell Ciproennis E, PA  vitamin E 400 UNIT capsule Take 400 Units by mouth at bedtime.     [provider]    Allergies Codeine  Family History  Problem Relation Age of Onset  . Heart disease Father 4775       CABG   . Hyperlipidemia Father   . Pneumonia Father   . Dementia Mother   . COPD Mother   . Diabetes Sister   . Parkinson's  disease Brother   . Post-traumatic stress disorder Son     Social History Social History   Tobacco Use  . Smoking status: Current Every Day Smoker    Packs/day: 1.50    Years: 45.00    Pack years: 67.50    Types: Cigarettes    Last attempt to quit: 12/08/2018    Years since quitting: 0.5  . Smokeless tobacco: Never Used  Substance Use Topics  . Alcohol use: No    Alcohol/week: 0.0 standard drinks  . Drug use: Yes    Comment: prescribed    Review of Systems Constitutional: No fever/chills Eyes: No visual changes. ENT: No sore throat. Cardiovascular: Denies chest pain. Respiratory: See HPI Gastrointestinal: See HPI.  No vomiting.  No diarrhea. Genitourinary: Negative for dysuria. Musculoskeletal: Negative for back pain. Skin: Negative for rash. Neurological: Negative for headaches, areas of focal weakness or numbness.    ____________________________________________   PHYSICAL EXAM:  VITAL SIGNS: ED Triage Vitals  Enc Vitals Group     BP 06/19/19 1709 (!) 170/67     Pulse Rate 06/19/19 1709 (!) 108     Resp --      Temp 06/19/19 1709 98.7 F (37.1 C)     Temp Source 06/19/19 1709 Oral     SpO2 06/19/19 1709 99 %     Weight 06/19/19 1707 100 lb (45.4 kg)      Height 06/19/19 1707 5\' 2"  (1.575 m)     Head Circumference --      Peak Flow --      Pain Score 06/19/19 1707 0     Pain Loc --      Pain Edu? --      Excl. in [place]? --     Constitutional: Alert and oriented.  Mildly ill-appearing, somewhat cachectic in appearance. Eyes: Conjunctivae are normal. Head: Atraumatic. Nose: No congestion/rhinnorhea. Mouth/Throat: Mucous membranes are dry. Neck: No stridor.  Cardiovascular: Just slightly tachycardic rate, regular rhythm. Grossly normal heart sounds.  Good peripheral circulation. Respiratory: Patient has mild tachypnea.  Mild use of accessory muscles.  Very prominent sternum, with some accessory muscle use and slight retractions.  There is crackles in mild wheezing noted in the bases bilaterally. Gastrointestinal: Soft and nontender. No distention.  Patient denies focal pain or discomfort to abdominal examination at this time.  Negative Murphy.  She does however report persistent discomfort in the stomach area for the last several weeks. Musculoskeletal: No lower extremity tenderness nor edema. Neurologic:  Normal speech and language. No gross focal neurologic deficits are appreciated.  Skin:  Skin is warm, dry and intact. No rash noted. Psychiatric: Mood and affect are normal. Speech and behavior are normal.  ____________________________________________   LABS (all labs ordered are listed, but only abnormal results are displayed)  Labs Reviewed  CBC WITH DIFFERENTIAL/PLATELET - Abnormal; Notable for the following components:      Result Value   RBC 3.02 (*)    Hemoglobin 9.6 (*)    HCT 31.2 (*)    MCV 103.3 (*)    All other components within normal limits  COMPREHENSIVE METABOLIC PANEL - Abnormal; Notable for the following components:   Chloride 81 (*)    CO2 48 (*)    Glucose, Bld 110 (*)    Creatinine, Ser 0.40 (*)    All other components within normal limits  TROPONIN I (HIGH SENSITIVITY) - Abnormal; Notable for the following  components:   Troponin I (High Sensitivity) 19 (*)  All other components within normal limits  SARS CORONAVIRUS 2 (HOSPITAL ORDER, PERFORMED IN [facility])  CULTURE, BLOOD (ROUTINE X 2)  CULTURE, BLOOD (ROUTINE X 2)  LIPASE, BLOOD  LACTIC ACID, PLASMA  PROCALCITONIN   ____________________________________________  EKG  Reviewed entered by me at 1720 Heart rate 105 QRS 90 QTc 450 Sinus tachycardia, no evidence of acute ischemia.  Probable right atrial enlargement ____________________________________________  RADIOLOGY  Dg Chest Port 1 View  Result Date: 06/19/2019 CLINICAL DATA:  Shortness of breath. EXAM: PORTABLE CHEST 1 VIEW COMPARISON:  December 08, 2018 FINDINGS: Cardiomediastinal silhouette is enlarged. Calcific atherosclerotic disease of the aorta and tortuosity. Mediastinal contours appear intact. There is no evidence of pleural effusion or pneumothorax. Patchy areas of linear airspace consolidation in the right upper, right middle and bilateral lower lung fields. Osseous structures are without acute abnormality. Soft tissues are grossly normal. IMPRESSION: Patchy areas of linear airspace consolidation in the right upper, right middle and bilateral lower lung fields. These may represent multifocal pneumonia or atelectatic changes. Electronically Signed   By: Ted Mcalpineobrinka  Dimitrova M.D.   On: 06/19/2019 17:47     X-ray reviewed by me, patchy airspace disease noted in multiple lung fields ____________________________________________   PROCEDURES  Procedure(s) performed: None  Procedures  Critical Care performed: Yes, see critical care note(s)  CRITICAL CARE Performed by: Sharyn CreamerMark Jahid Weida   Total critical care time: 30 minutes  Critical care time was exclusive of separately billable procedures and treating other patients.  Critical care was necessary to treat or prevent imminent or life-threatening deterioration.  Critical care was time spent personally by  me on the following activities: development of treatment plan with patient and/or surrogate as well as nursing, discussions with consultants, evaluation of patient's response to treatment, examination of patient, obtaining history from patient or surrogate, ordering and performing treatments and interventions, ordering and review of laboratory studies, ordering and review of radiographic studies, pulse oximetry and re-evaluation of patient's condition.  Sepsis, multiple antibiotics. Increased risk of morbidity due to Sepsis with > Hr and > RR. ____________________________________________   INITIAL IMPRESSION / ASSESSMENT AND PLAN / ED COURSE  Pertinent labs & imaging results that were available during my care of the patient were reviewed by me and considered in my medical decision making (see chart for details).   Patient reports 6 weeks abdominal pain, this appears to be persistent and without acute exacerbation based on her clinical history and her examination today.  However, she prevents worse for lightheadedness I suspect she is likely somewhat dehydrated with decreased appetite and also shortness of breath which she describes COPD.  No overt COVID risk factor but she does report shortness of breath and we will rapid test.  Obtain chest x-ray.  EKG shows no evidence of acute ischemia.  Send labs, review chest x-ray, initiate treatments for COPD including steroids, inhalers, and consider antibiotics based on x-ray and lab work  ----------------------------------------- 6:48 PM on 06/19/2019 -----------------------------------------  Coronavirus test negative.  Will switch to nebulizers as inhalers have not yet arrived.  Patient did take 3 pumps of her own albuterol inhaler for shortness of breath as we are awaiting her MDIs to arrive as we rule out COVID.  Patient will be admitted for further work-up, treatment for COPD exacerbation, pneumonia. [place] discussed her abdominal pain with Dr. Hilton SinclairWeiting,  which appears to me to be subacute and unlikely related to dyspnea which is my primary concern and reason for admission.  Patient agreeable.  ____________________________________________   FINAL CLINICAL IMPRESSION(S) / ED DIAGNOSES  Final diagnoses:  Community acquired pneumonia, unspecified laterality  COPD with acute exacerbation (HCC)  Sepsis, due to unspecified organism, unspecified whether acute organ dysfunction present [facility])        Note:  This document was prepared using Dragon voice recognition software and may include unintentional dictation errors       Sharyn Creamer, MD 06/19/19 1849

## 2019-06-19 NOTE — ED Notes (Signed)
Called report to 1A RN, they do not have any tele boxes available at this time but she will call me back when they get one for this pt

## 2019-06-20 LAB — BASIC METABOLIC PANEL
Anion gap: 8 (ref 5–15)
BUN: 10 mg/dL (ref 8–23)
CO2: 47 mmol/L — ABNORMAL HIGH (ref 22–32)
Calcium: 9.5 mg/dL (ref 8.9–10.3)
Chloride: 85 mmol/L — ABNORMAL LOW (ref 98–111)
Creatinine, Ser: 0.42 mg/dL — ABNORMAL LOW (ref 0.44–1.00)
GFR calc Af Amer: >60 mL/min (ref >60)
GFR calc non Af Amer: >60 mL/min (ref >60)
Glucose, Bld: 122 mg/dL — ABNORMAL HIGH (ref 70–99)
Potassium: 4.5 mmol/L (ref 3.5–5.1)
Sodium: 140 mmol/L (ref 135–145)

## 2019-06-20 LAB — CBC
HCT: 31.6 % — ABNORMAL LOW (ref 36.0–46.0)
Hemoglobin: 9.7 g/dL — ABNORMAL LOW (ref 12.0–15.0)
MCH: 32.2 pg (ref 26.0–34.0)
MCHC: 30.7 g/dL (ref 30.0–36.0)
MCV: 105 fL — ABNORMAL HIGH (ref 80.0–100.0)
Platelets: 322 10*3/uL (ref 150–400)
RBC: 3.01 MIL/uL — ABNORMAL LOW (ref 3.87–5.11)
RDW: 12.1 % (ref 11.5–15.5)
WBC: 7.9 10*3/uL (ref 4.0–10.5)
nRBC: 0 % (ref 0.0–0.2)

## 2019-06-20 MED ORDER — CALCIUM CARBONATE ANTACID 500 MG PO CHEW
600.0000 mg | CHEWABLE_TABLET | Freq: Two times a day (BID) | ORAL | Status: DC
  Administered 2019-06-21 – 2019-06-24 (×5): 600 mg via ORAL
  Filled 2019-06-20 (×6): qty 3

## 2019-06-20 MED ORDER — PREDNISONE 10 MG PO TABS: 50.0000 mg | ORAL_TABLET | Freq: Every day | ORAL | Status: DC

## 2019-06-20 MED ORDER — ALPRAZOLAM 0.25 MG PO TABS
0.2500 mg | ORAL_TABLET | Freq: Three times a day (TID) | ORAL | Status: DC | PRN
  Administered 2019-06-21 – 2019-06-24 (×6): 0.25 mg via ORAL
  Filled 2019-06-20 (×6): qty 1

## 2019-06-20 NOTE — Progress Notes (Signed)
Family Meeting Note  Advance Directive:yes  Today a meeting took place with the Patient.   The following clinical team members were present during this meeting:MD  The following were discussed:Patient's diagnosis: COPD, Pneumonia , Patient's progosis: Unable to determine and Goals for treatment: Full Code  Additional follow-up to be provided: PMD  Time spent during discussion:20 minutes  Vaughan Basta, MD

## 2019-06-20 NOTE — Progress Notes (Signed)
PT Cancellation Note  Patient Details Name: Hailey Gibbs MRN: [id number] DOB: [birth year]   Cancelled Treatment:    Reason Eval/Treat Not Completed: Other (comment). Consult received and chart reviewed. Upon arrival, pt with SOB symptoms reporting she doesn't feel good. She states she just finished getting back in bed from bathroom and isn't up to having therapy right now. Will re-attempt another time.   [name] 06/20/2019, 10:23 AM  Greggory Stallion, PT, DPT [phone]

## 2019-06-20 NOTE — Progress Notes (Signed)
[employer] - [place] [facility]   PATIENT NAME: Hailey SaaJuanita Jeffreys    MR#:  [id number]  DATE OF BIRTH:  05/28/1946  SUBJECTIVE:  CHIEF COMPLAINT:   Chief Complaint  Patient presents with  . Abdominal Pain   Came with shortness of breath, found her pneumonia and COPD. She had some anxiety and shaking today morning and told that she is feeling panicing. REVIEW OF SYSTEMS:  CONSTITUTIONAL: No fever, fatigue or weakness.  EYES: No blurred or double vision.  EARS, NOSE, AND THROAT: No tinnitus or ear pain.  RESPIRATORY: No cough, shortness of breath, wheezing or hemoptysis.  CARDIOVASCULAR: No chest pain, orthopnea, edema.  GASTROINTESTINAL: No nausea, vomiting, diarrhea or abdominal pain.  GENITOURINARY: No dysuria, hematuria.  ENDOCRINE: No polyuria, nocturia,  HEMATOLOGY: No anemia, easy bruising or bleeding SKIN: No rash or lesion. MUSCULOSKELETAL: No joint pain or arthritis.   NEUROLOGIC: No tingling, numbness, weakness.  PSYCHIATRY: No anxiety or depression.   ROS  DRUG ALLERGIES:   Allergies  Allergen Reactions  . Codeine Anxiety         VITALS:  Blood pressure (!) 145/71, pulse (!) 106, temperature 98 F (36.7 C), resp. rate 18, height 5\' 2"  (1.575 m), weight 45.4 kg, SpO2 99 %.  PHYSICAL EXAMINATION:  GENERAL:  73 y.o.-year-old patient lying in the bed with no acute distress.  EYES: Pupils equal, round, reactive to light and accommodation. No scleral icterus. Extraocular muscles intact.  HEENT: Head atraumatic, normocephalic. Oropharynx and nasopharynx clear.  NECK:  Supple, no jugular venous distention. No thyroid enlargement, no tenderness.  LUNGS: Normal breath sounds bilaterally, some wheezing, no crepitation. No use of accessory muscles of respiration.  CARDIOVASCULAR: S1, S2 normal. No murmurs, rubs, or gallops.  ABDOMEN: Soft, nontender, nondistended. Bowel sounds present. No organomegaly or mass.  EXTREMITIES: No pedal edema, cyanosis,  or clubbing.  NEUROLOGIC: Cranial nerves II through XII are intact. Muscle strength 5/5 in all extremities. Sensation intact. Gait not checked.  PSYCHIATRIC: The patient is alert and oriented x 3.  SKIN: No obvious rash, lesion, or ulcer.   Physical Exam LABORATORY PANEL:   CBC Recent Labs  Lab 06/20/19 0433  WBC 7.9  HGB 9.7*  HCT 31.6*  PLT 322   ------------------------------------------------------------------------------------------------------------------  Chemistries  Recent Labs  Lab 06/19/19 1718 06/20/19 0433  NA 136 140  K 3.7 4.5  CL 81* 85*  CO2 48* 47*  GLUCOSE 110* 122*  BUN 9 10  CREATININE 0.40* 0.42*  CALCIUM 9.1 9.5  AST 17  --   ALT 14  --   ALKPHOS 96  --   BILITOT 0.4  --    ------------------------------------------------------------------------------------------------------------------  Cardiac Enzymes No results for input(s): TROPONINI in the last 168 hours. ------------------------------------------------------------------------------------------------------------------  RADIOLOGY:  Dg Chest Port 1 View  Result Date: 06/19/2019 CLINICAL DATA:  Shortness of breath. EXAM: PORTABLE CHEST 1 VIEW COMPARISON:  December 08, 2018 FINDINGS: Cardiomediastinal silhouette is enlarged. Calcific atherosclerotic disease of the aorta and tortuosity. Mediastinal contours appear intact. There is no evidence of pleural effusion or pneumothorax. Patchy areas of linear airspace consolidation in the right upper, right middle and bilateral lower lung fields. Osseous structures are without acute abnormality. Soft tissues are grossly normal. IMPRESSION: Patchy areas of linear airspace consolidation in the right upper, right middle and bilateral lower lung fields. These may represent multifocal pneumonia or atelectatic changes. Electronically Signed   By: Ted Mcalpineobrinka  Dimitrova M.D.   On: 06/19/2019 17:47    ASSESSMENT AND PLAN:  Active Problems:   COPD exacerbation  ([place])  1.  Clinical sepsis with multifocal pneumonia, tachypnea and tachycardia.  Rocephin and Zithromax.  Follow-up cultures. 2.  COPD exacerbation.  Start Solu-Medrol and give nebulizer treatments since COVID-19 testing negative. As she is feeling anxious will stop IV steroid and change to oral. 3.  Acute on chronic respiratory failure.  Patient chronically on 2 L of oxygen 4.  Kyphoscoliosis 5.  Hypertension on Norvasc 6.  Hyperlipidemia unspecified on Lipitor 7.  Depression on Zoloft 8.  Anxiety-stop IV steroid and give oral Xanax as needed.   All the records are reviewed and case discussed with Care Management/Social Workerr. Management plans discussed with the patient, family and they are in agreement.  CODE STATUS: Full.  TOTAL TIME TAKING CARE OF THIS PATIENT: 35 minutes.    POSSIBLE D/C IN 1-2 DAYS, DEPENDING ON CLINICAL CONDITION.   Vaughan Basta M.D on 06/20/2019   Between 7am to 6pm - Pager - [phone]  After 6pm go to [email or web address] - password [email or web address] [facility]  Office  [phone]  CC: Primary care physician; Margo Common, PA  Note: This dictation was prepared with Dragon dictation along with smaller phrase technology. Any transcriptional errors that result from this process are unintentional.

## 2019-06-20 NOTE — Evaluation (Signed)
Physical Therapy Evaluation Patient Details Name: Hailey Gibbs MRN: [id number] DOB: 1946/05/11 Today's Date: 06/20/2019   History of Present Illness  Pt admitted for COPD exacerbation. Complains of SOB symptoms with abdominal pain. History includes 2L of O2 due to COPD, CAD, and GERD. Pt reports she has been feeling weaker x 3 day with decreased appetite and intake.  Clinical Impression  Pt is a pleasant 73 year old female who was admitted for COPD exacerbation. Pt performs bed mobility with cga and transfers with mod A and no AD. Refuses to ambulate this date due to fatigue and SOB symptoms. Needs hands on assist for balance during transfer to [facility]. Pt appears to have decreased safety awareness and in denial of limitations. Pt demonstrates deficits with strength/balance/mobility/endurance. Would benefit from skilled PT to address above deficits and promote optimal return to PLOF; recommend transition to STR upon discharge from acute hospitalization.     Follow Up Recommendations SNF    Equipment Recommendations  Rolling walker with 5" wheels    Recommendations for Other Services       Precautions / Restrictions Precautions Precautions: Fall Restrictions Weight Bearing Restrictions: No      Mobility  Bed Mobility Overal bed mobility: Needs Assistance Bed Mobility: Supine to Sit     Supine to sit: Min guard     General bed mobility comments: slightly impulsive. Needs cues to wait for therapist prior to attempting mobility efforts. Once seated, able to sit with upright posture.  Transfers Overall transfer level: Needs assistance Equipment used: None Transfers: Sit to/from Stand Sit to Stand: Mod assist         General transfer comment: able to SPT to [facility]. Very unsteady and needs assist for positioning.  Ambulation/Gait             General Gait Details: she is currently refusing to ambulate this date  Stairs            Wheelchair Mobility     Modified Rankin (Stroke Patients Only)       Balance Overall balance assessment: Needs assistance Sitting-balance support: Feet supported Sitting balance-Leahy Scale: Fair Sitting balance - Comments: scoliosis   Standing balance support: No upper extremity supported Standing balance-Leahy Scale: Poor Standing balance comment: reaching out for B UE support                             Pertinent Vitals/Pain Pain Assessment: No/denies pain    Home Living Family/patient expects to be discharged to:: Private residence Living Arrangements: Alone Available Help at Discharge: Family;Available PRN/intermittently(son) Type of Home: House Home Access: Stairs to enter Entrance Stairs-Rails: Can reach both Entrance Stairs-Number of Steps: 3 Home Layout: One level Home Equipment: Walker - 2 wheels      Prior Function Level of Independence: Independent         Comments: furniture ambulator, limited household distances     Journalist, newspaper        Extremity/Trunk Assessment   Upper Extremity Assessment Upper Extremity Assessment: Generalized weakness(B UE grossly 3+/5)    Lower Extremity Assessment Lower Extremity Assessment: Generalized weakness(B LE grossly 4/5)       Communication   Communication: No difficulties  Cognition Arousal/Alertness: Awake/alert Behavior During Therapy: WFL for tasks assessed/performed Overall Cognitive Status: Within Functional Limits for tasks assessed  General Comments      Exercises Other Exercises Other Exercises: min assist for hygiene and [facility]  Other Exercises: Able to perform SLR x 5 reps. SOB symptoms with minimal exertion   Assessment/Plan    PT Assessment Patient needs continued PT services  PT Problem List Decreased strength;Decreased activity tolerance;Decreased balance;Decreased mobility;Cardiopulmonary status limiting activity        PT Treatment Interventions Gait training;Therapeutic exercise;Balance training;DME instruction    PT Goals (Current goals can be found in the Care Plan section)  Acute Rehab PT Goals Patient Stated Goal: to go home PT Goal Formulation: With patient Time For Goal Achievement: 07/04/19 Potential to Achieve Goals: Good    Frequency Min 2X/week   Barriers to discharge        Co-evaluation               AM-PAC PT "6 Clicks" Mobility  Outcome Measure Help needed turning from your back to your side while in a flat bed without using bedrails?: None Help needed moving from lying on your back to sitting on the side of a flat bed without using bedrails?: A Little Help needed moving to and from a bed to a chair (including a wheelchair)?: A Lot Help needed standing up from a chair using your arms (e.g., wheelchair or bedside chair)?: A Lot Help needed to walk in hospital room?: A Lot Help needed climbing 3-5 steps with a railing? : Total 6 Click Score: 14    End of Session Equipment Utilized During Treatment: Oxygen Activity Tolerance: Patient limited by fatigue Patient left: in bed;with bed alarm set Nurse Communication: Mobility status PT Visit Diagnosis: Unsteadiness on feet (R26.81);Muscle weakness (generalized) (M62.81);Difficulty in walking, not elsewhere classified (R26.2)    Time: [id number] PT Time Calculation (min) (ACUTE ONLY): 17 min   Charges:   PT Evaluation $PT Eval Low Complexity: 1 Low PT Treatments $Therapeutic Activity: 8-22 mins        Elizabeth PalauStephanie Lamonta Cypress, PT, DPT [phone]   Seaton Hofmann 06/20/2019, 3:50 PM

## 2019-06-21 ENCOUNTER — Encounter: Payer: Self-pay | Admitting: Pulmonary Disease

## 2019-06-21 ENCOUNTER — Inpatient Hospital Stay: Payer: Medicare HMO

## 2019-06-21 DIAGNOSIS — E43 Unspecified severe protein-calorie malnutrition: Secondary | ICD-10-CM

## 2019-06-21 DIAGNOSIS — J810 Acute pulmonary edema: Secondary | ICD-10-CM

## 2019-06-21 DIAGNOSIS — F17200 Nicotine dependence, unspecified, uncomplicated: Secondary | ICD-10-CM

## 2019-06-21 DIAGNOSIS — J9622 Acute and chronic respiratory failure with hypercapnia: Secondary | ICD-10-CM

## 2019-06-21 DIAGNOSIS — J9621 Acute and chronic respiratory failure with hypoxia: Secondary | ICD-10-CM

## 2019-06-21 DIAGNOSIS — J441 Chronic obstructive pulmonary disease with (acute) exacerbation: Secondary | ICD-10-CM

## 2019-06-21 LAB — HEMOGLOBIN A1C
Hgb A1c MFr Bld: 4.8 % (ref 4.8–5.6)
Mean Plasma Glucose: 91.06 mg/dL

## 2019-06-21 LAB — GLUCOSE, CAPILLARY
Glucose-Capillary: 204 mg/dL — ABNORMAL HIGH (ref 70–99)
Glucose-Capillary: 105 mg/dL — ABNORMAL HIGH (ref 70–99)

## 2019-06-21 LAB — URINALYSIS, COMPLETE (UACMP) WITH MICROSCOPIC
Bilirubin Urine: NEGATIVE
Glucose, UA: NEGATIVE mg/dL
Hgb urine dipstick: NEGATIVE
Ketones, ur: 5 mg/dL — AB
Nitrite: NEGATIVE
Protein, ur: 30 mg/dL — AB
Specific Gravity, Urine: 1.014 (ref 1.005–1.030)
pH: 6 (ref 5.0–8.0)

## 2019-06-21 LAB — BRAIN NATRIURETIC PEPTIDE
B Natriuretic Peptide: 847 pg/mL — ABNORMAL HIGH (ref 0.0–100.0)
B Natriuretic Peptide: 1166 pg/mL — ABNORMAL HIGH (ref 0.0–100.0)

## 2019-06-21 LAB — PROCALCITONIN: Procalcitonin: <0.1 ng/mL

## 2019-06-21 LAB — MRSA PCR SCREENING: MRSA by PCR: NEGATIVE

## 2019-06-21 MED ORDER — HYDRALAZINE HCL 20 MG/ML IJ SOLN: 10.0000 mg | INTRAMUSCULAR | Status: DC | PRN

## 2019-06-21 MED ORDER — BUDESONIDE 0.25 MG/2ML IN SUSP
0.2500 mg | Freq: Two times a day (BID) | RESPIRATORY_TRACT | Status: DC
  Administered 2019-06-21 – 2019-06-24 (×6): 0.25 mg via RESPIRATORY_TRACT
  Filled 2019-06-21 (×6): qty 2

## 2019-06-21 MED ORDER — METHYLPREDNISOLONE SODIUM SUCC 40 MG IJ SOLR
40.0000 mg | Freq: Two times a day (BID) | INTRAMUSCULAR | Status: DC
  Administered 2019-06-21 – 2019-06-24 (×6): 40 mg via INTRAVENOUS
  Filled 2019-06-21 (×6): qty 1

## 2019-06-21 MED ORDER — ROCURONIUM BROMIDE 50 MG/5ML IV SOLN
INTRAVENOUS | Status: AC
  Filled 2019-06-21: qty 1

## 2019-06-21 MED ORDER — FENTANYL CITRATE (PF) 100 MCG/2ML IJ SOLN
INTRAMUSCULAR | Status: AC
  Filled 2019-06-21: qty 2

## 2019-06-21 MED ORDER — ORAL CARE MOUTH RINSE
15.0000 mL | Freq: Two times a day (BID) | OROMUCOSAL | Status: DC
  Administered 2019-06-22 – 2019-06-24 (×5): 15 mL via OROMUCOSAL

## 2019-06-21 MED ORDER — IPRATROPIUM-ALBUTEROL 0.5-2.5 (3) MG/3ML IN SOLN
3.0000 mL | RESPIRATORY_TRACT | Status: DC
  Administered 2019-06-21 – 2019-06-24 (×15): 3 mL via RESPIRATORY_TRACT
  Filled 2019-06-21 (×15): qty 3

## 2019-06-21 MED ORDER — INSULIN ASPART 100 UNIT/ML ~~LOC~~ SOLN
0.0000 [IU] | Freq: Three times a day (TID) | SUBCUTANEOUS | Status: DC
  Administered 2019-06-22: 3 [IU] via SUBCUTANEOUS
  Filled 2019-06-21: qty 1

## 2019-06-21 MED ORDER — INSULIN ASPART 100 UNIT/ML ~~LOC~~ SOLN: 0.0000 [IU] | SUBCUTANEOUS | Status: DC

## 2019-06-21 MED ORDER — MORPHINE SULFATE (PF) 2 MG/ML IV SOLN
1.0000 mg | INTRAVENOUS | Status: DC | PRN
  Administered 2019-06-22 (×2): 1 mg via INTRAVENOUS
  Filled 2019-06-21 (×2): qty 1

## 2019-06-21 MED ORDER — CHLORHEXIDINE GLUCONATE CLOTH 2 % EX PADS
6.0000 | MEDICATED_PAD | Freq: Every day | CUTANEOUS | Status: DC
  Administered 2019-06-21: 6 via TOPICAL

## 2019-06-21 MED ORDER — ETOMIDATE 2 MG/ML IV SOLN
INTRAVENOUS | Status: AC
  Filled 2019-06-21: qty 10

## 2019-06-21 MED ORDER — ALBUTEROL SULFATE (2.5 MG/3ML) 0.083% IN NEBU
2.5000 mg | INHALATION_SOLUTION | RESPIRATORY_TRACT | Status: DC | PRN
  Administered 2019-06-21: 2.5 mg via RESPIRATORY_TRACT
  Filled 2019-06-21: qty 3

## 2019-06-21 MED ORDER — ENSURE ENLIVE PO LIQD
237.0000 mL | Freq: Two times a day (BID) | ORAL | Status: DC
  Administered 2019-06-22: 330 mL via ORAL
  Administered 2019-06-23: 237 mL via ORAL

## 2019-06-21 MED ORDER — ENOXAPARIN SODIUM 30 MG/0.3ML ~~LOC~~ SOLN
30.0000 mg | SUBCUTANEOUS | Status: DC
  Administered 2019-06-21: 30 mg via SUBCUTANEOUS
  Filled 2019-06-21: qty 0.3

## 2019-06-21 MED ORDER — DEXTROSE 5 % IV SOLN
250.0000 mg | INTRAVENOUS | Status: AC
  Administered 2019-06-21 – 2019-06-24 (×4): 250 mg via INTRAVENOUS
  Filled 2019-06-21 (×5): qty 250

## 2019-06-21 MED ORDER — FOLIC ACID 5 MG/ML IJ SOLN
1.0000 mg | Freq: Every day | INTRAMUSCULAR | Status: DC
  Administered 2019-06-21 – 2019-06-24 (×4): 1 mg via INTRAVENOUS
  Filled 2019-06-21 (×4): qty 0.2

## 2019-06-21 MED ORDER — FUROSEMIDE 10 MG/ML IJ SOLN
20.0000 mg | Freq: Once | INTRAMUSCULAR | Status: AC
  Administered 2019-06-21: 20 mg via INTRAVENOUS
  Filled 2019-06-21: qty 2

## 2019-06-21 NOTE — Progress Notes (Signed)
Patient states it's painful when O2 sat is 88% and would like to be kept at 92-94%.

## 2019-06-21 NOTE — Progress Notes (Signed)
[employer] - [place] [facility]   PATIENT NAME: Hailey Gibbs    MR#:  [id number]  DATE OF BIRTH:  27-Aug-1946  SUBJECTIVE:  CHIEF COMPLAINT:   Chief Complaint  Patient presents with  . Abdominal Pain   Came with shortness of breath, found her pneumonia and COPD. Was very hypoxic this morning and transferred to ICU with BiPAP. REVIEW OF SYSTEMS:  CONSTITUTIONAL: No fever, fatigue or weakness.  EYES: No blurred or double vision.  EARS, NOSE, AND THROAT: No tinnitus or ear pain.  RESPIRATORY: No cough,have shortness of breath, wheezing ,no hemoptysis.  CARDIOVASCULAR: No chest pain, orthopnea, edema.  GASTROINTESTINAL: No nausea, vomiting, diarrhea or abdominal pain.  GENITOURINARY: No dysuria, hematuria.  ENDOCRINE: No polyuria, nocturia,  HEMATOLOGY: No anemia, easy bruising or bleeding SKIN: No rash or lesion. MUSCULOSKELETAL: No joint pain or arthritis.   NEUROLOGIC: No tingling, numbness, weakness.  PSYCHIATRY: No anxiety or depression.   ROS  DRUG ALLERGIES:   Allergies  Allergen Reactions  . Codeine Anxiety         VITALS:  Blood pressure 122/65, pulse (!) 103, temperature 98.4 F (36.9 C), temperature source Oral, resp. rate 17, height 5\' 2"  (1.575 m), weight 45.4 kg, SpO2 96 %.  PHYSICAL EXAMINATION:  GENERAL:  73 y.o.-year-old patient lying in the bed with critical appearance.  EYES: Pupils equal, round, reactive to light and accommodation. No scleral icterus. Extraocular muscles intact.  HEENT: Head atraumatic, normocephalic. Oropharynx and nasopharynx clear.  NECK:  Supple, no jugular venous distention. No thyroid enlargement, no tenderness.  LUNGS: Normal breath sounds bilaterally, some wheezing, no crepitation. Positive for use of accessory muscles of respiration. On BIpap CARDIOVASCULAR: S1, S2 normal. No murmurs, rubs, or gallops.  ABDOMEN: Soft, nontender, nondistended. Bowel sounds present. No organomegaly or mass.  EXTREMITIES:  No pedal edema, cyanosis, or clubbing.  NEUROLOGIC: Cranial nerves II through XII are intact. Muscle strength 4/5 in all extremities. Sensation intact. Gait not checked.  PSYCHIATRIC: The patient is alert and oriented x 2.  SKIN: No obvious rash, lesion, or ulcer.   Physical Exam LABORATORY PANEL:   CBC Recent Labs  Lab 06/20/19 0433  WBC 7.9  HGB 9.7*  HCT 31.6*  PLT 322   ------------------------------------------------------------------------------------------------------------------  Chemistries  Recent Labs  Lab 06/19/19 1718 06/20/19 0433  NA 136 140  K 3.7 4.5  CL 81* 85*  CO2 48* 47*  GLUCOSE 110* 122*  BUN 9 10  CREATININE 0.40* 0.42*  CALCIUM 9.1 9.5  AST 17  --   ALT 14  --   ALKPHOS 96  --   BILITOT 0.4  --    ------------------------------------------------------------------------------------------------------------------  Cardiac Enzymes No results for input(s): TROPONINI in the last 168 hours. ------------------------------------------------------------------------------------------------------------------  RADIOLOGY:  Dg Chest Port 1 View  Result Date: 06/21/2019 CLINICAL DATA:  Rapid response from flor to unit this AM, increasing SOB, difficulty breathing.Hx of COPD, smoker EXAM: PORTABLE CHEST 1 VIEW COMPARISON:  06/19/2019 FINDINGS: Interstitial and hazy airspace opacities noted bilaterally have mildly increased since the previous day's study. Lungs are hyperexpanded.  No pneumothorax. IMPRESSION: 1. Mild worsening in lung aeration with an interval increase in bilateral interstitial and hazy airspace opacities. Suspect multifocal pneumonia. Electronically Signed   By: Amie Portlandavid  Ormond M.D.   On: 06/21/2019 09:14   Dg Chest Port 1 View  Result Date: 06/19/2019 CLINICAL DATA:  Shortness of breath. EXAM: PORTABLE CHEST 1 VIEW COMPARISON:  December 08, 2018 FINDINGS: Cardiomediastinal silhouette is enlarged. Calcific atherosclerotic  disease of the aorta  and tortuosity. Mediastinal contours appear intact. There is no evidence of pleural effusion or pneumothorax. Patchy areas of linear airspace consolidation in the right upper, right middle and bilateral lower lung fields. Osseous structures are without acute abnormality. Soft tissues are grossly normal. IMPRESSION: Patchy areas of linear airspace consolidation in the right upper, right middle and bilateral lower lung fields. These may represent multifocal pneumonia or atelectatic changes. Electronically Signed   By: Fidela Salisbury M.D.   On: 06/19/2019 17:47    ASSESSMENT AND PLAN:   Active Problems:   COPD exacerbation ([place])  1.  Clinical sepsis with multifocal pneumonia, tachypnea and tachycardia.  Rocephin and Zithromax.  Follow-up cultures. As now worsening condition check MRSA PCR and antibiotics may be changed as per ICU physician.  2.  Acute on chronic hypoxic respiratory failure due to COPD exacerbation.  Start Solu-Medrol and give nebulizer treatments since COVID-19 testing negative.  3.  Acute on chronic respiratory failure.  Patient chronically on 2 L of oxygen    Had worsening respiratory status and transferred to ICU 06/21/19- with BiPAP use. 4.  Kyphoscoliosis 5.  Hypertension on Norvasc 6.  Hyperlipidemia unspecified on Lipitor 7.  Depression on Zoloft 8.  Anxiety-stop IV steroid and give oral Xanax as needed.  I spoke to patient's son on the phone and informed about this critical condition  All the records are reviewed and case discussed with Care Management/Social Workerr. Management plans discussed with the patient, family and they are in agreement.  CODE STATUS: Full.  TOTAL TIME TAKING CARE OF THIS PATIENT: 45 minutes.    POSSIBLE D/C IN 1-2 DAYS, DEPENDING ON CLINICAL CONDITION.   Vaughan Basta M.D on 06/21/2019   Between 7am to 6pm - Pager - [phone]  After 6pm go to [email or web address] - password [email or web address] [place] [facility]  Office   [phone]  CC: Primary care physician; Margo Common, PA  Note: This dictation was prepared with Dragon dictation along with smaller phrase technology. Any transcriptional errors that result from this process are unintentional.

## 2019-06-21 NOTE — Progress Notes (Signed)
Initial Nutrition Assessment  DOCUMENTATION CODES:   Underweight  INTERVENTION:   Ensure Enlive po BID, each supplement provides 350 kcal and 20 grams of protein  MVI daily   NUTRITION DIAGNOSIS:   Increased nutrient needs related to catabolic illness(COPD) as evidenced by increased estimated needs.  GOAL:   Patient will meet greater than or equal to 90% of their needs  MONITOR:   PO intake, Supplement acceptance, Labs, Weight trends, Skin, I & O's  REASON FOR ASSESSMENT:   Malnutrition Screening Tool    ASSESSMENT:   73 y/o female with h/o COPD admitted with COPD exacerbation and PNA with sepsis  RD working remotely.  Pt reports poor appetite and oral intake for 1 month pta. Pt with poor appetite and oral intake in hospital. RD will add supplements to help pt meet her estimated needs. Pt reports an 8lb weight loss over the past few weeks. Per chart, pt has lost 26lbs(20%) over the past 6 months; this is significant weight loss.   Pt at high risk for malnutrition but unable to diagnose at this time as NFPE cannot be performed.    Medications reviewed and include: aspirin, vitamin D, lovenox, ferrous sulfate, folic acid, insulin, solu-medrol, MVI, azithromycin    Labs reviewed: creat 0.42(L) Hgb 9.7(L), Hct 31.6(L)  Unable to complete Nutrition-Focused physical exam at this time.   Diet Order:   Diet Order            Diet regular Room service appropriate? Yes; Fluid consistency: Thin  Diet effective now             EDUCATION NEEDS:   Not appropriate for education at this time  Skin:  Skin Assessment: Reviewed RN Assessment(ecchymosis)  Last BM:  pta  Height:   Ht Readings from Last 1 Encounters:  06/19/19 5\' 2"  (1.575 m)    Weight:   Wt Readings from Last 1 Encounters:  06/19/19 45.4 kg    Ideal Body Weight:  50 kg  BMI:  Body mass index is 18.29 kg/m.  Estimated Nutritional Needs:   Kcal:  1200-1400kcal/day  Protein:   68-77g/day  Fluid:  >1.1L/day  Koleen Distance MS, RD, LDN Pager #- [phone] Office#- [phone] After Hours Pager: [phone]

## 2019-06-21 NOTE — Plan of Care (Signed)

## 2019-06-21 NOTE — Progress Notes (Signed)
Rapid Response Event Note  Overview:   On morning assessment pt 77% on 3L oxygen. Rapid response called, pt transferred to ICU.     Interventions: Non rebreather, transfer to ICU  Plan of Care (if not transferred):  MD Boyce Medici notiifed    Nadara Eaton

## 2019-06-21 NOTE — Progress Notes (Signed)
Pt calling out to have bipap taken off and when going in room attempting to pull it off. Pt states, "I have boogers in my nose and can't breathe, it's making my antsy." Explained to pt the importance of keeping bipap on. Pt stated "I just need to take it off and relax for a little bit then I'll put it back on." Pt taken off bipap and placed on 6L [place] with sats 88-91%. Pt requesting ice chips but told that she may only have swabs as she will likely have to go back on bipap shortly. Pt verbalized understanding.

## 2019-06-21 NOTE — Progress Notes (Signed)
Attempted to take pt off bipap, and despite 6l by [place], pt O2 sats dropped to mid 80s and sustained. Coarse crackles at bilateral bases. Maggie, NP notified and to bedside to see pt. Orders put in and pt put back on bipap. Explained to pt that it would be best to leave her on bipap overnight, pt verbalized understanding. Will continue to monitor.

## 2019-06-21 NOTE — Progress Notes (Signed)
After giving PO meds, pt became acutely hypoxic with O2 sats in the 70s-80s. Pt did not endorse any SOB, increased O2 from 3L to 4L, attempted to have pt cough, deep breath, etc without any improvement in O2 sats. Lung sounds clear to diminished bilaterally. Pt placed back on bipap with improvement in sats to the low 90s quickly. Pt agreeable to having bipap put back on, but stated "Please don't leave this thing on me too long." RN explained that we would leave it on for half an hour and see how her O2 sats were doing, and then take it off if possible. O2 sats currently 97%. Will continue to monitor.

## 2019-06-21 NOTE — Progress Notes (Signed)
PT Cancellation Note  Patient Details Name: Hailey Gibbs MRN: [id number] DOB: Mar 20, 1946   Cancelled Treatment:    Reason Eval/Treat Not Completed: Other (comment). Pt with change in status, now transferred to CCU due to bipap needs. Will cancel current order. Please re-order PT services when medically stable.   [name] 06/21/2019, 10:15 AM  Greggory Stallion, PT, DPT [phone]

## 2019-06-21 NOTE — Significant Event (Signed)
Rapid Response Event Note  Overview: Time Called: 6314 Arrival Time: 0745 Event Type: Respiratory  Initial Focused Assessment: called for RR for pt with  Resp failure/ COPD/ PNA   Interventions: Immediately transferred to ICU for bipap and further assessment. Simonds notified.  Plan of Care (if not transferred):  Event Summary: Name of Physician Notified: Boyce Medici at [phone]  Name of Consulting Physician Notified: Simonds at [phone]  Outcome: Transferred (Comment)(tx to ICU for bipap)  Event End Time: [place] A

## 2019-06-21 NOTE — Progress Notes (Signed)
Son Hailey Gibbs notified pt was transferred to ICU room 12, and given ICU's phone number.

## 2019-06-21 NOTE — Consult Note (Signed)
PULMONARY CONSULT NOTE  PT PROFILE: 73 y.o. female heavy current every day smoker with very severe COPD, chronically O2 dependent adm 07/10 with acute respiratory distress and R sided abdominal pain. Admission CXR revealed patchy B infiltrates which were interpreted to represent possible PNA. Adm diagnoses were acute on chronic respiratory failure, COPD exacerbation, multifocal pneumonia.  On the morning of 7/12 she developed severe respiratory distress requiring rapid response call.  Transfer to ICU/SDU and BiPAP was administered.  With administration of nebulized bronchodilators and furosemide, respiratory distress improved significantly.  DATA:   INTERVAL:  HPI:  As above.  Level 5 caveat as patient is on BiPAP and somewhat somnolent.  Past Medical History:  Diagnosis Date  . Abnormal chest sounds 08/29/2015  . Acute respiratory failure (HCC)   . Anemia   . Anxiety   . BP (high blood pressure) 08/29/2015  . Carotid artery occlusion   . Carotid stenosis 06/23/2015  . COPD (chronic obstructive pulmonary disease) (HCC)   . COPD exacerbation (HCC) 10/26/2016  . Coronary artery disease   . Depression   . Dysrhythmia   . GERD (gastroesophageal reflux disease)   . History of orthopnea   . HOH (hard of hearing)   . Hyperlipidemia   . Hypertension   . Obstructive chronic bronchitis with exacerbation (HCC) 08/29/2015  . Osteoporosis   . Oxygen dependent    2L/MIN CONTINUOUS  . Palliative care by specialist   . Palpitations   . Seasonal allergies   . Shortness of breath dyspnea     Past Surgical History:  Procedure Laterality Date  . CATARACT EXTRACTION W/PHACO Left 11/04/2018   Procedure: CATARACT EXTRACTION PHACO AND INTRAOCULAR LENS PLACEMENT (IOC);  Surgeon: Galen ManilaPorfilio, William, MD;  Location: [facility] ORS;  Service: Ophthalmology;  Laterality: Left;  US 00:49.1 CDE 6.32 Fluid Pack Lot # [id number]  . CATARACT EXTRACTION W/PHACO Right 11/25/2018   Procedure: CATARACT EXTRACTION PHACO  AND INTRAOCULAR LENS PLACEMENT (IOC) RIGHT;  Surgeon: Galen ManilaPorfilio, William, MD;  Location: [facility] ORS;  Service: Ophthalmology;  Laterality: Right;  US   00:51 CDE  8.93 Fluid pack lot #[id number]  . CATARACT EXTRACTION, BILATERAL Bilateral   . ENDARTERECTOMY Left 06/23/2015   Procedure: ENDARTERECTOMY CAROTID;  Surgeon: Annice NeedyJason S Dew, MD;  Location: [facility] ORS;  Service: Vascular;  Laterality: Left;  . TUBAL LIGATION    . VAGINAL HYSTERECTOMY      MEDICATIONS: I have reviewed all medications and confirmed regimen as documented  Social History   Socioeconomic History  . Marital status: Divorced    Spouse name: Not on file  . Number of children: 2  . Years of education: Not on file  . Highest education level: 12th grade  Occupational History  . Occupation: retired  Engineer, productionocial Needs  . Financial resource strain: Not hard at all  . Food insecurity    Worry: Never true    Inability: Never true  . Transportation needs    Medical: No    Non-medical: No  Tobacco Use  . Smoking status: Current Every Day Smoker    Packs/day: 1.50    Years: 45.00    Pack years: 67.50    Types: Cigarettes    Last attempt to quit: 12/08/2018    Years since quitting: 0.5  . Smokeless tobacco: Never Used  Substance and Sexual Activity  . Alcohol use: No    Alcohol/week: 0.0 standard drinks  . Drug use: Yes    Comment: prescribed  . Sexual activity: Not on file  Lifestyle  .  Physical activity    Days per week: 0 days    Minutes per session: 0 min  . Stress: Only a little  Relationships  . Social Musicianconnections    Talks on phone: Patient refused    Gets together: Patient refused    Attends religious service: Patient refused    Active member of club or organization: Patient refused    Attends meetings of clubs or organizations: Patient refused    Relationship status: Patient refused  . Intimate partner violence    Fear of current or ex partner: Patient refused    Emotionally abused: Patient refused     Physically abused: Patient refused    Forced sexual activity: Patient refused  Other Topics Concern  . Not on file  Social History Narrative  . Not on file    Family History  Problem Relation Age of Onset  . Heart disease Father [id number]       CABG   . Hyperlipidemia Father   . Pneumonia Father   . Dementia Mother   . COPD Mother   . Diabetes Sister   . Parkinson's disease Brother   . Post-traumatic stress disorder Son     ROS: Level 5 caveat   Vitals:   06/21/19 0807 06/21/19 0900 06/21/19 1100 06/21/19 1200  BP: (!) 175/85 (!) 148/65  121/63  Pulse: (!) 118 (!) 112 (!) 101 (!) 107  Resp: (!) 27 (!) 25 20 20   Temp:    98.4 F (36.9 C)  TempSrc:  Axillary  Oral  SpO2: 94% (!) 87%  96%  Weight:      Height:      BiPAP 12/6.  FiO2 40%   EXAM:  Extremely frail-appearing, mildly somnolent, adequately supported on BiPAP HEENT: NCAT, sclerae white Neck: Supple, JVP not visualized Chest: Barrel chested, diminished BS throughout, scattered expiratory wheezes, bibasilar crackles Cardiac: Regular, tachy, no murmur noted Abdomen: Soft, NT, diminished BS Extremities: Severe sarcopenia, cool, no edema Neuro: Cranial nerves intact, moves all extremities  DATA:   BMP Latest Ref Rng & Units 06/20/2019 06/19/2019 04/23/2019  Glucose 70 - 99 mg/dL 132(G122(H) 401(U110(H) 272(Z104(H)  BUN 8 - 23 mg/dL 10 9 10   Creatinine 0.44 - 1.00 mg/dL 3.66(Y0.42(L) 4.03(K0.40(L) 7.420.68  BUN/Creat Ratio 12 - 28 - - 15  Sodium 135 - 145 mmol/L 140 136 141  Potassium 3.5 - 5.1 mmol/L 4.5 3.7 3.7  Chloride 98 - 111 mmol/L 85(L) 81(L) 89(L)  CO2 22 - 32 mmol/L 47(H) 48(H) 36(H)  Calcium 8.9 - 10.3 mg/dL 9.5 9.1 11.2(H)    CBC Latest Ref Rng & Units 06/20/2019 06/19/2019 04/23/2019  WBC 4.0 - 10.5 K/uL 7.9 9.1 10.7  Hemoglobin 12.0 - 15.0 g/dL 5.9(D9.7(L) 6.3(O9.6(L) 75.612.1  Hematocrit 36.0 - 46.0 % 31.6(L) 31.2(L) 36.5  Platelets 150 - 400 K/uL 322 302 350    CXR: Severe COPD with edema pattern  I have personally reviewed all chest  radiographs reported above including CXRs and CT chest unless otherwise indicated  IMPRESSION:   Acute on chronic hypoxemic/hypercarbic respiratory failure Very severe baseline COPD COPD exacerbation Likely pulmonary edema.  Note elevated BNP (847) Elevated serum bicarb - likely compensation for chronic respiratory acidosis Severe protein-calorie malnutrition Doubt pneumonia Chronic macrocytic anemia without acute blood loss Recalcitrant smoker Acute encephalopathy   PLAN:  Continue supplemental O2 to maintain SPO2 >90% BiPAP as needed for respiratory distress Continue nebulized steroids and bronchodilators Change prednisone to methylprednisolone Change azithromycin to IV and complete 4 more doses Furosemide  administered this morning with market improvement in respiratory status EKG ordered Follow BNP and CXR Trop I in AM 07/13   She is a very poor candidate for intubation or CPR/ACLS. I spoke with her son, Gwyndolyn Saxon who informs me that she is markedly impaired at her baseline. Nonetheless, she continues to smoke heavily. They have never discussed Advanced Directives. He feels that she might need to go to some level of care facility after discharge from hospital. [place] Consultation has been requested   Merton Border, MD PCCM service Mobile [phone] Pager [phone] 06/21/2019 12:57 PM

## 2019-06-22 ENCOUNTER — Inpatient Hospital Stay: Payer: Medicare HMO

## 2019-06-22 DIAGNOSIS — Z7189 Other specified counseling: Secondary | ICD-10-CM

## 2019-06-22 DIAGNOSIS — Z515 Encounter for palliative care: Secondary | ICD-10-CM

## 2019-06-22 DIAGNOSIS — J189 Pneumonia, unspecified organism: Secondary | ICD-10-CM

## 2019-06-22 LAB — CBC
HCT: 30.2 % — ABNORMAL LOW (ref 36.0–46.0)
Hemoglobin: 9.2 g/dL — ABNORMAL LOW (ref 12.0–15.0)
MCH: 31.8 pg (ref 26.0–34.0)
MCHC: 30.5 g/dL (ref 30.0–36.0)
MCV: 104.5 fL — ABNORMAL HIGH (ref 80.0–100.0)
Platelets: 275 10*3/uL (ref 150–400)
RBC: 2.89 MIL/uL — ABNORMAL LOW (ref 3.87–5.11)
RDW: 12.2 % (ref 11.5–15.5)
WBC: 7.8 10*3/uL (ref 4.0–10.5)
nRBC: 0 % (ref 0.0–0.2)

## 2019-06-22 LAB — COMPREHENSIVE METABOLIC PANEL
ALT: 19 U/L (ref 0–44)
AST: 25 U/L (ref 15–41)
Albumin: 3.5 g/dL (ref 3.5–5.0)
Alkaline Phosphatase: 74 U/L (ref 38–126)
Anion gap: 8 (ref 5–15)
BUN: 17 mg/dL (ref 8–23)
CO2: 46 mmol/L — ABNORMAL HIGH (ref 22–32)
Calcium: 9.6 mg/dL (ref 8.9–10.3)
Chloride: 85 mmol/L — ABNORMAL LOW (ref 98–111)
Creatinine, Ser: 0.47 mg/dL (ref 0.44–1.00)
GFR calc Af Amer: >60 mL/min (ref >60)
GFR calc non Af Amer: >60 mL/min (ref >60)
Glucose, Bld: 108 mg/dL — ABNORMAL HIGH (ref 70–99)
Potassium: 3.8 mmol/L (ref 3.5–5.1)
Sodium: 139 mmol/L (ref 135–145)
Total Bilirubin: 0.5 mg/dL (ref 0.3–1.2)
Total Protein: 5.9 g/dL — ABNORMAL LOW (ref 6.5–8.1)

## 2019-06-22 LAB — GLUCOSE, CAPILLARY
Glucose-Capillary: 105 mg/dL — ABNORMAL HIGH (ref 70–99)
Glucose-Capillary: 137 mg/dL — ABNORMAL HIGH (ref 70–99)
Glucose-Capillary: 206 mg/dL — ABNORMAL HIGH (ref 70–99)

## 2019-06-22 LAB — PROCALCITONIN: Procalcitonin: 0.56 ng/mL

## 2019-06-22 LAB — TROPONIN I (HIGH SENSITIVITY)
Troponin I (High Sensitivity): 215 ng/L (ref <18)
Troponin I (High Sensitivity): 140 ng/L (ref <18)
Troponin I (High Sensitivity): 135 ng/L (ref <18)

## 2019-06-22 MED ORDER — MORPHINE SULFATE 10 MG/5ML PO SOLN: 2.5000 mg | ORAL | Status: DC | PRN

## 2019-06-22 MED ORDER — STERILE WATER FOR INJECTION IJ SOLN
INTRAMUSCULAR | Status: AC
  Administered 2019-06-22: 17:00:00 2.5 mL
  Filled 2019-06-22: qty 10

## 2019-06-22 MED ORDER — ENOXAPARIN SODIUM 40 MG/0.4ML ~~LOC~~ SOLN
40.0000 mg | SUBCUTANEOUS | Status: DC
  Administered 2019-06-22 – 2019-06-23 (×2): 40 mg via SUBCUTANEOUS
  Filled 2019-06-22 (×2): qty 0.4

## 2019-06-22 MED ORDER — POTASSIUM CHLORIDE 20 MEQ PO PACK
20.0000 meq | PACK | Freq: Once | ORAL | Status: AC
  Administered 2019-06-22: 20 meq via ORAL
  Filled 2019-06-22: qty 1

## 2019-06-22 MED ORDER — ACETAZOLAMIDE SODIUM 500 MG IJ SOLR
250.0000 mg | Freq: Once | INTRAMUSCULAR | Status: AC
  Administered 2019-06-22: 250 mg via INTRAVENOUS
  Filled 2019-06-22: qty 250

## 2019-06-22 NOTE — Consult Note (Signed)
Consultation Note Date: 06/22/2019   Patient Name: Hailey Gibbs  DOB: 1946-09-18  MRN: [id number]  Age / Sex: 73 y.o., female  PCP: Chrismon, Vickki Muff, PA Referring Physician: Vaughan Basta, *  Reason for Consultation: Establishing goals of care  HPI/Patient Profile: 73 y.o. female  with past medical history of severe COPD on home O2, HTN, anxiety, anemia, GERD admitted on 06/19/2019 with COPD exacerbation and pneumonia. She has required bipap during admission. Now on her home O2 requirement. Palliative medicine consulted for [place].    Clinical Assessment and Goals of Care:  I have reviewed medical records including EPIC notes, labs and imaging, received report from patient's RN, assessed the patient and then met at the bedside along with patient and her son- Gwyndolyn Saxon to discuss diagnosis prognosis, [place], EOL wishes, disposition and options.  I introduced Palliative Medicine as specialized medical care for people living with serious illness. It focuses on providing relief from the symptoms and stress of a serious illness. The goal is to improve quality of life for both the patient and the family.  We discussed a brief life review of the patient. She worked in [employer] fund[employer]. Divorced. Gwyndolyn Saxon is her only child.   As far as functional and nutritional status- she is independent with ADL's- but can only bathe by sitting in her bathroom and giving herself a spongebath. She has her groceries delivered and she prepares her own meals. She has had significant weight loss in the last six months (down to 45kg this admission from 57kg in January indicating >10% weight loss in 6 months).  Armanie notes SOB that increases when she ambulates or when she completes her ADLs. Her SOB increases her anxiety, which increases her SOB. Additionally, she has difficulty sleeping.   We discussed her current illness and what it means  in the larger context of her on-going co-morbidities.  Natural disease trajectory and expectations at EOL were discussed.  I attempted to elicit values and goals of care important to the patient. Maintaining her independence and NOT going to a nursing facility are MOST important to her.   The difference between aggressive medical intervention and comfort care was considered in light of the patient's goals of care.   Advanced directives, concepts specific to code status, artifical feeding and hydration, and rehospitalization were considered and discussed.  Hospice and Palliative Care services outpatient were explained and offered.   Questions and concerns were addressed.  The family was encouraged to call with questions or concerns.   Primary Decision Maker PATIENT    SUMMARY OF RECOMMENDATIONS -GOC- stabilize, treat treatable- then d/c home with Hospice- GOC is to remain living at home independently for as long as possible with Hospice support -DNR -Symptom mgmt  2.5 mg liquid morphine q2hr prn for SOB, anxiety -Patient would like to complete HCPOA and Living Will- will request Chaplain Consult    Code Status/Advance Care Planning:  DNR   Psycho-social/Spiritual:   Desire for further Chaplaincy support:yes  Prognosis:    < 6 months d/t  severe COPD, decrease in nutritional and functional status  Discharge Planning: Home with Hospice  Primary Diagnoses: Present on Admission: . COPD exacerbation ([place])   I have reviewed the medical record, interviewed the patient and family, and examined the patient. The following aspects are pertinent.  Scheduled Meds: . acetaZOLAMIDE  250 mg Intravenous Once  . aspirin  81 mg Oral QHS  . budesonide (PULMICORT) nebulizer solution  0.25 mg Nebulization BID  . calcium carbonate  600 mg of elemental calcium Oral BID WC  . Chlorhexidine Gluconate Cloth  6 each Topical Q0600  . cholecalciferol  2,000 Units Oral QHS  . enoxaparin (LOVENOX)  injection  40 mg Subcutaneous Q24H  . feeding supplement (ENSURE ENLIVE)  237 mL Oral BID BM  . ferrous sulfate  325 mg Oral QHS  . folic acid  1 mg Intravenous Daily  . insulin aspart  0-9 Units Subcutaneous TID WC  . ipratropium-albuterol  3 mL Nebulization Q4H  . mouth rinse  15 mL Mouth Rinse BID  . methylPREDNISolone (SOLU-MEDROL) injection  40 mg Intravenous Q12H  . multivitamin with minerals  1 tablet Oral QHS  . potassium chloride  20 mEq Oral Once  . sterile water (preservative free)       Continuous Infusions: . sodium chloride Stopped (06/22/19 0916)  . azithromycin Stopped (06/22/19 1016)   PRN Meds:.acetaminophen **OR** acetaminophen, ALPRAZolam, bisacodyl, hydrALAZINE, HYDROcodone-acetaminophen, morphine, morphine injection, [DISCONTINUED] ondansetron **OR** ondansetron (ZOFRAN) IV, sodium chloride  Allergies  Allergen Reactions  . Codeine Anxiety        Review of Systems  Constitutional: Positive for activity change, appetite change and unexpected weight change.  Respiratory: Positive for shortness of breath.     Physical Exam Vitals signs and nursing note reviewed.  Constitutional:      Comments: cachectic  Pulmonary:     Comments: Increased RR Skin:    Coloration: Skin is pale.  Neurological:     Mental Status: She is oriented to person, place, and time.  Psychiatric:        Mood and Affect: Mood normal.        Thought Content: Thought content normal.     Vital Signs: BP 136/67   Pulse (!) 105   Temp 98 F (36.7 C) (Oral)   Resp (!) 33   Ht '5\' 2"'$  (1.575 m)   Wt 45.4 kg   SpO2 100%   BMI 18.29 kg/m  Pain Scale: 0-10   Pain Score: 0-No pain   SpO2: SpO2: 100 % O2 Device:SpO2: 100 % O2 Flow Rate: .O2 Flow Rate (L/min): 4 L/min  IO: Intake/output summary:   Intake/Output Summary (Last 24 hours) at 06/22/2019 1650 Last data filed at 06/22/2019 1500 Gross per 24 hour  Intake 1572.3 ml  Output 1300 ml  Net 272.3 ml    LBM: Last BM Date:  (PTA) Baseline Weight: Weight: 45.4 kg Most recent weight: Weight: 45.4 kg     Palliative Assessment/Data: PPS: 40%     Thank you for this consult. Palliative medicine will continue to follow and assist as needed.   Time In: 1530 Time Out: 1650 Time Total: 80 mins Greater than 50%  of this time was spent counseling and coordinating care related to the above assessment and plan.  Signed by: Mariana Kaufman, AGNP-C Palliative Medicine    Please contact Palliative Medicine Team phone at [phone] for questions and concerns.  For individual provider: See Shea Evans

## 2019-06-22 NOTE — Progress Notes (Signed)
CRITICAL VALUE STICKER  CRITICAL VALUE: Troponin 215  RECEIVER (on-site recipient of call): Darlyn Chamber, RN  DATE & TIME NOTIFIED: 06/22/2019 0525  MESSENGER (representative from lab):  MD NOTIFIED: Burman Nieves, NP  TIME OF NOTIFICATION: 0533  RESPONSE: acknowledged, no new orders at this time

## 2019-06-22 NOTE — Evaluation (Signed)
Occupational Therapy Evaluation Patient Details Name: Hailey Gibbs MRN: [id number] DOB: [birth year] Today's Date: 06/22/2019    History of Present Illness presented to ER secondary to fever, SOB; admitted for management of sepsis due to multifocal PNA, COPD exacerbation.  Hospital course significant for rapid response 7/12 due to severe respiratory distress, requiring transfer to CCU for BiPAP; now weaned to 4L O2 via [place].   Clinical Impression   Pt seen for OT evaluation this date. Pt was independent in all ADL and mobility, living in a 1 story home alone with 2 cats and on 2 liters of O2 at home. Pt reports becoming easily fatigued or out of breath with minimize exertion. Pt currently requires supervision to PRN minimal assist for LB ADL and CGA for functional mobility due to poor activity tolerance, balance, and decreased knowledge of DME/AE. Pt educated in energy conservation conservation strategies including home/routines modifications, work simplification, AE/DME, and falls prevention. Handout needed. Pt verbalized understanding but would benefit from additional skilled OT services to maximize recall and carryover of learned techniques and facilitate implementation of learned techniques into daily routines. Upon discharge, recommend HHOT (as well as PT, RN, and aide).      Follow Up Recommendations  Home health OT    Equipment Recommendations  None recommended by OT    Recommendations for Other Services       Precautions / Restrictions Precautions Precautions: Fall Restrictions Weight Bearing Restrictions: No      Mobility Bed Mobility Overal bed mobility: Needs Assistance Bed Mobility: Supine to Sit;Sit to Supine     Supine to sit: Supervision;Min assist Sit to supine: Supervision   General bed mobility comments: assist for truncal elevation  Transfers Overall transfer level: Needs assistance Equipment used: Rolling walker (2 wheeled) Transfers: Sit to/from  Stand Sit to Stand: Min guard         General transfer comment: cuing for hand placement, tends to pull on RW    Balance Overall balance assessment: Needs assistance Sitting-balance support: No upper extremity supported;Feet supported Sitting balance-Leahy Scale: Good     Standing balance support: Bilateral upper extremity supported Standing balance-Leahy Scale: Fair                             ADL either performed or assessed with clinical judgement   ADL Overall ADL's : Needs assistance/impaired                         Toilet Transfer: RW;Ambulation;BSC;Min guard   PsychiatristToileting- Clothing Manipulation and Hygiene: Set up;Sitting/lateral lean;Supervision/safety         General ADL Comments: CGA for LB ADL and functional mobility with RW     Vision Baseline Vision/History: No visual deficits Patient Visual Report: No change from baseline Vision Assessment?: No apparent visual deficits     Perception     Praxis      Pertinent Vitals/Pain Pain Assessment: No/denies pain     Hand Dominance Right   Extremity/Trunk Assessment Upper Extremity Assessment Upper Extremity Assessment: Overall WFL for tasks assessed   Lower Extremity Assessment Lower Extremity Assessment: Overall WFL for tasks assessed(grossly 4-/5 throughout)   Cervical / Trunk Assessment Cervical / Trunk Assessment: Kyphotic   Communication Communication Communication: No difficulties   Cognition Arousal/Alertness: Awake/alert Behavior During Therapy: WFL for tasks assessed/performed Overall Cognitive Status: Within Functional Limits for tasks assessed  General Comments  noted curvature of the spine    Exercises Other Exercises Other Exercises: Toilet transfer, SPT to/from [facility] with RW, cga/min assist; sit/stand from standard toilet heigth, cga/min assist; standing balance for hygiene, clothing management, cga/min assist.   Tends to release RW at times with dynamic activities; will continue to reinforce cuing/education regarding safety needs Other Exercises: Pt educated in energy conservation strategies   Shoulder Instructions      Home Living Family/patient expects to be discharged to:: Private residence Living Arrangements: Alone Available Help at Discharge: Family;Available PRN/intermittently(son) Type of Home: House Home Access: Stairs to enter [employer] of Steps: 1 Entrance Stairs-Rails: Right;Left;Can reach both Home Layout: One level     Bathroom Shower/Tub: Teacher, early years/pre: Standard     Home Equipment: Environmental consultant - 2 wheels          Prior Functioning/Environment Level of Independence: Independent        Comments: Indep with ADLs, household mobilization; 'furniture walks'; home O2 at 2L. Orders groceries online and drives short familiar distances        OT Problem List: Decreased strength;Decreased activity tolerance;Cardiopulmonary status limiting activity;Impaired balance (sitting and/or standing);Decreased knowledge of use of DME or AE      OT Treatment/Interventions: Self-care/ADL training;Therapeutic activities;Therapeutic exercise;Energy conservation;DME and/or AE instruction;Patient/family education;Balance training    OT Goals(Current goals can be found in the care plan section) Acute Rehab OT Goals Patient Stated Goal: to go home OT Goal Formulation: With patient Time For Goal Achievement: 07/06/19 Potential to Achieve Goals: Good ADL Goals Pt Will Transfer to Toilet: with supervision;ambulating;regular height toilet(LRAD for amb) Additional ADL Goal #1: Pt will perform LB dressing with set up and supervision without signs of SOB using learned energy conservation strategies as needed. Additional ADL Goal #2: Pt will verbalize plan to implement at least 2 learned energy conservation strategies at home to maximize safety/indep with ADL and  functional mobility.  OT Frequency: Min 1X/week   Barriers to D/C:            Co-evaluation PT/OT/SLP Co-Evaluation/Treatment: Yes Reason for Co-Treatment: To address functional/ADL transfers PT goals addressed during session: Mobility/safety with mobility OT goals addressed during session: ADL's and self-care;Proper use of Adaptive equipment and DME      AM-PAC OT "6 Clicks" Daily Activity     Outcome Measure Help from another person eating meals?: None Help from another person taking care of personal grooming?: None Help from another person toileting, which includes using toliet, bedpan, or urinal?: A Little Help from another person bathing (including washing, rinsing, drying)?: A Little Help from another person to put on and taking off regular upper body clothing?: None Help from another person to put on and taking off regular lower body clothing?: A Little 6 Click Score: 21   End of Session Equipment Utilized During Treatment: Gait belt;Rolling walker Nurse Communication: Other (comment)(pt wanted RN to note that pt's son will be bringing pt dinner tonight)  Activity Tolerance: Patient tolerated treatment well Patient left: in bed;with call bell/phone within reach;with bed alarm set  OT Visit Diagnosis: Other abnormalities of gait and mobility (R26.89);History of falling (Z91.81);Muscle weakness (generalized) (M62.81)                Time: 2355-7322 OT Time Calculation (min): 29 min Charges:  OT General Charges $OT Visit: 1 Visit OT Evaluation $OT Eval Moderate Complexity: 1 Mod OT Treatments $Self Care/Home Management : 8-22 mins  Jeni Salles, MPH, MS, OTR/L ascom  [phone] 06/22/19, 3:17 PM

## 2019-06-22 NOTE — Plan of Care (Signed)

## 2019-06-22 NOTE — Progress Notes (Signed)
Shift summary:  - O2 weaned to 2L Hailey Gibbs, has remained off of BiPAP today.  - Out of bed to recliner with walker + 1 assist.  - Remain in SDU for now.

## 2019-06-22 NOTE — Progress Notes (Signed)
SYNOPSIS PT PROFILE: 73 y.o. female heavy current every day smoker with very severe COPD, chronically O2 dependent adm 07/10 with acute respiratory distress and R sided abdominal pain. Admission CXR revealed patchy B infiltrates which were interpreted to represent possible PNA. Adm diagnoses were acute on chronic respiratory failure, COPD exacerbation, multifocal pneumonia.  On the morning of 7/12 she developed severe respiratory distress requiring rapid response call.  Transfer to ICU/SDU and BiPAP was administered.  With administration of nebulized bronchodilators and furosemide, respiratory distress improved significantly.   CC  Follow up resp failure and COPD exacerbation   HPI remains SOB less wheezing today On/off biPAP      VITALS: BP 136/63   Pulse (!) 102   Temp 97.8 F (36.6 C) (Axillary)   Resp 17   Ht 5\' 2"  (1.575 m)   Wt 45.4 kg   SpO2 100%   BMI 18.29 kg/m     I/O last 3 completed shifts: In: 1742.1 [P.O.:300; I.V.:1092.1; IV Piggyback:350] Out: 910 [Urine:910] No intake/output data recorded.  SpO2: 100 % O2 Flow Rate (L/min): 4 L/min FiO2 (%): 50 %   Review of Systems:  Gen:  Denies  fever, sweats, chills weigh loss  HEENT: Denies blurred vision, double vision, ear pain, eye pain, hearing loss, nose bleeds, sore throat Cardiac:  No dizziness, chest pain or heaviness, chest tightness,edema, No JVD Resp:   No cough, -sputum production, +shortness of breath,-wheezing, -hemoptysis,  Gi: Denies swallowing difficulty, stomach pain, nausea or vomiting, diarrhea, constipation, bowel incontinence Gu:  Denies bladder incontinence, burning urine Ext:   Denies Joint pain, stiffness or swelling Skin: Denies  skin rash, easy bruising or bleeding or hives Endoc:  Denies polyuria, polydipsia , polyphagia or weight change Psych:   Denies depression, insomnia or hallucinations  Other:  All other systems negative  Physical Examination:   GENERAL increased  WOB HEAD: Normocephalic, atraumatic.  EYES: PERLA, EOMI No scleral icterus.  NECK: Supple. No thyromegaly.  No JVD.  PULMONARY: CTA B/L + wheezing, CARDIOVASCULAR: S1 and S2. Regular rate and rhythm. No murmurs GASTROINTESTINAL: Soft, nontender, nondistended. Positive bowel sounds.  MUSCULOSKELETAL: No swelling, clubbing, or edema.  NEUROLOGIC: No gross focal neurological deficits. 5/5 strength all extremities SKIN: No ulceration, lesions, rashes, or cyanosis.  PSYCHIATRIC: Insight, judgment intact. -depression -anxiety ALL OTHER ROS ARE NEGATIVE    I personally reviewed Labs under Results section.   MEDICATIONS: I have reviewed all medications and confirmed regimen as documented    COVID-19 NEGATIVE: Acute COVID-19 infection ruled out by PCR.   CULTURE RESULTS   Recent Results (from the past 240 hour(s))  SARS Coronavirus 2 (CEPHEID- Performed in [place] hospital lab), Hosp Order     Status: None   Collection Time: 06/19/19  5:18 PM   Specimen: Nasopharyngeal Swab  Result Value Ref Range Status   SARS Coronavirus 2 NEGATIVE NEGATIVE Final    Comment: (NOTE) If result is NEGATIVE SARS-CoV-2 target nucleic acids are NOT DETECTED. The SARS-CoV-2 RNA is generally detectable in upper and lower  respiratory specimens during the acute phase of infection. The lowest  concentration of SARS-CoV-2 viral copies this assay can detect is 250  copies / mL. A negative result does not preclude SARS-CoV-2 infection  and should not be used as the sole basis for treatment or other  patient management decisions.  A negative result may occur with  improper specimen collection / handling, submission of specimen other  than nasopharyngeal swab, presence of viral mutation(s) within the  areas targeted by this assay, and inadequate number of viral copies  (<250 copies / mL). A negative result must be combined with clinical  observations, patient history, and epidemiological information. If  result is POSITIVE SARS-CoV-2 target nucleic acids are DETECTED. The SARS-CoV-2 RNA is generally detectable in upper and lower  respiratory specimens dur ing the acute phase of infection.  Positive  results are indicative of active infection with SARS-CoV-2.  Clinical  correlation with patient history and other diagnostic information is  necessary to determine patient infection status.  Positive results do  not rule out bacterial infection or co-infection with other viruses. If result is PRESUMPTIVE POSTIVE SARS-CoV-2 nucleic acids MAY BE PRESENT.   A presumptive positive result was obtained on the submitted specimen  and confirmed on repeat testing.  While 2019 novel coronavirus  (SARS-CoV-2) nucleic acids may be present in the submitted sample  additional confirmatory testing may be necessary for epidemiological  and / or clinical management purposes  to differentiate between  SARS-CoV-2 and other Sarbecovirus currently known to infect humans.  If clinically indicated additional testing with an alternate test  methodology [phone]([id number]) is advised. The SARS-CoV-2 RNA is generally  detectable in upper and lower respiratory sp ecimens during the acute  phase of infection. The expected result is Negative. Fact Sheet for Patients:  BoilerBrush.com.cyhttps://www.fda.gov/media/136312/download Fact Sheet for Healthcare Providers: https://pope.com/https://www.fda.gov/media/136313/download This test is not yet approved or cleared by the [place] FDA and has been authorized for detection and/or diagnosis of SARS-CoV-2 by FDA under an Emergency Use Authorization (EUA).  This EUA will remain in effect (meaning this test can be used) for the duration of the COVID-19 declaration under Section 564(b)(1) of the Act, 21 U.S.C. section 360bbb-3(b)(1), unless the authorization is terminated or revoked sooner. Performed at [facility], [address] [place], [place] [address]   Blood Culture (routine x 2)     Status:  None (Preliminary result)   Collection Time: 06/19/19  6:07 PM   Specimen: BLOOD  Result Value Ref Range Status   Specimen Description BLOOD L WRIST  Final   Special Requests   Final    BOTTLES DRAWN AEROBIC AND ANAEROBIC Blood Culture adequate volume   Culture   Final    NO GROWTH 3 DAYS Performed at [facility], [address] [place], [place] [address]    Report Status PENDING  Incomplete  Blood Culture (routine x 2)     Status: None (Preliminary result)   Collection Time: 06/19/19  6:07 PM   Specimen: BLOOD  Result Value Ref Range Status   Specimen Description BLOOD R ARM  Final   Special Requests   Final    BOTTLES DRAWN AEROBIC AND ANAEROBIC Blood Culture adequate volume   Culture   Final    NO GROWTH 3 DAYS Performed at [facility], [address] [place], [place] [address]    Report Status PENDING  Incomplete  MRSA PCR Screening     Status: None   Collection Time: 06/21/19  8:12 AM   Specimen: Nasal Mucosa; Nasopharyngeal  Result Value Ref Range Status   MRSA by PCR NEGATIVE NEGATIVE Final    Comment:        The GeneXpert MRSA Assay (FDA approved for NASAL specimens only), is one component of a comprehensive MRSA colonization surveillance program. It is not intended to diagnose MRSA infection nor to guide or monitor treatment for MRSA infections. Performed at [facility], [address] [place],  [place] [address]           IMAGING    Dg Chest Port 1 View  Result Date: 06/21/2019 CLINICAL DATA:  Rapid response from flor to unit this AM, increasing SOB, difficulty breathing.Hx of COPD, smoker EXAM: PORTABLE CHEST 1 VIEW COMPARISON:  06/19/2019 FINDINGS: Interstitial and hazy airspace opacities noted bilaterally have mildly increased since the previous day's study. Lungs are hyperexpanded.  No pneumothorax. IMPRESSION: 1. Mild worsening in lung aeration with an interval increase in bilateral interstitial and hazy airspace  opacities. Suspect multifocal pneumonia. Electronically Signed   By: Amie Portlandavid  Ormond M.D.   On: 06/21/2019 09:14        ASSESSMENT AND PLAN SYNOPSIS  SEVERE COPD EXACERBATION -continue IV steroids as prescribed -continue NEB THERAPY as prescribed -morphine as needed -wean fio2 as needed and tolerated   HpEF CARDIAC FAILURE-  -oxygen as needed -Lasix as tolerated  Severe ACUTE Hypoxic and Hypercapnic Respiratory Failure On/off biPAP -continue Bronchodilator Therapy   ELECTROLYTES -follow labs as needed -replace as needed -pharmacy consultation and following    DVT/GI PRX ordered TRANSFUSIONS AS NEEDED MONITOR FSBS ASSESS the need for LABS as needed    Family discussion/Palliatve care consulted Patient with end stage COPD and severe debilitating and crippling  resp function She is a very poor candidate for intubation or CPR/ACLS. I spoke with her son, Chrissie NoaWilliam who informs me that she is markedly impaired at her baseline. Nonetheless, she continues to smoke heavily. They have never discussed Advanced Directives. He feels that she might need to go to some level of care facility after discharge from hospital. Palliative Care Consultation has been requested   Remains SD status   Wallis BambergKurian Santiago Gladavid Renea Schoonmaker, M.D.  [facility]  Medical Director [facility] Medical Director [facility] Cardio-Pulmonary Department

## 2019-06-22 NOTE — Evaluation (Signed)
Physical Therapy Evaluation Patient Details Name: Hailey Gibbs MRN: [id number] DOB: 02-05-1946 Today's Date: 06/22/2019   History of Present Illness  presented to ER secondary to fever, SOB; admitted for management of sepsis due to multifocal PNA, COPD exacerbation.  Hospital course significant for rapid response 7/12 due to severe respiratory distress, requiring transfer to CCU for BiPAP; now weaned to 4L O2 via [place].  Clinical Impression  Upon re-evaluation, patient alert and oriented; follows commands, agreeable to session with min encouragement from therapist/nursing.  Bilat UE/LE strength and ROM grossly symmetrical and WFL; no focal weakness, but generally weak and deconditioned due to chronic illness.  Currently requiring min assist for bed mobility; cga/min assist for sit/stand, basic transfers and gait (50') with RW.  Demonstrates reciprocal stepping pattern with forward trunk flexion, RW arms length anterior to patient; slightly impulsive, limited insight/awareness of activity pacing and signs/symptoms of fatigue.  BORG 8/10 after limited gait distance, sats >96% on 4L.   Patient absolutely insistent upon discharge home; unwilling to consider anything further at this time. Would benefit from skilled PT to address above deficits and promote optimal return to PLOF; Recommend transition to home with max HH services (PT, OT, RN, aide) upon discharge from acute hospitalization.     Follow Up Recommendations SNF    Equipment Recommendations  Rolling walker with 5" wheels    Recommendations for Other Services       Precautions / Restrictions Precautions Precautions: Fall Restrictions Weight Bearing Restrictions: No      Mobility  Bed Mobility Overal bed mobility: Needs Assistance Bed Mobility: Supine to Sit;Sit to Supine     Supine to sit: Supervision;Min assist Sit to supine: Supervision   General bed mobility comments: assist for truncal elevation  Transfers Overall  transfer level: Needs assistance Equipment used: Rolling walker (2 wheeled) Transfers: Sit to/from Stand Sit to Stand: Min guard         General transfer comment: cuing for hand placement, tends to pull on RW  Ambulation/Gait Ambulation/Gait assistance: Min guard Gait Distance (Feet): 50 Feet Assistive device: Rolling walker (2 wheeled)       General Gait Details: reciprocal stepping pattern with forward trunk flexion, RW arms length anterior to patient; slightly impulsive, limited insight/awareness of activity pacing and signs/symptoms of fatigue.  BORG 8/10 after limited gait distance, sats >96% on 4L  Stairs            Wheelchair Mobility    Modified Rankin (Stroke Patients Only)       Balance Overall balance assessment: Needs assistance Sitting-balance support: No upper extremity supported;Feet supported Sitting balance-Leahy Scale: Good     Standing balance support: Bilateral upper extremity supported Standing balance-Leahy Scale: Fair                               Pertinent Vitals/Pain Pain Assessment: No/denies pain    Home Living Family/patient expects to be discharged to:: Private residence Living Arrangements: Alone   Type of Home: House Home Access: Stairs to enter Entrance Stairs-Rails: Right;Left;Can reach both Entrance Stairs-Number of Steps: 1 Home Layout: One level Home Equipment: Walker - 2 wheels      Prior Function Level of Independence: Independent         Comments: Indep with ADLs, household mobilization; 'furniture walks'; home O2 at 2L     Hand Dominance   Dominant Hand: Right    Extremity/Trunk Assessment   Upper Extremity Assessment  Upper Extremity Assessment: Overall WFL for tasks assessed    Lower Extremity Assessment Lower Extremity Assessment: Overall WFL for tasks assessed(grossly 4-/5 throughout)    Cervical / Trunk Assessment Cervical / Trunk Assessment: Kyphotic  Communication    Communication: No difficulties  Cognition Arousal/Alertness: Awake/alert Behavior During Therapy: WFL for tasks assessed/performed Overall Cognitive Status: Within Functional Limits for tasks assessed                                        General Comments      Exercises Other Exercises Other Exercises: Toilet transfer, SPT to/from [facility] with RW, cga/min assist; sit/stand from standard toilet heigth, cga/min assist; standing balance for hygiene, clothing management, cga/min assist.  Tends to release RW at times with dynamic activities; will continue to reinforce cuing/education regarding safety needs   Assessment/Plan    PT Assessment Patient needs continued PT services  PT Problem List Decreased strength;Decreased activity tolerance;Decreased balance;Decreased mobility;Cardiopulmonary status limiting activity;Decreased knowledge of use of DME;Decreased safety awareness;Decreased knowledge of precautions       PT Treatment Interventions Gait training;Therapeutic exercise;Balance training;DME instruction;Functional mobility training;Therapeutic activities;Patient/family education    PT Goals (Current goals can be found in the Care Plan section)  Acute Rehab PT Goals Patient Stated Goal: to go home PT Goal Formulation: With patient Time For Goal Achievement: 07/06/19 Potential to Achieve Goals: Good    Frequency Min 2X/week   Barriers to discharge        Co-evaluation PT/OT/SLP Co-Evaluation/Treatment: Yes Reason for Co-Treatment: To address functional/ADL transfers PT goals addressed during session: Mobility/safety with mobility OT goals addressed during session: ADL's and self-care;Proper use of Adaptive equipment and DME       AM-PAC PT "6 Clicks" Mobility  Outcome Measure Help needed turning from your back to your side while in a flat bed without using bedrails?: None Help needed moving from lying on your back to sitting on the side of a flat bed without  using bedrails?: None Help needed moving to and from a bed to a chair (including a wheelchair)?: A Little Help needed standing up from a chair using your arms (e.g., wheelchair or bedside chair)?: A Little Help needed to walk in hospital room?: A Little Help needed climbing 3-5 steps with a railing? : A Little 6 Click Score: 20    End of Session Equipment Utilized During Treatment: Gait belt Activity Tolerance: Patient tolerated treatment well Patient left: in bed;with call bell/phone within reach(OT at bedside to complete evaluation) Nurse Communication: Mobility status PT Visit Diagnosis: Muscle weakness (generalized) (M62.81);Difficulty in walking, not elsewhere classified (R26.2)    Time: 1610-9604 PT Time Calculation (min) (ACUTE ONLY): 18 min   Charges:   PT Evaluation $PT Re-evaluation: 1 Re-eval PT Treatments $Therapeutic Activity: 8-22 mins       Hailey Gibbs, PT, DPT, NCS 06/22/19, 2:59 PM [phone]

## 2019-06-22 NOTE — Progress Notes (Signed)
OT Cancellation Note  Patient Details Name: Hailey Gibbs MRN: [id number] DOB: August 30, 1946   Cancelled Treatment:    Reason Eval/Treat Not Completed: Other (comment). Consult received, chart reviewed. Pt meeting with NP. Will re-attempt OT evaluation at later date/time as pt is available and medically appropriate.    Jeni Salles, MPH, MS, OTR/L ascom [phone] 06/22/19, 1:17 PM

## 2019-06-22 NOTE — Progress Notes (Signed)
[place] [facility] [place] NAME: Hailey Gibbs    MR#:  [id number]  DATE OF BIRTH:  04-06-1946  SUBJECTIVE:  CHIEF COMPLAINT:   Chief Complaint  Patient presents with  . Abdominal Pain   Came with shortness of breath, found her pneumonia and COPD. Was very hypoxic and transferred to ICU with BiPAP.  Improved now and back on nasal cannula oxygen. REVIEW OF SYSTEMS:  CONSTITUTIONAL: No fever, fatigue or weakness.  EYES: No blurred or double vision.  EARS, NOSE, AND THROAT: No tinnitus or ear pain.  RESPIRATORY: No cough,have shortness of breath, wheezing ,no hemoptysis.  CARDIOVASCULAR: No chest pain, orthopnea, edema.  GASTROINTESTINAL: No nausea, vomiting, diarrhea or abdominal pain.  GENITOURINARY: No dysuria, hematuria.  ENDOCRINE: No polyuria, nocturia,  HEMATOLOGY: No anemia, easy bruising or bleeding SKIN: No rash or lesion. MUSCULOSKELETAL: No joint pain or arthritis.   NEUROLOGIC: No tingling, numbness, weakness.  PSYCHIATRY: No anxiety or depression.   ROS  DRUG ALLERGIES:   Allergies  Allergen Reactions  . Codeine Anxiety         VITALS:  Blood pressure 136/67, pulse (!) 105, temperature 98 F (36.7 C), temperature source Oral, resp. rate (!) 33, height 5\' 2"  (1.575 m), weight 45.4 kg, SpO2 100 %.  PHYSICAL EXAMINATION:  GENERAL:  73 y.o.-year-old patient lying in the bed with no acute distress.  EYES: Pupils equal, round, reactive to light and accommodation. No scleral icterus. Extraocular muscles intact.  HEENT: Head atraumatic, normocephalic. Oropharynx and nasopharynx clear.  NECK:  Supple, no jugular venous distention. No thyroid enlargement, no tenderness.  LUNGS: Normal breath sounds bilaterally, some wheezing, no crepitation. No for use of accessory muscles of respiration.  CARDIOVASCULAR: S1, S2 normal. No murmurs, rubs, or gallops.  ABDOMEN: Soft, nontender, nondistended. Bowel sounds present. No organomegaly  or mass.  EXTREMITIES: No pedal edema, cyanosis, or clubbing.  NEUROLOGIC: Cranial nerves II through XII are intact. Muscle strength 4/5 in all extremities. Sensation intact. Gait not checked.  PSYCHIATRIC: The patient is alert and oriented x 3.  SKIN: No obvious rash, lesion, or ulcer.   Physical Exam LABORATORY PANEL:   CBC Recent Labs  Lab 06/22/19 0402  WBC 7.8  HGB 9.2*  HCT 30.2*  PLT 275   ------------------------------------------------------------------------------------------------------------------  Chemistries  Recent Labs  Lab 06/22/19 0402  NA 139  K 3.8  CL 85*  CO2 46*  GLUCOSE 108*  BUN 17  CREATININE 0.47  CALCIUM 9.6  AST 25  ALT 19  ALKPHOS 74  BILITOT 0.5   ------------------------------------------------------------------------------------------------------------------  Cardiac Enzymes No results for input(s): TROPONINI in the last 168 hours. ------------------------------------------------------------------------------------------------------------------  RADIOLOGY:  Dg Chest Port 1 View  Result Date: 06/22/2019 CLINICAL DATA:  73 year old female with respiratory failure EXAM: PORTABLE CHEST 1 VIEW COMPARISON:  Prior chest x-ray 06/21/2019 FINDINGS: The mediastinal and cardiac silhouette remain unchanged. Calcifications present in the transverse aorta. Slight interval increase in small to moderate layering right pleural effusion. Extensive bilateral interstitial prominence and chronic bronchitic changes. No pneumothorax. Remote healed right-sided rib fractures. No acute osseous abnormality. IMPRESSION: 1. Interval increase in small to moderate layering right pleural effusion with an associated increase in right basilar airspace opacity which may reflect atelectasis and/or infiltrate. 2. Stable background bronchitic changes and chronic interstitial prominence. 3.  Aortic Atherosclerosis (ICD10-170.0) Electronically Signed   By: Jacqulynn Cadet  M.D.   On: 06/22/2019 08:18   Dg Chest Port 1 View  Result Date: 06/21/2019 CLINICAL  DATA:  Rapid response from flor to unit this AM, increasing SOB, difficulty breathing.Hx of COPD, smoker EXAM: PORTABLE CHEST 1 VIEW COMPARISON:  06/19/2019 FINDINGS: Interstitial and hazy airspace opacities noted bilaterally have mildly increased since the previous day's study. Lungs are hyperexpanded.  No pneumothorax. IMPRESSION: 1. Mild worsening in lung aeration with an interval increase in bilateral interstitial and hazy airspace opacities. Suspect multifocal pneumonia. Electronically Signed   By: Amie Portlandavid  Ormond M.D.   On: 06/21/2019 09:14    ASSESSMENT AND PLAN:   Active Problems:   COPD exacerbation (HCC)  1.  Clinical sepsis with multifocal pneumonia, tachypnea and tachycardia.  Rocephin and Zithromax.  Follow-up cultures.  2.  Acute on chronic hypoxic respiratory failure due to COPD exacerbation.  Start Solu-Medrol and give nebulizer treatments since COVID-19 testing negative.  3.  Acute on chronic respiratory failure.  Patient chronically on 2 L of oxygen    Had worsening respiratory status and transferred to ICU 06/21/19- with BiPAP use. After steroids and Lasix dose with nebulizer treatment patient improved and came off the BiPAP now on nasal cannula.  4.  Kyphoscoliosis 5.  Hypertension on Norvasc 6.  Hyperlipidemia unspecified on Lipitor 7.  Depression on Zoloft 8.  Anxiety-stop IV steroid and give oral Xanax as needed.   All the records are reviewed and case discussed with Care Management/Social Workerr. Management plans discussed with the patient, family and they are in agreement.  CODE STATUS: Full.  TOTAL TIME TAKING CARE OF THIS PATIENT: 35 minutes.    POSSIBLE D/C IN 1-2 DAYS, DEPENDING ON CLINICAL CONDITION.   Altamese DillingVaibhavkumar Justyce Yeater M.D on 06/22/2019   Between 7am to 6pm - Pager - [phone]  After 6pm go to [email or web address] - password [email or web address]  [facility]  Office  [phone]  CC: Primary care physician; Tamsen Roershrismon, Dennis E, PA  Note: This dictation was prepared with Dragon dictation along with smaller phrase technology. Any transcriptional errors that result from this process are unintentional.

## 2019-06-23 ENCOUNTER — Inpatient Hospital Stay (HOSPITAL_COMMUNITY)
Admit: 2019-06-23 | Discharge: 2019-06-23 | Disposition: A | Discharge status: Home / Self Care | Payer: Medicare HMO | Attending: Internal Medicine | Admitting: Internal Medicine

## 2019-06-23 DIAGNOSIS — J189 Pneumonia, unspecified organism: Secondary | ICD-10-CM

## 2019-06-23 DIAGNOSIS — J9690 Respiratory failure, unspecified, unspecified whether with hypoxia or hypercapnia: Secondary | ICD-10-CM

## 2019-06-23 DIAGNOSIS — I340 Nonrheumatic mitral (valve) insufficiency: Secondary | ICD-10-CM

## 2019-06-23 DIAGNOSIS — Z7189 Other specified counseling: Secondary | ICD-10-CM

## 2019-06-23 LAB — BASIC METABOLIC PANEL
Anion gap: 7 (ref 5–15)
BUN: 24 mg/dL — ABNORMAL HIGH (ref 8–23)
CO2: 42 mmol/L — ABNORMAL HIGH (ref 22–32)
Calcium: 9.3 mg/dL (ref 8.9–10.3)
Chloride: 89 mmol/L — ABNORMAL LOW (ref 98–111)
Creatinine, Ser: 0.48 mg/dL (ref 0.44–1.00)
GFR calc Af Amer: >60 mL/min (ref >60)
GFR calc non Af Amer: >60 mL/min (ref >60)
Glucose, Bld: 120 mg/dL — ABNORMAL HIGH (ref 70–99)
Potassium: 3.5 mmol/L (ref 3.5–5.1)
Sodium: 138 mmol/L (ref 135–145)

## 2019-06-23 LAB — CBC WITH DIFFERENTIAL/PLATELET
Abs Immature Granulocytes: 0.04 10*3/uL (ref 0.00–0.07)
Basophils Absolute: 0 10*3/uL (ref 0.0–0.1)
Basophils Relative: 0 %
Eosinophils Absolute: 0 10*3/uL (ref 0.0–0.5)
Eosinophils Relative: 0 %
HCT: 26.6 % — ABNORMAL LOW (ref 36.0–46.0)
Hemoglobin: 8.3 g/dL — ABNORMAL LOW (ref 12.0–15.0)
Immature Granulocytes: 1 %
Lymphocytes Relative: 11 %
Lymphs Abs: 0.9 10*3/uL (ref 0.7–4.0)
MCH: 31.8 pg (ref 26.0–34.0)
MCHC: 31.2 g/dL (ref 30.0–36.0)
MCV: 101.9 fL — ABNORMAL HIGH (ref 80.0–100.0)
Monocytes Absolute: 0.9 10*3/uL (ref 0.1–1.0)
Monocytes Relative: 11 %
Neutro Abs: 6.2 10*3/uL (ref 1.7–7.7)
Neutrophils Relative %: 77 %
Platelets: 264 10*3/uL (ref 150–400)
RBC: 2.61 MIL/uL — ABNORMAL LOW (ref 3.87–5.11)
RDW: 11.9 % (ref 11.5–15.5)
WBC: 8.1 10*3/uL (ref 4.0–10.5)
nRBC: 0 % (ref 0.0–0.2)

## 2019-06-23 LAB — ECHOCARDIOGRAM COMPLETE
Height: 62 in
Weight: 1600 oz

## 2019-06-23 LAB — PROCALCITONIN: Procalcitonin: 0.5 ng/mL

## 2019-06-23 LAB — MAGNESIUM: Magnesium: 1.9 mg/dL (ref 1.7–2.4)

## 2019-06-23 MED ORDER — FAMOTIDINE 20 MG PO TABS
20.0000 mg | ORAL_TABLET | Freq: Two times a day (BID) | ORAL | Status: DC
  Administered 2019-06-23 – 2019-06-24 (×2): 20 mg via ORAL
  Filled 2019-06-23 (×2): qty 1

## 2019-06-23 MED ORDER — POTASSIUM CHLORIDE 20 MEQ PO PACK
40.0000 meq | PACK | Freq: Once | ORAL | Status: AC
  Administered 2019-06-23: 40 meq via ORAL
  Filled 2019-06-23: qty 2

## 2019-06-23 MED ORDER — BISACODYL 5 MG PO TBEC: 10.0000 mg | DELAYED_RELEASE_TABLET | Freq: Once | ORAL | Status: DC

## 2019-06-23 MED ORDER — SERTRALINE HCL 50 MG PO TABS
100.0000 mg | ORAL_TABLET | Freq: Every day | ORAL | Status: DC
  Administered 2019-06-23 – 2019-06-24 (×2): 100 mg via ORAL
  Filled 2019-06-23: qty 2

## 2019-06-23 NOTE — Progress Notes (Signed)
Occupational Therapy Treatment Patient Details Name: Hailey Gibbs MRN: [id number] DOB: [birth year] Today's Date: 06/23/2019    History of present illness presented to ER secondary to fever, SOB; admitted for management of sepsis due to multifocal PNA, COPD exacerbation.  Hospital course significant for rapid response 7/12 due to severe respiratory distress, requiring transfer to CCU for BiPAP; now weaned to 4L O2 via [place].   OT comments  Pt seen for OT tx this date, eager to participate and eager to return home tomorrow. Pt educated in energy conservation strategies with emphasis on AE/DME, home/routines modifications, and work simplification to minimize SOB/falls risk during ADL routines. Handout provided to support recall and carryover. Pt verbalized understanding and endorsed perceived benefits to modifying ADL routines and utilizing AE/DME. Pt continues to benefit from skilled OT services, [facility] (Pt adamant in refusing STR).   Follow Up Recommendations  Home health OT    Equipment Recommendations  None recommended by OT    Recommendations for Other Services      Precautions / Restrictions Precautions Precautions: Fall Restrictions Weight Bearing Restrictions: No       Mobility    Balance                             ADL either performed or assessed with clinical judgement   ADL                                               Vision       Perception     Praxis      Cognition Arousal/Alertness: Awake/alert Behavior During Therapy: WFL for tasks assessed/performed Overall Cognitive Status: Within Functional Limits for tasks assessed                                          Exercises Other Exercises Other Exercises: Pt educated in energy conservation strategies with emphasis on AE/DME, home/routines modifications, and work simplification   Shoulder Instructions       General  Comments curvature of spine    Pertinent Vitals/ Pain       Pain Assessment: No/denies pain  Home Living                                          Prior Functioning/Environment              Frequency  Min 1X/week        Progress Toward Goals  OT Goals(current goals can now be found in the care plan section)  Progress towards OT goals: Progressing toward goals  Acute Rehab OT Goals Patient Stated Goal: to go home OT Goal Formulation: With patient Time For Goal Achievement: 07/06/19 Potential to Achieve Goals: Good  Plan Discharge plan remains appropriate;Frequency remains appropriate    Co-evaluation                 AM-PAC OT "6 Clicks" Daily Activity     Outcome Measure   Help from another person eating meals?: None Help from another person taking care of personal grooming?: None Help from another person  toileting, which includes using toliet, bedpan, or urinal?: A Little Help from another person bathing (including washing, rinsing, drying)?: A Little Help from another person to put on and taking off regular upper body clothing?: None Help from another person to put on and taking off regular lower body clothing?: A Little 6 Click Score: 21    End of Session    OT Visit Diagnosis: Other abnormalities of gait and mobility (R26.89);History of falling (Z91.81);Muscle weakness (generalized) (M62.81)   Activity Tolerance Patient tolerated treatment well   Patient Left in chair;with call bell/phone within reach;with nursing/sitter in room   Nurse Communication          Time: 8115-7262 OT Time Calculation (min): 8 min  Charges: OT General Charges $OT Visit: 1 Visit OT Treatments $Self Care/Home Management : 8-22 mins  Jeni Salles, MPH, MS, OTR/L ascom [phone] 06/23/19, 4:20 PM

## 2019-06-23 NOTE — Progress Notes (Signed)
[facility] [place] [facility]   PATIENT NAME: Hailey Gibbs    MR#:  [id number]  DATE OF BIRTH:  1946/01/01  SUBJECTIVE:  CHIEF COMPLAINT:   Chief Complaint  Patient presents with  . Abdominal Pain   Came with shortness of breath, found her pneumonia and COPD. Was very hypoxic and transferred to ICU with BiPAP.  Improved now and back on nasal cannula oxygen. REVIEW OF SYSTEMS:  CONSTITUTIONAL: No fever, fatigue or weakness.  EYES: No blurred or double vision.  EARS, NOSE, AND THROAT: No tinnitus or ear pain.  RESPIRATORY: No cough,have shortness of breath, wheezing ,no hemoptysis.  CARDIOVASCULAR: No chest pain, orthopnea, edema.  GASTROINTESTINAL: No nausea, vomiting, diarrhea or abdominal pain.  GENITOURINARY: No dysuria, hematuria.  ENDOCRINE: No polyuria, nocturia,  HEMATOLOGY: No anemia, easy bruising or bleeding SKIN: No rash or lesion. MUSCULOSKELETAL: No joint pain or arthritis.   NEUROLOGIC: No tingling, numbness, weakness.  PSYCHIATRY: No anxiety or depression.   ROS  DRUG ALLERGIES:   Allergies  Allergen Reactions  . Codeine Anxiety         VITALS:  Blood pressure (!) 151/75, pulse 98, temperature 98.6 F (37 C), temperature source Oral, resp. rate (!) 22, height 5\' 2"  (1.575 m), weight 45.4 kg, SpO2 99 %.  PHYSICAL EXAMINATION:  GENERAL:  73 y.o.-year-old patient lying in the bed with no acute distress.  EYES: Pupils equal, round, reactive to light and accommodation. No scleral icterus. Extraocular muscles intact.  HEENT: Head atraumatic, normocephalic. Oropharynx and nasopharynx clear.  NECK:  Supple, no jugular venous distention. No thyroid enlargement, no tenderness.  LUNGS: Normal breath sounds bilaterally, some wheezing, no crepitation. No for use of accessory muscles of respiration.  CARDIOVASCULAR: S1, S2 normal. No murmurs, rubs, or gallops.  ABDOMEN: Soft, nontender, nondistended. Bowel sounds present. No organomegaly or  mass.  EXTREMITIES: No pedal edema, cyanosis, or clubbing.  NEUROLOGIC: Cranial nerves II through XII are intact. Muscle strength 4/5 in all extremities. Sensation intact. Gait not checked.  PSYCHIATRIC: The patient is alert and oriented x 3.  SKIN: No obvious rash, lesion, or ulcer.   Physical Exam LABORATORY PANEL:   CBC Recent Labs  Lab 06/23/19 0410  WBC 8.1  HGB 8.3*  HCT 26.6*  PLT 264   ------------------------------------------------------------------------------------------------------------------  Chemistries  Recent Labs  Lab 06/22/19 0402 06/23/19 0410  NA 139 138  K 3.8 3.5  CL 85* 89*  CO2 46* 42*  GLUCOSE 108* 120*  BUN 17 24*  CREATININE 0.47 0.48  CALCIUM 9.6 9.3  MG  --  1.9  AST 25  --   ALT 19  --   ALKPHOS 74  --   BILITOT 0.5  --    ------------------------------------------------------------------------------------------------------------------  Cardiac Enzymes No results for input(s): TROPONINI in the last 168 hours. ------------------------------------------------------------------------------------------------------------------  RADIOLOGY:  Dg Chest Port 1 View  Result Date: 06/22/2019 CLINICAL DATA:  73 year old female with respiratory failure EXAM: PORTABLE CHEST 1 VIEW COMPARISON:  Prior chest x-ray 06/21/2019 FINDINGS: The mediastinal and cardiac silhouette remain unchanged. Calcifications present in the transverse aorta. Slight interval increase in small to moderate layering right pleural effusion. Extensive bilateral interstitial prominence and chronic bronchitic changes. No pneumothorax. Remote healed right-sided rib fractures. No acute osseous abnormality. IMPRESSION: 1. Interval increase in small to moderate layering right pleural effusion with an associated increase in right basilar airspace opacity which may reflect atelectasis and/or infiltrate. 2. Stable background bronchitic changes and chronic interstitial prominence. 3.  Aortic  Atherosclerosis (ICD10-170.0) Electronically Signed   By: Jacqulynn Cadet M.D.   On: 06/22/2019 08:18    ASSESSMENT AND PLAN:   Active Problems:   COPD exacerbation ([place])   Community acquired pneumonia   Advanced care planning/counseling discussion   Goals of care, counseling/discussion  1.  Clinical sepsis with multifocal pneumonia, tachypnea and tachycardia.   Zithromax.  Follow-up cultures.  2.  Acute on chronic hypoxic respiratory failure due to COPD exacerbation.  Start Solu-Medrol and give nebulizer treatments since COVID-19 testing negative.  3.  Acute on chronic respiratory failure.  Patient chronically on 2 L of oxygen    Had worsening respiratory status and transferred to ICU 06/21/19- with BiPAP use. After steroids and Lasix dose with nebulizer treatment patient improved and came off the BiPAP now on nasal cannula.  4.  Kyphoscoliosis 5.  Hypertension on Norvasc 6.  Hyperlipidemia unspecified on Lipitor 7.  Depression on Zoloft 8.  Anxiety- oral Xanax as needed.   All the records are reviewed and case discussed with Care Management/Social Workerr. Management plans discussed with the patient, family and they are in agreement.  CODE STATUS: Full.  TOTAL TIME TAKING CARE OF THIS PATIENT: 35 minutes.  PT suggested SNF but patient is requesting to go home with hospice.  POSSIBLE D/C IN 1-2 DAYS, DEPENDING ON CLINICAL CONDITION.   Vaughan Basta M.D on 06/23/2019   Between 7am to 6pm - Pager - [phone]  After 6pm go to [email or web address] - password [email or web address] [facility]  Office  [phone]  CC: Primary care physician; Margo Common, PA  Note: This dictation was prepared with Dragon dictation along with smaller phrase technology. Any transcriptional errors that result from this process are unintentional.

## 2019-06-23 NOTE — TOC Initial Note (Signed)
Transition of Care [facility](TOC) - Initial/Assessment Note    Patient Details  Name: Hailey Gibbs Kirsten MRN: [id number] Date of Birth: 1946-04-03  Transition of Care [facility](TOC) CM/SW Contact:    York SpanielMonica Rayshawn Visconti, LCSW Phone Number: 06/23/2019, 2:19 PM  Clinical Narrative:    CSW spoke with patient this afternoon regarding discharge planning. Unlike previously stated in ICU rounds, patient does not wish for hospice at home. She wishes for home health and states "I have a lot longer than 6 months to live and I am not in any hurry to die." CSW explained the differences between hospice and home health and patient clearly states she wishes for home health. Patient does not have a preference as to which home health agency. Home health referral given to [place] [facility] and rolling [facility]er to be provided by [facility]. PT had recommended short term rehab but patient is declining and only wanting to return home. She states her son will transport her home when time. She will require a portable for transport home so son will need to bring one from the house.                Expected Discharge Plan: Home w Home Health Services Barriers to Discharge: No Barriers Identified   Patient Goals and CMS Choice Patient states their goals for this hospitalization and ongoing recovery are:: go home and play on her computer CMS Medicare.gov Compare Post Acute Care list provided to:: Patient Choice offered to / list presented to : Patient  Expected Discharge Plan and Services Expected Discharge Plan: Home w Home Health Services     Post Acute Care Choice: Home Health, Durable Medical Equipment Living arrangements for the past 2 months: Single Family Home                 DME Arranged: Walker rolling(rolling walker) DME Agency: [facility] Date DME Agency Contacted: 06/23/19   Representative spoke with at DME Agency: Nida BoatmanBrad with [facility] HH Arranged: RN, PT HH [facility]: [facility] Date [facility] Contacted:  06/23/19 Time HH Agency Contacted: 1418 [facility]: [place]  Prior Living Arrangements/Services Living arrangements for the past 2 months: Single Family Home Lives with:: Self Patient language and need for interpreter reviewed:: Yes Do you feel safe going back to the place where you live?: Yes      Need for Family Participation in Patient Care: No (Comment) Care giver support system in place?: Yes (comment) Current home services: DME(oxygen through [facility]) Criminal Activity/Legal Involvement Pertinent to Current Situation/Hospitalization: No - Comment as needed  Activities of Daily Living Home Assistive Devices/Equipment: None ADL Screening (condition at time of admission) Patient's cognitive ability adequate to safely complete daily activities?: No Is the patient deaf or have difficulty hearing?: No Does the patient have difficulty seeing, even when wearing glasses/contacts?: No Does the patient have difficulty concentrating, remembering, or making decisions?: No Patient able to express need for assistance with ADLs?: Yes Does the patient have difficulty dressing or bathing?: No Independently performs ADLs?: Yes (appropriate for developmental age) Does the patient have difficulty walking or climbing stairs?: No Weakness of Legs: Both Weakness of Arms/Hands: None  Permission Sought/Granted                  Emotional Assessment Appearance:: Appears stated age Attitude/Demeanor/Rapport: (pleasant and cooperative)   Orientation: : Oriented to Self, Oriented to Place, Oriented to  Time, Oriented to Situation Alcohol / Substance Use: Not Applicable Psych Involvement: No (  comment)  Admission diagnosis:  COPD with acute exacerbation ([place]) [J44.1] Community acquired pneumonia, unspecified laterality [J18.9] Sepsis, due to unspecified organism, unspecified whether acute organ dysfunction present [facility]) [A41.9] Patient Active Problem List   Diagnosis  Date Noted  . Community acquired pneumonia   . Advanced care planning/counseling discussion   . Goals of care, counseling/discussion   . Chronic respiratory failure with hypoxia, on home O2 therapy ([place]) 04/23/2019  . Osteoporosis 09/01/2018  . Acute respiratory failure ([place])   . Palliative care by specialist   . UTI (urinary tract infection) 01/17/2018  . Sepsis ([place]) 11/07/2016  . COPD exacerbation ([place]) 10/26/2016  . Abnormal chest sounds 08/29/2015  . Obstructive chronic bronchitis with exacerbation ([place]) 08/29/2015  . Anxiety and depression 08/29/2015  . BP (high blood pressure) 08/29/2015  . Carotid stenosis 06/23/2015  . DNR (do not resuscitate) discussion 05/30/2015  . Tobacco use 05/30/2015  . Hyperlipidemia    PCP:  Margo Common, PA Pharmacy:   [id number] [place], [place] [address] Phone: [phone] Fax: [phone]     Social Determinants of Health (SDOH) Interventions    Readmission Risk Interventions No flowsheet data found.

## 2019-06-23 NOTE — Progress Notes (Signed)
Name: Hailey Gibbs MRN: [id number] DOB: 19-Feb-1946     CONSULTATION DATE: 06/19/2019  CHIEF COMPLAINT: Acute respiratory failure secondary to COPD exacerbation.   HISTORY OF PRESENT ILLNESS:  73 year old female with history of severe COPD with dependence on O2, current everyday smoker, carotid artery stenosis, anemia, HTN, depression seen today for management of COPD exacerbation and acute on chronic hypoxemic/hypercarbic respiratory failure. Patient presented to ED on 7/10 with RUQ abdominal pain, and patient was brought to ICU after being in respiratory distress. CXR indicated consolidations in all three lobes of the right lung, and left lower lung fields. Patient was then put on BIPAP after O2 saturation was at 87% on RA. Patient has been using BiPAP as needed since admission to ICU. Patient was started on nebulizer, methyprednisolone and furosemide with improvement in respiratory symptoms. Last required BiPAP AM on 7/13 . She then was started on 6 L O2 via [place], which she tolerated well. Patient is now on 2L O2 via [place]. She notes that her breathing is unchanged, but improved from admission, and denies cough. She endorses palpipitations when dyspneic. Her appetite has improved slightly. She denies chest pain, orthopnea, dizziness, syncope, fevers or chills.   PAST MEDICAL HISTORY :   has a past medical history of Abnormal chest sounds (08/29/2015), Acute respiratory failure ([place]), Anemia, Anxiety, BP (high blood pressure) (08/29/2015), Carotid artery occlusion, Carotid stenosis (06/23/2015), COPD (chronic obstructive pulmonary disease) ([place]), COPD exacerbation ([place]) (10/26/2016), Coronary artery disease, Depression, Dysrhythmia, GERD (gastroesophageal reflux disease), History of orthopnea, HOH (hard of hearing), Hyperlipidemia, Hypertension, Obstructive chronic bronchitis with exacerbation ([place]) (08/29/2015), Osteoporosis, Oxygen dependent, Palliative care by specialist, Palpitations, Seasonal  allergies, and Shortness of breath dyspnea.  has a past surgical history that includes Vaginal hysterectomy; Tubal ligation; Endarterectomy (Left, 06/23/2015); Cataract extraction w/PHACO (Left, 11/04/2018); Cataract extraction w/PHACO (Right, 11/25/2018); and Cataract extraction, bilateral (Bilateral). Prior to Admission medications   Medication Sig Start Date End Date Taking? Authorizing Provider  acetaminophen (TYLENOL) 500 MG tablet Take 1,000 mg by mouth every 6 (six) hours as needed for mild pain or headache.    Yes [provider]  albuterol (VENTOLIN HFA) 108 (90 Base) MCG/ACT inhaler TAKE 2 PUFFS BY MOUTH EVERY 6 HOURS AS NEEDED FOR WHEEZE OR SHORTNESS OF BREATH Patient taking differently: Inhale 2 puffs into the lungs every 6 (six) hours as needed for wheezing or shortness of breath.  03/31/19  Yes Chrismon, Vickki Muff, PA  amLODipine (NORVASC) 10 MG tablet TAKE 1 TABLET BY MOUTH EVERY DAY Patient taking differently: Take 10 mg by mouth daily.  06/01/19  Yes Chrismon, Vickki Muff, PA  aspirin 81 MG chewable tablet Chew 81 mg by mouth at bedtime.    Yes [provider]  atorvastatin (LIPITOR) 10 MG tablet Take 1 tablet (10 mg total) by mouth at bedtime. 06/01/19  Yes Chrismon, Vickki Muff, PA  bisacodyl (BISACODYL) 5 MG EC tablet Take 5 mg by mouth daily as needed for moderate constipation.   Yes [provider]  calcium carbonate (OSCAL) 1500 (600 Ca) MG TABS tablet Take 600 mg of elemental calcium by mouth 2 (two) times daily.    Yes [provider]  cetirizine (ZYRTEC) 10 MG tablet Take 10 mg by mouth at bedtime.    Yes [provider]  Cholecalciferol (VITAMIN D3) 2000 units TABS Take 2,000 Units by mouth at bedtime.    Yes [provider]  ferrous sulfate 325 (65 FE) MG tablet Take 325 mg  by mouth at bedtime.    Yes [provider]  HYDROcodone-acetaminophen (NORCO/VICODIN) 5-325 MG tablet Take 1 tablet by mouth 2 (two) times daily as  needed for moderate pain.   Yes [provider]  ipratropium-albuterol (DUONEB) 0.5-2.5 (3) MG/3ML SOLN Take 3 mLs by nebulization every 6 (six) hours as needed. 12/10/18  Yes Houston SirenSainani, Vivek J, MD  Multiple Vitamin (MULTIVITAMIN) tablet Take 1 tablet by mouth at bedtime.    Yes [provider]  sertraline (ZOLOFT) 100 MG tablet Take 100 mg by mouth daily.   Yes [provider]  sodium chloride (OCEAN) 0.65 % SOLN nasal spray Place 1 spray into both nostrils as needed (dryness).   Yes [provider]  SUPER B COMPLEX/C PO Take 1 tablet by mouth at bedtime.   Yes [provider]  SYMBICORT 160-4.5 MCG/ACT inhaler INHALE 2 PUFFS INTO THE LUNGS 2 (TWO) TIMES DAILY. 03/23/19  Yes Chrismon, Jodell Ciproennis E, PA  tiotropium (SPIRIVA) 18 MCG inhalation capsule Place 18 mcg into inhaler and inhale daily.   Yes [provider]  vitamin E 400 UNIT capsule Take 400 Units by mouth at bedtime.    Yes [provider]  alendronate (FOSAMAX) 70 MG tablet TAKE 1 TABLET BY MOUTH EVERY 7 DAYS. TAKE WITH A FULL GLASS OF WATER ON AN EMPTY STOMACH. Patient not taking: Reported on 06/19/2019 06/05/19   Chrismon, Jodell Ciproennis E, PA  traZODone (DESYREL) 50 MG tablet Take 0.5-1 tablets (25-50 mg total) by mouth at bedtime as needed for sleep. Patient not taking: Reported on 04/29/2019 01/08/19   Chrismon, Jodell Ciproennis E, PA   Allergies  Allergen Reactions   Codeine Anxiety         REVIEW OF SYSTEMS:    Gen:  Denies  fever, sweats, chills, dizziness, lightheadedness EENT: Denies rhinorrhea, congestion.  Cardiac:  No dizziness, chest pain or heaviness, chest tightness, edema, No JVD Resp:  Endorses shortness of breath and wheezing. No cough, orthopnea.  Gi: Denies stomach pain, nausea or vomiting, diarrhea, constipation, bowel incontinence Gu:  Denies bladder incontinence, burning urine. Ext:   Denies swelling, pain. Skin: Denies  skin rash, easy bruising or bleeding or  hives Endoc:  Denies polyuria, polydipsia , polyphagia or weight change Psych:  Endorses anxiety. Denies depression, insomnia or hallucinations  Other:  All other systems negative  VITAL SIGNS: Temp:  [97.5 F (36.4 C)-98 F (36.7 C)] 97.5 F (36.4 C) (07/14 0200) Pulse Rate:  [92-124] 92 (07/14 0600) Resp:  [11-33] 14 (07/14 0600) BP: (118-143)/(55-69) 129/60 (07/14 0600) SpO2:  [91 %-100 %] 91 % (07/14 0819)   I/O last 3 completed shifts: In: 1812.3 [P.O.:1080; I.V.:118.4; Other:330; IV Piggyback:283.9] Out: 1825 [Urine:1825] No intake/output data recorded.   SpO2: 91 % O2 Flow Rate (L/min): 2 L/min FiO2 (%): 50 %   Physical Examination:  GENERAL: critically ill appearing, cachetic, non-toxic, notable increased work of breathing. HEAD: Normocephalic, atraumatic.  EYES: Pupils equal, round, reactive to light.  No scleral icterus.  MOUTH: Moist mucosal membrane. NECK: No JVD.  PULMONARY: + rales, +wheezing at bases.  CARDIOVASCULAR: S1 and S2. Tachycardic, with regular rhythm. No murmurs, no rubs, or gallops.  GASTROINTESTINAL: Soft, nontender, non- distended. No masses. Positive bowel sounds. MUSCULOSKELETAL: No swelling, or edema.  EXTREMITIES: No erythema, unilateral calf swelling, cording, tenderness. Negative Homan's sign.  NEUROLOGIC: Alert and oriented to time place and person. Motor function grossly intact. SKIN: intact,warm,dry, unable to assess capillary refill due to nail and toe polish.  I personally reviewed lab work that was obtained in last 24 hrs.  MEDICATIONS: I have reviewed all medications and confirmed regimen as documented  CULTURE RESULTS   Recent Results (from the past 240 hour(s))  SARS Coronavirus 2 (CEPHEID- Performed [facility]), Hosp Order     Status: None   Collection Time: 06/19/19  5:18 PM   Specimen: Nasopharyngeal Swab  Result Value Ref Range Status   SARS Coronavirus 2 NEGATIVE NEGATIVE Final    Comment:  (NOTE) If result is NEGATIVE SARS-CoV-2 target nucleic acids are NOT DETECTED. The SARS-CoV-2 RNA is generally detectable in upper and lower  respiratory specimens during the acute phase of infection. The lowest  concentration of SARS-CoV-2 viral copies this assay can detect is 250  copies / mL. A negative result does not preclude SARS-CoV-2 infection  and should not be used as the sole basis for treatment or other  patient management decisions.  A negative result may occur with  improper specimen collection / handling, submission of specimen other  than nasopharyngeal swab, presence of viral mutation(s) within the  areas targeted by this assay, and inadequate number of viral copies  (<250 copies / mL). A negative result must be combined with clinical  observations, patient history, and epidemiological information. If result is POSITIVE SARS-CoV-2 target nucleic acids are DETECTED. The SARS-CoV-2 RNA is generally detectable in upper and lower  respiratory specimens dur ing the acute phase of infection.  Positive  results are indicative of active infection with SARS-CoV-2.  Clinical  correlation with patient history and other diagnostic information is  necessary to determine patient infection status.  Positive results do  not rule out bacterial infection or co-infection with other viruses. If result is PRESUMPTIVE POSTIVE SARS-CoV-2 nucleic acids MAY BE PRESENT.   A presumptive positive result was obtained on the submitted specimen  and confirmed on repeat testing.  While 2019 novel coronavirus  (SARS-CoV-2) nucleic acids may be present in the submitted sample  additional confirmatory testing may be necessary for epidemiological  and / or clinical management purposes  to differentiate between  SARS-CoV-2 and other Sarbecovirus currently known to infect humans.  If clinically indicated additional testing with an alternate test  methodology [phone]([id number]) is advised. The SARS-CoV-2 RNA is  generally  detectable in upper and lower respiratory sp ecimens during the acute  phase of infection. The expected result is Negative. Fact Sheet for Patients:  BoilerBrush.com.cyhttps://www.fda.gov/media/136312/download Fact Sheet for Healthcare Providers: https://pope.com/https://www.fda.gov/media/136313/download This test is not yet approved or cleared by the [place] FDA and has been authorized for detection and/or diagnosis of SARS-CoV-2 by FDA under an Emergency Use Authorization (EUA).  This EUA will remain in effect (meaning this test can be used) for the duration of the COVID-19 declaration under Section 564(b)(1) of the Act, 21 U.S.C. section 360bbb-3(b)(1), unless the authorization is terminated or revoked sooner. Performed at [facility], [address] [place], [place] [address]   Blood Culture (routine x 2)     Status: None (Preliminary result)   Collection Time: 06/19/19  6:07 PM   Specimen: BLOOD  Result Value Ref Range Status   Specimen Description BLOOD L WRIST  Final   Special Requests   Final    BOTTLES DRAWN AEROBIC AND ANAEROBIC Blood Culture adequate volume   Culture   Final    NO GROWTH 4 DAYS Performed at [facility], [address] [place], [place] [address]    Report Status PENDING  Incomplete  Blood Culture (routine x 2)     Status: None (Preliminary result)   Collection Time: 06/19/19  6:07 PM   Specimen: BLOOD  Result Value Ref Range Status   Specimen Description BLOOD R ARM  Final   Special Requests   Final    BOTTLES DRAWN AEROBIC AND ANAEROBIC Blood Culture adequate volume   Culture   Final    NO GROWTH 4 DAYS Performed at [facility], [address] [place], [place] [address]    Report Status PENDING  Incomplete  MRSA PCR Screening     Status: None   Collection Time: 06/21/19  8:12 AM   Specimen: Nasal Mucosa; Nasopharyngeal  Result Value Ref Range Status   MRSA by PCR NEGATIVE NEGATIVE Final    Comment:        The GeneXpert MRSA  Assay (FDA approved for NASAL specimens only), is one component of a comprehensive MRSA colonization surveillance program. It is not intended to diagnose MRSA infection nor to guide or monitor treatment for MRSA infections. Performed at [facility], [address] [place], [place] [address]           Indwelling Urinary Catheter continued, requirement due to   Reason to continue Indwelling Urinary Catheter strict Intake/Output monitoring for hemodynamic instability     ASSESSMENT AND PLAN SYNOPSIS   COPD Exacerbation  - Patient stable with no evidence of acute respiratory distress. - Continue O2 via [place] and BiPAP PRN. Patient currently at 5L O2 via [place].  - Continue nebulizer treatments, methylprednisolone.   - Continue Azithromycin for bacterial infection prophylaxis in the setting of COPD exacerbation. Cultures with no growth.  - D/c Acetazolomide -  - continue Bronchodilator Therapy  CARDIAC ICU monitoring. ECHO scheduled this morning. Results pending. Continue hydrazaline PRN for SBP > 170.  Palliative care: - Patient status DNR/DNI - Morphine PRN for anxiety and/or air hunger. - Continue palliative care management.  GI GI PROPHYLAXIS as indicated  NUTRITIONAL STATUS DIET-->CLD and advance as tolertaed Constipation protocol as indicated   ENDO - will use ICU hypoglycemic\Hyperglycemia protocol if needed.   ELECTROLYTES -follow labs as needed -replace as needed -pharmacy consultation and following   DVT/GI PRX ordered TRANSFUSIONS AS NEEDED MONITOR FSBS ASSESS the need for LABS as needed  Ok to transfer to gen med floor   Qiara Minetti Santiago Gladavid Grenda Lora, M.D.  [facility]  Medical Director [facility] Medical Director [facility] Cardio-Pulmonary Department

## 2019-06-23 NOTE — Progress Notes (Signed)
*  PRELIMINARY RESULTS* Echocardiogram 2D Echocardiogram has been performed.  Hailey Gibbs 06/23/2019, 10:05 AM

## 2019-06-23 NOTE — Plan of Care (Signed)
Pt has remained off bipap all night and been weaned from 4L to 2L [place]. Slept most of shift after bedtime meds. No complaints outside of some anxiety at times. Anxious to go home.

## 2019-06-23 NOTE — Progress Notes (Signed)
Ch visited w/ pt to complete an OR for AD. Pt shard that she was given the document and would look over it at a later time. Pt's son visits on a daily. Ch provided space for pt to share her life story and how content she is with her current status. Pt has only one living family member (son). Pt loss a son at 88 and her ex-husband also has passed several years ago. Ch provided a compassionate presence and asked guided questions as pt shared about how depressing the news is right now and the current state that the country is in due to CV-19 restrictions. Ch informed pt that she is awaiting a room to transition out of ICU. Pt understood. Pt was appreciative of visit. Ch remained until received a phone call.  F/U with family recommended.  Goal: communicate with palliative care to determine pt's GOC/TOC   06/23/19 1200  Clinical Encounter Type  Visited With Patient  Visit Type Spiritual support;Social support;Other (Comment) (AD)  Referral From Nurse  Consult/Referral To Chaplain  Spiritual Encounters  Spiritual Needs Emotional;Grief support  Stress Factors  Patient Stress Factors Family relationships;Health changes;Loss of control;Loss;Major life changes  Family Stress Factors Loss;Major life changes  Advance Directives (For Healthcare)  Would patient like information on creating a medical advance directive? Yes (Inpatient - patient defers creating a medical advance directive at this time - Information given)

## 2019-06-23 NOTE — Progress Notes (Signed)
Physical Therapy Treatment Patient Details Name: Hailey Gibbs MRN: [id number] DOB: July 21, 1946 Today's Date: 06/23/2019    History of Present Illness presented to ER secondary to fever, SOB; admitted for management of sepsis due to multifocal PNA, COPD exacerbation.  Hospital course significant for rapid response 7/12 due to severe respiratory distress, requiring transfer to CCU for BiPAP; now weaned to 4L O2 via [place].    PT Comments    Patient alert, agreeable to PT, eager to mobilize out of bed once discovered pure wik was not working properly. Pt demonstrated supine to sit with HOB elevated with supervision with several attempts. Pt was able to maintain seated EOB with GOOD balance, spO2 monitored throughout in bed and sitting EOB >9% on 5L. Sit <> stand from bed with RW and minA, from recliner with CGA with use of recliner arms. Pt then ambulated ~72ft with RW and CGA, HR in 110s and spO2>92% on 4L. Pt occasionally cued for proper RW management and slight impulsivity, though WOB decreased from prior session. Pt up in recliner requesting a bath, all needs in reach. RN notified of mobility status and pt request. The patient would benefit from further skilled PT intervention to continue to progress towards goals. Current recommendation remains appropriate.     Follow Up Recommendations  SNF     Equipment Recommendations  Rolling walker with 5" wheels    Recommendations for Other Services       Precautions / Restrictions Precautions Precautions: Fall Restrictions Weight Bearing Restrictions: No    Mobility  Bed Mobility Overal bed mobility: Needs Assistance Bed Mobility: Supine to Sit     Supine to sit: Supervision     General bed mobility comments: several attempts, pt able to complete with supervision by 4th attempt  Transfers Overall transfer level: Needs assistance Equipment used: Rolling walker (2 wheeled) Transfers: Sit to/from Stand Sit to Stand: Min guard;Min  assist         General transfer comment: minA from bed, CGA from recliner with use of recliner arms  Ambulation/Gait Ambulation/Gait assistance: Min guard Gait Distance (Feet): 70 Feet Assistive device: Rolling walker (2 wheeled)       General Gait Details: reciprocal stepping pattern with forward trunk flexion, RW arms length anterior to patient; slightly impulsive. on 4L via [place], pt with less SOB noted this session   Stairs             Wheelchair Mobility    Modified Rankin (Stroke Patients Only)       Balance Overall balance assessment: Needs assistance Sitting-balance support: No upper extremity supported;Feet supported Sitting balance-Leahy Scale: Good     Standing balance support: Bilateral upper extremity supported Standing balance-Leahy Scale: Fair                              Cognition Arousal/Alertness: Awake/alert Behavior During Therapy: WFL for tasks assessed/performed Overall Cognitive Status: Within Functional Limits for tasks assessed                                        Exercises      General Comments General comments (skin integrity, edema, etc.): curvature of spine      Pertinent Vitals/Pain Pain Assessment: No/denies pain    Home Living  Prior Function            PT Goals (current goals can now be found in the care plan section) Progress towards PT goals: Progressing toward goals    Frequency    Min 2X/week      PT Plan Current plan remains appropriate    Co-evaluation              AM-PAC PT "6 Clicks" Mobility   Outcome Measure  Help needed turning from your back to your side while in a flat bed without using bedrails?: None Help needed moving from lying on your back to sitting on the side of a flat bed without using bedrails?: None Help needed moving to and from a bed to a chair (including a wheelchair)?: A Little Help needed standing up from a  chair using your arms (e.g., wheelchair or bedside chair)?: A Little Help needed to walk in hospital room?: A Little Help needed climbing 3-5 steps with a railing? : A Little 6 Click Score: 20    End of Session Equipment Utilized During Treatment: Gait belt;Oxygen(4-5L) Activity Tolerance: Patient tolerated treatment well Patient left: in chair;with chair alarm set;with call bell/phone within reach Nurse Communication: Mobility status PT Visit Diagnosis: Muscle weakness (generalized) (M62.81);Difficulty in walking, not elsewhere classified (R26.2)     Time: [id number] PT Time Calculation (min) (ACUTE ONLY): 25 min  Charges:  $Therapeutic Exercise: 23-37 mins                     Olga Coasteriana Mick Tanguma PT, DPT 3:35 PM,06/23/19 [phone]

## 2019-06-24 ENCOUNTER — Telehealth: Payer: Self-pay

## 2019-06-24 LAB — GLUCOSE, CAPILLARY: Glucose-Capillary: 123 mg/dL — ABNORMAL HIGH (ref 70–99)

## 2019-06-24 LAB — BASIC METABOLIC PANEL
Anion gap: 6 (ref 5–15)
BUN: 19 mg/dL (ref 8–23)
CO2: 41 mmol/L — ABNORMAL HIGH (ref 22–32)
Calcium: 9.5 mg/dL (ref 8.9–10.3)
Chloride: 91 mmol/L — ABNORMAL LOW (ref 98–111)
Creatinine, Ser: 0.52 mg/dL (ref 0.44–1.00)
GFR calc Af Amer: >60 mL/min (ref >60)
GFR calc non Af Amer: >60 mL/min (ref >60)
Glucose, Bld: 121 mg/dL — ABNORMAL HIGH (ref 70–99)
Potassium: 4.6 mmol/L (ref 3.5–5.1)
Sodium: 138 mmol/L (ref 135–145)

## 2019-06-24 LAB — CULTURE, BLOOD (ROUTINE X 2)
Culture: NO GROWTH
Culture: NO GROWTH
Special Requests: ADEQUATE
Special Requests: ADEQUATE

## 2019-06-24 LAB — MAGNESIUM: Magnesium: 1.9 mg/dL (ref 1.7–2.4)

## 2019-06-24 MED ORDER — PREDNISONE 10 MG (21) PO TBPK: ORAL_TABLET | ORAL | 0 refills | Status: AC

## 2019-06-24 MED ORDER — ALPRAZOLAM 0.25 MG PO TABS: 0.2500 mg | ORAL_TABLET | Freq: Three times a day (TID) | ORAL | 0 refills | Status: AC | PRN

## 2019-06-24 MED ORDER — ENSURE ENLIVE PO LIQD: 237.0000 mL | Freq: Two times a day (BID) | ORAL | 12 refills | Status: AC

## 2019-06-24 NOTE — Telephone Encounter (Signed)
[facility] requesting palliative care order.

## 2019-06-24 NOTE — Progress Notes (Signed)
New referral for outpatient Palliative to follow at home received from [place]. Patient to discharge home today, will be followed by [facility] home health. Patient information faxed to referral. Flo Shanks BSN, RN, [place] [phone]

## 2019-06-24 NOTE — Progress Notes (Signed)
Bedside spirometry performed. FEV1 is 18.8% of predicted and FVC is 36.1% of predicted. The pt gave good effort.

## 2019-06-24 NOTE — Clinical Social Work Note (Cosign Needed)
Pt continues to exhibit signs of hypercapnia associated with chronic respiratory failure secondary to severe COPD. Patient requires the use of NIV both QHS and daytime to help with eacerbation periods. The use of the NIV will treat patient's high PCO2 levels and can reduce risk of exacerbations and future hospitalizations when used at night and during the day. Patient will need these advanced settings in conjunction with her current medication regimen; Bipap is not an option due to its functional limtiations and the severity of the patient's condition. Failure to have NIV available for use over a 24 hour period could lead to death.

## 2019-06-24 NOTE — Progress Notes (Addendum)
Daily Progress Note   Patient Name: Hailey Gibbs       Date: 06/24/2019 DOB: 1946/03/12  Age: 73 y.o. MRN#: [id number] Attending Physician: Vaughan Basta, * Primary Care Physician: Margo Common, PA Admit Date: 06/19/2019  Reason for Consultation/Follow-up: Establishing goals of care  Subjective: Patient in bed. Awake, alert. States she was told that she did not need Hospice. We discussed how hospice was a benefit she could receive based on the stage of her illness and her desire to remain at home, however, it was always her choice. Offered Palliative services at home in addition to home health services and patient agreed.  Attempted to discussed advanced care planning based on patient's illness state and high risk that she will decline once she goes home, however, patient declined.  ROS  Length of Stay: 5  Current Medications: Scheduled Meds:  . aspirin  81 mg Oral QHS  . bisacodyl  10 mg Oral Once  . budesonide (PULMICORT) nebulizer solution  0.25 mg Nebulization BID  . calcium carbonate  600 mg of elemental calcium Oral BID WC  . Chlorhexidine Gluconate Cloth  6 each Topical Q0600  . cholecalciferol  2,000 Units Oral QHS  . enoxaparin (LOVENOX) injection  40 mg Subcutaneous Q24H  . famotidine  20 mg Oral BID  . feeding supplement (ENSURE ENLIVE)  237 mL Oral BID BM  . ferrous sulfate  325 mg Oral QHS  . folic acid  1 mg Intravenous Daily  . ipratropium-albuterol  3 mL Nebulization Q4H  . mouth rinse  15 mL Mouth Rinse BID  . methylPREDNISolone (SOLU-MEDROL) injection  40 mg Intravenous Q12H  . multivitamin with minerals  1 tablet Oral QHS  . sertraline  100 mg Oral Daily    Continuous Infusions: . sodium chloride Stopped (06/22/19 0916)  . azithromycin  Stopped (06/23/19 1135)    PRN Meds: acetaminophen **OR** acetaminophen, ALPRAZolam, bisacodyl, hydrALAZINE, HYDROcodone-acetaminophen, morphine, morphine injection, [DISCONTINUED] ondansetron **OR** ondansetron (ZOFRAN) IV, sodium chloride  Physical Exam Vitals signs and nursing note reviewed.  Constitutional:      Comments: cachetic  Pulmonary:     Comments: SOB Neurological:     Mental Status: She is alert.             Vital Signs: BP 140/70   Pulse (!) 109  Temp (!) 96.7 F (35.9 C) (Axillary)   Resp (!) 21   Ht 5\' 2"  (1.575 m)   Wt 45.4 kg   SpO2 94%   BMI 18.29 kg/m  SpO2: SpO2: 94 % O2 Device: O2 Device: Nasal Cannula O2 Flow Rate: O2 Flow Rate (L/min): 2 L/min  Intake/output summary:   Intake/Output Summary (Last 24 hours) at 06/24/2019 0954 Last data filed at 06/24/2019 0800 Gross per 24 hour  Intake 240 ml  Output 350 ml  Net -110 ml   LBM: Last BM Date: (PTA) Baseline Weight: Weight: 45.4 kg Most recent weight: Weight: 45.4 kg       Palliative Assessment/Data: PPS: 40%      Patient Active Problem List   Diagnosis Date Noted  . Community acquired pneumonia   . Advanced care planning/counseling discussion   . Goals of care, counseling/discussion   . Chronic respiratory failure with hypoxia, on home O2 therapy (HCC) 04/23/2019  . Osteoporosis 09/01/2018  . Acute respiratory failure (HCC)   . Palliative care by specialist   . UTI (urinary tract infection) 01/17/2018  . Sepsis (HCC) 11/07/2016  . COPD exacerbation (HCC) 10/26/2016  . Abnormal chest sounds 08/29/2015  . Obstructive chronic bronchitis with exacerbation (HCC) 08/29/2015  . Anxiety and depression 08/29/2015  . BP (high blood pressure) 08/29/2015  . Carotid stenosis 06/23/2015  . DNR (do not resuscitate) discussion 05/30/2015  . Tobacco use 05/30/2015  . Hyperlipidemia     Palliative Care Assessment & Plan   Patient Profile: 73 y.o. female  with past medical history of  severe COPD on home O2, HTN, anxiety, anemia, GERD admitted on 06/19/2019 with COPD exacerbation and pneumonia. She has required bipap during admission. Now on her home O2 requirement. Palliative medicine consulted for GOC.    Assessment/Recommendations/Plan   D/C home with Palliative  Continue morphine sublingual 2.5mg  q2hr prn for SOB- recommend as premed before activity to increase activity tolerance  Care mgmt consult for Palliative services at home  Goals of Care and Additional Recommendations:  Limitations on Scope of Treatment: Full Scope Treatment  Code Status:  DNR  Prognosis:   < 6 months  Discharge Planning:  Home with Palliative Services  Care plan was discussed with patient.  Thank you for allowing the Palliative Medicine Team to assist in the care of this patient.   Time In: 0925 Time Out: 1000 Total Time 35 mins Prolonged Time Billed no      Greater than 50%  of this time was spent counseling and coordinating care related to the above assessment and plan.  Ocie BobKasie , AGNP-C Palliative Medicine   Please contact Palliative Medicine Team phone at [phone] for questions and concerns.

## 2019-06-24 NOTE — TOC Progression Note (Signed)
Transition of Care [facility]) - Progression Note    Patient Details  Name: Hailey Gibbs MRN: [id number] Date of Birth: 28-Dec-1945  Transition of [facility] CM/SW Contact  Shela Leff, [place] Phone Number: 06/24/2019, 2:13 PM  Clinical Narrative:   CSW provided patient a rolling walker today from [employer]. CSW contacted [place] at [facility] to inform that patient was discharging today as well. The ICU intensivist agreed that patient would benefit from an NIV and a pulmonary function test will be performed prior to patient discharging. If patient qualifies, an NIV will be arranged after patient has discharged.    Expected Discharge Plan: [place] Barriers to Discharge: No Barriers Identified  Expected Discharge Plan and Services Expected Discharge Plan: [place] Choice: Home Health, Durable Medical Equipment Living arrangements for the past 2 months: Single Family Home Expected Discharge Date: 06/24/19               DME Arranged: Gilford Rile rolling(rolling walker) DME Agency: [employer] Date DME Agency Contacted: 06/23/19   Representative spoke with at DME Agency: Leroy Sea with [employer] HH Arranged: RN, PT [place] Agency: [place] Date [place]: 06/23/19 Time [place]: [id number] Representative spoke with at [place]: [place] ([place]) Interventions    Readmission Risk Interventions No flowsheet data found.

## 2019-06-24 NOTE — Telephone Encounter (Signed)
Proceed with palliative care order with [facility] as recommended by internal medicine at discharge on 06-19-19 due to Respiratory Failure with COPD and Wasting Syndrome/Failure to Thrive.

## 2019-06-24 NOTE — Progress Notes (Signed)
Patient has severe end stage COPD with FEV1<50%     Patient continues to exhibit signs of hypercapnia associated with chronic respiratory failure secondary to severe end-stage COPD.    Patient is able to clear secretions and protect airway.  Patient requires the use of NIV both nightly and daytime to help with exacerbation periods. The use of NIV will treat patient's high PCO2 levels and can reduce the risk of exacerbations in future hospitalizations when used at night and during the day.  Patient will need these advanced settings in conjunction with his current medication regimen :BiPAP with backup rate has been tried and failed.  Failure to have NIV available for use over 24  Could lead to severe respiratory failure and cardiaca arrest and death

## 2019-06-24 NOTE — Discharge Summary (Signed)
[facility] NAME: Hailey Gibbs    MR#:  [id number]  DATE OF BIRTH:  [birth year]  DATE OF ADMISSION:  06/19/2019 ADMITTING PHYSICIAN: Loletha Grayer, MD  DATE OF DISCHARGE: 06/24/2019   PRIMARY CARE PHYSICIAN: Chrismon, Vickki Muff, PA    ADMISSION DIAGNOSIS:  COPD with acute exacerbation ([place]) [J44.1] Community acquired pneumonia, unspecified laterality [J18.9] Sepsis, due to unspecified organism, unspecified whether acute organ dysfunction present ([place]) [A41.9]  DISCHARGE DIAGNOSIS:  Active Problems:   COPD exacerbation ([place])   Community acquired pneumonia   Advanced care planning/counseling discussion   Goals of care, counseling/discussion   SECONDARY DIAGNOSIS:   Past Medical History:  Diagnosis Date  . Abnormal chest sounds 08/29/2015  . Acute respiratory failure ([place])   . Anemia   . Anxiety   . BP (high blood pressure) 08/29/2015  . Carotid artery occlusion   . Carotid stenosis 06/23/2015  . COPD (chronic obstructive pulmonary disease) ([place])   . COPD exacerbation ([place]) 10/26/2016  . Coronary artery disease   . Depression   . Dysrhythmia   . GERD (gastroesophageal reflux disease)   . History of orthopnea   . HOH (hard of hearing)   . Hyperlipidemia   . Hypertension   . Obstructive chronic bronchitis with exacerbation ([place]) 08/29/2015  . Osteoporosis   . Oxygen dependent    2L/MIN CONTINUOUS  . Palliative care by specialist   . Palpitations   . Seasonal allergies   . Shortness of breath dyspnea     HOSPITAL COURSE:   1.Clinical sepsis with multifocal pneumonia, tachypnea and tachycardia.  Zithromax. Follow-up cultures.negative.  2. Acute on chronic hypoxic respiratory failure due toCOPD exacerbation. Start Solu-Medrol and give nebulizer treatments since COVID-19 testing negative.  3.Acute on chronic respiratory failure. Patient chronically on 2 L of oxygen    Had worsening respiratory  status and transferred to ICU 06/21/19- with BiPAP use. After steroids and Lasix dose with nebulizer treatment patient improved and came off the BiPAP now on nasal cannula.  Back to baseline 2 ltr oxygen, sis PFT to check if qualify for NIV.  4. Kyphoscoliosis 5. Hypertension on Norvasc 6. Hyperlipidemia unspecified on Lipitor 7. Depression on Zoloft 8.  Anxiety- oral Xanax as needed.   DISCHARGE CONDITIONS:   Stable.  CONSULTS OBTAINED:  Treatment Team:  Vaughan Basta, MD [facility], [facility], MD  DRUG ALLERGIES:   Allergies  Allergen Reactions  . Codeine Anxiety         DISCHARGE MEDICATIONS:   Allergies as of 06/24/2019      Reactions   Codeine Anxiety          Medication List    STOP taking these medications   alendronate 70 MG tablet Commonly known as: FOSAMAX   amLODipine 10 MG tablet Commonly known as: NORVASC   traZODone 50 MG tablet Commonly known as: DESYREL     TAKE these medications   acetaminophen 500 MG tablet Commonly known as: TYLENOL Take 1,000 mg by mouth every 6 (six) hours as needed for mild pain or headache.   albuterol 108 (90 Base) MCG/ACT inhaler Commonly known as: VENTOLIN HFA TAKE 2 PUFFS BY MOUTH EVERY 6 HOURS AS NEEDED FOR WHEEZE OR SHORTNESS OF BREATH What changed: See the new instructions.   ALPRAZolam 0.25 MG tablet Commonly known as: XANAX Take 1 tablet (0.25 mg total) by mouth 3 (three) times daily as needed for anxiety.   aspirin 81 MG chewable tablet Chew 81  mg by mouth at bedtime.   atorvastatin 10 MG tablet Commonly known as: LIPITOR Take 1 tablet (10 mg total) by mouth at bedtime.   bisacodyl 5 MG EC tablet Generic drug: bisacodyl Take 5 mg by mouth daily as needed for moderate constipation.   calcium carbonate 1500 (600 Ca) MG Tabs tablet Commonly known as: OSCAL Take 600 mg of elemental calcium by mouth 2 (two) times daily.   cetirizine 10 MG tablet Commonly known as: ZYRTEC Take 10 mg  by mouth at bedtime.   feeding supplement (ENSURE ENLIVE) Liqd Take 237 mLs by mouth 2 (two) times daily between meals.   ferrous sulfate 325 (65 FE) MG tablet Take 325 mg by mouth at bedtime.   HYDROcodone-acetaminophen 5-325 MG tablet Commonly known as: NORCO/VICODIN Take 1 tablet by mouth 2 (two) times daily as needed for moderate pain.   ipratropium-albuterol 0.5-2.5 (3) MG/3ML Soln Commonly known as: DUONEB Take 3 mLs by nebulization every 6 (six) hours as needed.   multivitamin tablet Take 1 tablet by mouth at bedtime.   predniSONE 10 MG (21) Tbpk tablet Commonly known as: STERAPRED UNI-PAK 21 TAB Take 6 tabs first day, 5 tab on day 2, then 4 on day 3rd, 3 tabs on day 4th , 2 tab on day 5th, and 1 tab on 6th day.   sertraline 100 MG tablet Commonly known as: ZOLOFT Take 100 mg by mouth daily.   sodium chloride 0.65 % Soln nasal spray Commonly known as: OCEAN Place 1 spray into both nostrils as needed (dryness).   SUPER B COMPLEX/C PO Take 1 tablet by mouth at bedtime.   Symbicort 160-4.5 MCG/ACT inhaler Generic drug: budesonide-formoterol INHALE 2 PUFFS INTO THE LUNGS 2 (TWO) TIMES DAILY.   tiotropium 18 MCG inhalation capsule Commonly known as: SPIRIVA Place 18 mcg into inhaler and inhale daily.   Vitamin D3 50 MCG (2000 UT) Tabs Take 2,000 Units by mouth at bedtime.   vitamin E 400 UNIT capsule Take 400 Units by mouth at bedtime.            Durable Medical Equipment  (From admission, onward)         Start     Ordered   06/23/19 1747  For home use only DME Walker rolling  Once    Question:  Patient needs a walker to treat with the following condition  Answer:  Weakness   06/23/19 1747           DISCHARGE INSTRUCTIONS:    Follow with PMD in 1-2 weeks.  If you experience worsening of your admission symptoms, develop shortness of breath, life threatening emergency, suicidal or homicidal thoughts you must seek medical attention immediately  by calling 911 or calling your MD immediately  if symptoms less severe.  You Must read complete instructions/literature along with all the possible adverse reactions/side effects for all the Medicines you take and that have been prescribed to you. Take any new Medicines after you have completely understood and accept all the possible adverse reactions/side effects.   Please note  You were cared for by a hospitalist during your hospital stay. If you have any questions about your discharge medications or the care you received while you were in the hospital after you are discharged, you can call the unit and asked to speak with the hospitalist on call if the hospitalist that took care of you is not available. Once you are discharged, your primary care physician will handle any further medical issues.  Please note that NO REFILLS for any discharge medications will be authorized once you are discharged, as it is imperative that you return to your primary care physician (or establish a relationship with a primary care physician if you do not have one) for your aftercare needs so that they can reassess your need for medications and monitor your lab values.    Today   CHIEF COMPLAINT:   Chief Complaint  Patient presents with  . Abdominal Pain    HISTORY OF PRESENT ILLNESS:  Hailey Gibbs  is a 73 y.o. female coming in feeling bad.  She thought her gallbladder was acting up.  She states her COPD is acting up.  States they diagnosed me with pneumonia here in the emergency room.  She has not been eating.  She gets sick when she does eat.  She is not feeling well.  She has had a 25 pound weight loss over 6 weeks.  She is feeling tired.  She is coughing up greenish phlegm and she is short of breath.  She is wheezing.  Hospitalist services were contacted for further evaluation.  VITAL SIGNS:  Blood pressure 131/61, pulse (!) 112, temperature (!) 96.7 F (35.9 C), temperature source Axillary, resp. rate  19, height 5\' 2"  (1.575 m), weight 45.4 kg, SpO2 92 %.  I/O:    Intake/Output Summary (Last 24 hours) at 06/24/2019 1457 Last data filed at 06/24/2019 1334 Gross per 24 hour  Intake 480 ml  Output 350 ml  Net 130 ml    PHYSICAL EXAMINATION:  GENERAL:  73 y.o.-year-old patient lying in the bed with no acute distress.  EYES: Pupils equal, round, reactive to light and accommodation. No scleral icterus. Extraocular muscles intact.  HEENT: Head atraumatic, normocephalic. Oropharynx and nasopharynx clear.  NECK:  Supple, no jugular venous distention. No thyroid enlargement, no tenderness.  LUNGS: Normal breath sounds bilaterally, some wheezing, no crepitation. No for use of accessory muscles of respiration.  CARDIOVASCULAR: S1, S2 normal. No murmurs, rubs, or gallops.  ABDOMEN: Soft, nontender, nondistended. Bowel sounds present. No organomegaly or mass.  EXTREMITIES: No pedal edema, cyanosis, or clubbing.  NEUROLOGIC: Cranial nerves II through XII are intact. Muscle strength 4/5 in all extremities. Sensation intact. Gait not checked.  PSYCHIATRIC: The patient is alert and oriented x 3.  SKIN: No obvious rash, lesion, or ulcer.    DATA REVIEW:   CBC Recent Labs  Lab 06/23/19 0410  WBC 8.1  HGB 8.3*  HCT 26.6*  PLT 264    Chemistries  Recent Labs  Lab 06/22/19 0402  06/24/19 0306  NA 139   < > 138  K 3.8   < > 4.6  CL 85*   < > 91*  CO2 46*   < > 41*  GLUCOSE 108*   < > 121*  BUN 17   < > 19  CREATININE 0.47   < > 0.52  CALCIUM 9.6   < > 9.5  MG  --    < > 1.9  AST 25  --   --   ALT 19  --   --   ALKPHOS 74  --   --   BILITOT 0.5  --   --    < > = values in this interval not displayed.    Cardiac Enzymes No results for input(s): TROPONINI in the last 168 hours.  Microbiology Results  Results for orders placed or performed during the hospital encounter of 06/19/19  SARS Coronavirus 2 (CEPHEID-  Performed in [facility] lab), Hosp Order     Status: None    Collection Time: 06/19/19  5:18 PM   Specimen: Nasopharyngeal Swab  Result Value Ref Range Status   SARS Coronavirus 2 NEGATIVE NEGATIVE Final    Comment: (NOTE) If result is NEGATIVE SARS-CoV-2 target nucleic acids are NOT DETECTED. The SARS-CoV-2 RNA is generally detectable in upper and lower  respiratory specimens during the acute phase of infection. The lowest  concentration of SARS-CoV-2 viral copies this assay can detect is 250  copies / mL. A negative result does not preclude SARS-CoV-2 infection  and should not be used as the sole basis for treatment or other  patient management decisions.  A negative result may occur with  improper specimen collection / handling, submission of specimen other  than nasopharyngeal swab, presence of viral mutation(s) within the  areas targeted by this assay, and inadequate number of viral copies  (<250 copies / mL). A negative result must be combined with clinical  observations, patient history, and epidemiological information. If result is POSITIVE SARS-CoV-2 target nucleic acids are DETECTED. The SARS-CoV-2 RNA is generally detectable in upper and lower  respiratory specimens dur ing the acute phase of infection.  Positive  results are indicative of active infection with SARS-CoV-2.  Clinical  correlation with patient history and other diagnostic information is  necessary to determine patient infection status.  Positive results do  not rule out bacterial infection or co-infection with other viruses. If result is PRESUMPTIVE POSTIVE SARS-CoV-2 nucleic acids MAY BE PRESENT.   A presumptive positive result was obtained on the submitted specimen  and confirmed on repeat testing.  While 2019 novel coronavirus  (SARS-CoV-2) nucleic acids may be present in the submitted sample  additional confirmatory testing may be necessary for epidemiological  and / or clinical management purposes  to differentiate between  SARS-CoV-2 and other Sarbecovirus  currently known to infect humans.  If clinically indicated additional testing with an alternate test  methodology [phone]([id number]) is advised. The SARS-CoV-2 RNA is generally  detectable in upper and lower respiratory sp ecimens during the acute  phase of infection. The expected result is Negative. Fact Sheet for Patients:  BoilerBrush.com.cyhttps://www.fda.gov/media/136312/download Fact Sheet for Healthcare Providers: https://pope.com/https://www.fda.gov/media/136313/download This test is not yet approved or cleared by the [place] FDA and has been authorized for detection and/or diagnosis of SARS-CoV-2 by FDA under an Emergency Use Authorization (EUA).  This EUA will remain in effect (meaning this test can be used) for the duration of the COVID-19 declaration under Section 564(b)(1) of the Act, 21 U.S.C. section 360bbb-3(b)(1), unless the authorization is terminated or revoked sooner. Performed at [facility], [address] [place], [place] [address]   Blood Culture (routine x 2)     Status: None   Collection Time: 06/19/19  6:07 PM   Specimen: BLOOD  Result Value Ref Range Status   Specimen Description BLOOD L WRIST  Final   Special Requests   Final    BOTTLES DRAWN AEROBIC AND ANAEROBIC Blood Culture adequate volume   Culture   Final    NO GROWTH 5 DAYS Performed at [facility], [address] [place], [place] [address]    Report Status 06/24/2019 FINAL  Final  Blood Culture (routine x 2)     Status: None   Collection Time: 06/19/19  6:07 PM   Specimen: BLOOD  Result Value Ref Range Status   Specimen Description BLOOD R ARM  Final   Special  Requests   Final    BOTTLES DRAWN AEROBIC AND ANAEROBIC Blood Culture adequate volume   Culture   Final    NO GROWTH 5 DAYS Performed at [facility], [address] [place], [place] [address]    Report Status 06/24/2019 FINAL  Final  MRSA PCR Screening     Status: None   Collection Time: 06/21/19  8:12 AM   Specimen: Nasal Mucosa;  Nasopharyngeal  Result Value Ref Range Status   MRSA by PCR NEGATIVE NEGATIVE Final    Comment:        The GeneXpert MRSA Assay (FDA approved for NASAL specimens only), is one component of a comprehensive MRSA colonization surveillance program. It is not intended to diagnose MRSA infection nor to guide or monitor treatment for MRSA infections. Performed at [facility], [address] [place], [place] [address]     RADIOLOGY:  No results found.  EKG:   Orders placed or performed during the hospital encounter of 06/19/19  . ED EKG  . ED EKG  . EKG 12-Lead  . EKG 12-Lead  . EKG 12-Lead  . EKG 12-Lead      Management plans discussed with the patient, family and they are in agreement.  CODE STATUS: DNR    Code Status Orders  (From admission, onward)         Start     Ordered   06/22/19 1623  Do not attempt resuscitation (DNR)  Continuous    Question Answer Comment  In the event of cardiac or respiratory ARREST Do not call a "code blue"   In the event of cardiac or respiratory ARREST Do not perform Intubation, CPR, defibrillation or ACLS   In the event of cardiac or respiratory ARREST Use medication by any route, position, wound care, and other measures to relive pain and suffering. May use oxygen, suction and manual treatment of airway obstruction as needed for comfort.      06/22/19 1622        Code Status History    Date Active Date Inactive Code Status Order ID Comments User Context   06/19/2019 1904 06/22/2019 1622 Full Code [id number]  Alford HighlandWieting, Richard, MD ED   12/08/2018 1730 12/10/2018 1658 Full Code [id number]  Alford HighlandWieting, Richard, MD ED   01/18/2018 0047 01/21/2018 1635 Full Code [id number]  Oralia ManisWillis, David, MD Inpatient   12/18/2017 1458 12/23/2017 1759 Full Code [id number]  Ramonita LabGouru, Aruna, MD Inpatient   11/07/2016 1427 11/10/2016 1535 Full Code [id number]  Enedina FinnerPatel, Sona, MD Inpatient   06/23/2015 1759 06/24/2015 2036 Full Code [id number]  Annice Needyew, Jason S, MD Inpatient    Advance Care Planning Activity      TOTAL TIME TAKING CARE OF THIS PATIENT: 35 minutes.    Altamese DillingVaibhavkumar Graycee Greeson M.D on 06/24/2019 at 2:57 PM  Between 7am to 6pm - Pager - [phone]  After 6pm go to [email or web address] - password [email or web address]  [facility]  Office  [phone]  CC: Primary care physician; Tamsen Roershrismon, Dennis E, PA   Note: This dictation was prepared with Dragon dictation along with smaller phrase technology. Any transcriptional errors that result from this process are unintentional.

## 2019-06-25 ENCOUNTER — Telehealth: Payer: Self-pay

## 2019-06-25 ENCOUNTER — Telehealth: Payer: Self-pay | Admitting: Nurse Practitioner

## 2019-06-25 DIAGNOSIS — G629 Polyneuropathy, unspecified: Secondary | ICD-10-CM | POA: Diagnosis not present

## 2019-06-25 DIAGNOSIS — D649 Anemia, unspecified: Secondary | ICD-10-CM | POA: Diagnosis not present

## 2019-06-25 DIAGNOSIS — F419 Anxiety disorder, unspecified: Secondary | ICD-10-CM | POA: Diagnosis not present

## 2019-06-25 DIAGNOSIS — M810 Age-related osteoporosis without current pathological fracture: Secondary | ICD-10-CM | POA: Diagnosis not present

## 2019-06-25 DIAGNOSIS — R2689 Other abnormalities of gait and mobility: Secondary | ICD-10-CM | POA: Diagnosis not present

## 2019-06-25 DIAGNOSIS — I10 Essential (primary) hypertension: Secondary | ICD-10-CM | POA: Diagnosis not present

## 2019-06-25 DIAGNOSIS — J9611 Chronic respiratory failure with hypoxia: Secondary | ICD-10-CM | POA: Diagnosis not present

## 2019-06-25 DIAGNOSIS — I2510 Atherosclerotic heart disease of native coronary artery without angina pectoris: Secondary | ICD-10-CM | POA: Diagnosis not present

## 2019-06-25 DIAGNOSIS — J441 Chronic obstructive pulmonary disease with (acute) exacerbation: Secondary | ICD-10-CM | POA: Diagnosis not present

## 2019-06-25 NOTE — Telephone Encounter (Signed)
I have made the 1st attempt to contact the patient or family member in charge, in order to follow up from recently being discharged from the hospital. I left a message on voicemail but I will make another attempt at a different time.  

## 2019-06-25 NOTE — Telephone Encounter (Signed)
Done

## 2019-06-25 NOTE — Telephone Encounter (Signed)
Called patient to schedule Palliative Consult and son Gwyndolyn Saxon answered the phone.  He stated that patient was suppose to have home health as well and wanted to know the difference.  I explained the differences between the two and answered all questions to his satisfaction.  He then explained this to the patient while I was on the phone and patient agreed to Palliative services.  We scheduled a Telephone Palliative consult for 06/29/19 @ 2 PM.

## 2019-06-29 ENCOUNTER — Other Ambulatory Visit: Payer: Self-pay

## 2019-06-29 ENCOUNTER — Telehealth: Payer: Self-pay

## 2019-06-29 ENCOUNTER — Encounter: Payer: Self-pay | Admitting: Nurse Practitioner

## 2019-06-29 ENCOUNTER — Other Ambulatory Visit: Payer: Medicare HMO | Admitting: Nurse Practitioner

## 2019-06-29 DIAGNOSIS — F419 Anxiety disorder, unspecified: Secondary | ICD-10-CM | POA: Diagnosis not present

## 2019-06-29 DIAGNOSIS — Z515 Encounter for palliative care: Secondary | ICD-10-CM | POA: Diagnosis not present

## 2019-06-29 DIAGNOSIS — R531 Weakness: Secondary | ICD-10-CM | POA: Diagnosis not present

## 2019-06-29 DIAGNOSIS — J441 Chronic obstructive pulmonary disease with (acute) exacerbation: Secondary | ICD-10-CM | POA: Diagnosis not present

## 2019-06-29 DIAGNOSIS — J9611 Chronic respiratory failure with hypoxia: Secondary | ICD-10-CM | POA: Diagnosis not present

## 2019-06-29 DIAGNOSIS — I2510 Atherosclerotic heart disease of native coronary artery without angina pectoris: Secondary | ICD-10-CM | POA: Diagnosis not present

## 2019-06-29 DIAGNOSIS — I10 Essential (primary) hypertension: Secondary | ICD-10-CM | POA: Diagnosis not present

## 2019-06-29 DIAGNOSIS — R2689 Other abnormalities of gait and mobility: Secondary | ICD-10-CM | POA: Diagnosis not present

## 2019-06-29 DIAGNOSIS — R0602 Shortness of breath: Secondary | ICD-10-CM | POA: Insufficient documentation

## 2019-06-29 DIAGNOSIS — D649 Anemia, unspecified: Secondary | ICD-10-CM | POA: Diagnosis not present

## 2019-06-29 DIAGNOSIS — G629 Polyneuropathy, unspecified: Secondary | ICD-10-CM | POA: Diagnosis not present

## 2019-06-29 DIAGNOSIS — M810 Age-related osteoporosis without current pathological fracture: Secondary | ICD-10-CM | POA: Diagnosis not present

## 2019-06-29 NOTE — Patient Outreach (Signed)
[facility]) Care Management  06/29/2019  Hailey Gibbs [birth year] [id number]   EMMI- General Discharge RED ON EMMI ALERT Day # 1 Date: 06/26/2019 Red Alert Reason:  Know who to call about changes in condition? No  Unfilled prescriptions? Yes  Able to fill today/tomorrow? No     Outreach attempt: Spoke with patient. She is able to verify HIPAA. She states she has been feeling much better since being at home and even doing some house cleaning.  Advised patient to take breaks and not to over do things. She verbalized understanding.  Addressed red alerts with patient. She states that she is aware to call MD for any changes and if emergency to go to the hospital.  Patient states she has all her medications.  She states that her son has picked them all up and she is taking as prescribed. Patient states that her son is around to help if she needs.  Patient states she has home health and that someone is going to come today. Discussed with patient COPD worsening symptoms and advised her to seek medical attention for any worsening. She verbalized understanding and declines any further support or needs at this time.     Plan: RN CM will close case.   Jone Baseman, RN, MSN [facility] Care Management Care Management Coordinator Direct Line [phone] Toll Free: [phone]  Fax: [phone]

## 2019-06-29 NOTE — Progress Notes (Signed)
[facility], [facility] Consult Note Telephone: [phone]) [phone]  Fax: [phone]  PATIENT NAME: Hailey Gibbs Nickle DOB: Apr 22, 1946 MRN: [id number]  PRIMARY CARE PROVIDER:   Tamsen Roershrismon, Dennis E, PA  REFERRING PROVIDER:  Tamsen Roershrismon, Dennis E, PA [address] [place],  [place] [id number]  RESPONSIBLE PARTY:   Self  Due to the COVID-19 crisis, this visit was done via telemedicine from my office and it was initiated and consent by this patient and or family.  I was asked by Dr Shella Spearinghrismon to complete a Palliative consult for [facility].   RECOMMENDATIONS and PLAN:  1. Palliative care encounter Z51.5; Palliative medicine team will continue to support patient, patient's family, and medical team. Visit consisted of counseling and education dealing with the complex and emotionally intense issues of symptom management and palliative care in the setting of serious and potentially life-threatening illness  2. Dyspneic R06.00 secondary COPD to remain stable at present time  3. Generalized weakness R53.1  secondary to COPD, hospitalization continue with therapy as able. Encourage energy conservation and rest times. ASSESSMENT:     I call Ms Darlin DropFeaster for schedule palliative care visit. Ms Darlin DropFeaster in verbal agreement. We talked about how she's been feeling since she's returned from the hospital. She showed that she's doing much better. She talked about waiting too long when she gets sick to go to the doctor and then she ends up in the hospital. She shared that this has happened multiple times now. We talked about troubleshooting and notifying providers early when she become symptomatic. She was an agreement to trying this new plan. We talked about past medical history in the saying of chronic disease. We talked about recent hospitalization. We talked about her functional level where she does live at home on her own. She is receiving Home Health Services, therapy and they're starting  today from her understanding. We talked about her discussion with palliative during hospitalization concerning hospice. We talked about what hospice does provide under Medicare benefit.  We talked about her expectations of therapy goals she is hoping to accomplish. We talked about symptoms of pain which she currently is not experiencing. We talked about shortness of breath which is much improved. We talked about energy conservation, resting and self care. We talked about medical goals of care including aggressive versus conservative versus comfort care. We talked about DNR which she currently is. Ms Darlin DropFeaster endorses her son wanted but after further discussion about CPR she shared that that is also what she would like she does not want to be resuscitated. Asked if it was okay to complete a at a facility [facility] DNR form and put it in [place]Vyn[place] also mail to her. Ms Darlin DropFeaster was an agreement. We talked about role of palliative care and plan of care. Therapeutic listening and emotional support provided. Contact information provided. Questions answered the satisfaction. Discussed will follow up in 4 weeks if needed or sooner should she declined.  I spent 45 minutes providing this consultation,  from 2:00pm to 2:45pm. More than 50% of the time in this consultation was spent coordinating communication.   HISTORY OF PRESENT ILLNESS:  Hailey Gibbs Lamons is a 73 y.o. year old female with multiple medical problems includingCOPD, oxygen dependent, coronary artery disease, anemia, asthma,  carotid stenosis, hypertension, hyperlipidemia, hard of hearing, GERD, anxiety, depression, seasonal allergies, vaginal hysterectomy, tubal ligation, enterectomy, bilateral cataracts, tobacco abuse. She was hospitalised 7 / 10 / 2020 to 7 / 15 / 2020 for COPD  with acute exacerbation, multifocal community-acquired pneumonia. For which she received antibiotic therapy. Acute on chronic hypoxic respiratory failure secondary to COPD  exacerbation started on steroids and nebulizer treatments, covid-19 is. She did require ICU with BiPAP though was weaned off back down to 2L/[place] baseline. Kyphoscoliosis. She is on Zoloft for depression. Anxiety she does control with Xanax. She was seen by palliative care during hospitalization and is a DNR. Hospice Services were discussed but she opted to be discharged home with home health and palliative care. Palliative Care was asked to help address goals of care.   CODE STATUS: DNR  PPS: 60% HOSPICE ELIGIBILITY/DIAGNOSIS: TBD  PAST MEDICAL HISTORY:  Past Medical History:  Diagnosis Date  . Abnormal chest sounds 08/29/2015  . Acute respiratory failure ([place])   . Anemia   . Anxiety   . BP (high blood pressure) 08/29/2015  . Carotid artery occlusion   . Carotid stenosis 06/23/2015  . COPD (chronic obstructive pulmonary disease) ([place])   . COPD exacerbation ([place]) 10/26/2016  . Coronary artery disease   . Depression   . Dysrhythmia   . GERD (gastroesophageal reflux disease)   . History of orthopnea   . HOH (hard of hearing)   . Hyperlipidemia   . Hypertension   . Obstructive chronic bronchitis with exacerbation ([place]) 08/29/2015  . Osteoporosis   . Oxygen dependent    2L/MIN CONTINUOUS  . Palliative care by specialist   . Palpitations   . Seasonal allergies   . Shortness of breath dyspnea     SOCIAL HX:  Social History   Tobacco Use  . Smoking status: Current Every Day Smoker    Packs/day: 1.50    Years: 55.00    Pack years: 82.50    Types: Cigarettes  . Smokeless tobacco: Never Used  Substance Use Topics  . Alcohol use: No    Alcohol/week: 0.0 standard drinks    ALLERGIES:  Allergies  Allergen Reactions  . Codeine Anxiety          PERTINENT MEDICATIONS:  Outpatient Encounter Medications as of 06/29/2019  Medication Sig  . acetaminophen (TYLENOL) 500 MG tablet Take 1,000 mg by mouth every 6 (six) hours as needed for mild pain or headache.   . albuterol (VENTOLIN  HFA) 108 (90 Base) MCG/ACT inhaler TAKE 2 PUFFS BY MOUTH EVERY 6 HOURS AS NEEDED FOR WHEEZE OR SHORTNESS OF BREATH (Patient taking differently: Inhale 2 puffs into the lungs every 6 (six) hours as needed for wheezing or shortness of breath. )  . ALPRAZolam (XANAX) 0.25 MG tablet Take 1 tablet (0.25 mg total) by mouth 3 (three) times daily as needed for anxiety.  Marland Kitchen aspirin 81 MG chewable tablet Chew 81 mg by mouth at bedtime.   Marland Kitchen atorvastatin (LIPITOR) 10 MG tablet Take 1 tablet (10 mg total) by mouth at bedtime.  . bisacodyl (BISACODYL) 5 MG EC tablet Take 5 mg by mouth daily as needed for moderate constipation.  . calcium carbonate (OSCAL) 1500 (600 Ca) MG TABS tablet Take 600 mg of elemental calcium by mouth 2 (two) times daily.   . cetirizine (ZYRTEC) 10 MG tablet Take 10 mg by mouth at bedtime.   . Cholecalciferol (VITAMIN D3) 2000 units TABS Take 2,000 Units by mouth at bedtime.   . feeding supplement, ENSURE ENLIVE, (ENSURE ENLIVE) LIQD Take 237 mLs by mouth 2 (two) times daily between meals.  . ferrous sulfate 325 (65 FE) MG tablet Take 325 mg by mouth at bedtime.   Marland Kitchen HYDROcodone-acetaminophen (  NORCO/VICODIN) 5-325 MG tablet Take 1 tablet by mouth 2 (two) times daily as needed for moderate pain.  Marland Kitchen. ipratropium-albuterol (DUONEB) 0.5-2.5 (3) MG/3ML SOLN Take 3 mLs by nebulization every 6 (six) hours as needed.  . Multiple Vitamin (MULTIVITAMIN) tablet Take 1 tablet by mouth at bedtime.   . predniSONE (STERAPRED UNI-PAK 21 TAB) 10 MG (21) TBPK tablet Take 6 tabs first day, 5 tab on day 2, then 4 on day 3rd, 3 tabs on day 4th , 2 tab on day 5th, and 1 tab on 6th day.  . sertraline (ZOLOFT) 100 MG tablet Take 100 mg by mouth daily.  . sodium chloride (OCEAN) 0.65 % SOLN nasal spray Place 1 spray into both nostrils as needed (dryness).  . SUPER B COMPLEX/C PO Take 1 tablet by mouth at bedtime.  . SYMBICORT 160-4.5 MCG/ACT inhaler INHALE 2 PUFFS INTO THE LUNGS 2 (TWO) TIMES DAILY.  Marland Kitchen. tiotropium  (SPIRIVA) 18 MCG inhalation capsule Place 18 mcg into inhaler and inhale daily.  . vitamin E 400 UNIT capsule Take 400 Units by mouth at bedtime.    No facility-administered encounter medications on file as of 06/29/2019.     PHYSICAL EXAM:   Deferred  Christin Z Gusler, NP

## 2019-06-30 DIAGNOSIS — R2689 Other abnormalities of gait and mobility: Secondary | ICD-10-CM | POA: Diagnosis not present

## 2019-06-30 DIAGNOSIS — J441 Chronic obstructive pulmonary disease with (acute) exacerbation: Secondary | ICD-10-CM | POA: Diagnosis not present

## 2019-06-30 DIAGNOSIS — D649 Anemia, unspecified: Secondary | ICD-10-CM | POA: Diagnosis not present

## 2019-06-30 DIAGNOSIS — G629 Polyneuropathy, unspecified: Secondary | ICD-10-CM | POA: Diagnosis not present

## 2019-06-30 DIAGNOSIS — M810 Age-related osteoporosis without current pathological fracture: Secondary | ICD-10-CM | POA: Diagnosis not present

## 2019-06-30 DIAGNOSIS — F419 Anxiety disorder, unspecified: Secondary | ICD-10-CM | POA: Diagnosis not present

## 2019-06-30 DIAGNOSIS — J9611 Chronic respiratory failure with hypoxia: Secondary | ICD-10-CM | POA: Diagnosis not present

## 2019-06-30 DIAGNOSIS — I2510 Atherosclerotic heart disease of native coronary artery without angina pectoris: Secondary | ICD-10-CM | POA: Diagnosis not present

## 2019-06-30 DIAGNOSIS — I10 Essential (primary) hypertension: Secondary | ICD-10-CM | POA: Diagnosis not present

## 2019-07-01 ENCOUNTER — Telehealth: Payer: Self-pay

## 2019-07-01 DIAGNOSIS — J449 Chronic obstructive pulmonary disease, unspecified: Secondary | ICD-10-CM | POA: Diagnosis not present

## 2019-07-01 NOTE — Telephone Encounter (Signed)
Palliative care orders authorized

## 2019-07-03 DIAGNOSIS — R2689 Other abnormalities of gait and mobility: Secondary | ICD-10-CM | POA: Diagnosis not present

## 2019-07-03 DIAGNOSIS — F419 Anxiety disorder, unspecified: Secondary | ICD-10-CM | POA: Diagnosis not present

## 2019-07-03 DIAGNOSIS — M810 Age-related osteoporosis without current pathological fracture: Secondary | ICD-10-CM | POA: Diagnosis not present

## 2019-07-03 DIAGNOSIS — D649 Anemia, unspecified: Secondary | ICD-10-CM | POA: Diagnosis not present

## 2019-07-03 DIAGNOSIS — I2510 Atherosclerotic heart disease of native coronary artery without angina pectoris: Secondary | ICD-10-CM | POA: Diagnosis not present

## 2019-07-03 DIAGNOSIS — I10 Essential (primary) hypertension: Secondary | ICD-10-CM | POA: Diagnosis not present

## 2019-07-03 DIAGNOSIS — J441 Chronic obstructive pulmonary disease with (acute) exacerbation: Secondary | ICD-10-CM | POA: Diagnosis not present

## 2019-07-03 DIAGNOSIS — J9611 Chronic respiratory failure with hypoxia: Secondary | ICD-10-CM | POA: Diagnosis not present

## 2019-07-03 DIAGNOSIS — G629 Polyneuropathy, unspecified: Secondary | ICD-10-CM | POA: Diagnosis not present

## 2019-07-03 NOTE — Telephone Encounter (Signed)
Opened in error

## 2019-07-07 DIAGNOSIS — F419 Anxiety disorder, unspecified: Secondary | ICD-10-CM | POA: Diagnosis not present

## 2019-07-07 DIAGNOSIS — I2510 Atherosclerotic heart disease of native coronary artery without angina pectoris: Secondary | ICD-10-CM | POA: Diagnosis not present

## 2019-07-07 DIAGNOSIS — G629 Polyneuropathy, unspecified: Secondary | ICD-10-CM | POA: Diagnosis not present

## 2019-07-07 DIAGNOSIS — J9611 Chronic respiratory failure with hypoxia: Secondary | ICD-10-CM | POA: Diagnosis not present

## 2019-07-07 DIAGNOSIS — J441 Chronic obstructive pulmonary disease with (acute) exacerbation: Secondary | ICD-10-CM | POA: Diagnosis not present

## 2019-07-07 DIAGNOSIS — I10 Essential (primary) hypertension: Secondary | ICD-10-CM | POA: Diagnosis not present

## 2019-07-07 DIAGNOSIS — M810 Age-related osteoporosis without current pathological fracture: Secondary | ICD-10-CM | POA: Diagnosis not present

## 2019-07-07 DIAGNOSIS — R2689 Other abnormalities of gait and mobility: Secondary | ICD-10-CM | POA: Diagnosis not present

## 2019-07-07 DIAGNOSIS — D649 Anemia, unspecified: Secondary | ICD-10-CM | POA: Diagnosis not present

## 2019-07-08 DIAGNOSIS — I10 Essential (primary) hypertension: Secondary | ICD-10-CM | POA: Diagnosis not present

## 2019-07-08 DIAGNOSIS — F419 Anxiety disorder, unspecified: Secondary | ICD-10-CM | POA: Diagnosis not present

## 2019-07-08 DIAGNOSIS — G629 Polyneuropathy, unspecified: Secondary | ICD-10-CM | POA: Diagnosis not present

## 2019-07-08 DIAGNOSIS — D649 Anemia, unspecified: Secondary | ICD-10-CM | POA: Diagnosis not present

## 2019-07-08 DIAGNOSIS — J9611 Chronic respiratory failure with hypoxia: Secondary | ICD-10-CM | POA: Diagnosis not present

## 2019-07-08 DIAGNOSIS — I2510 Atherosclerotic heart disease of native coronary artery without angina pectoris: Secondary | ICD-10-CM | POA: Diagnosis not present

## 2019-07-08 DIAGNOSIS — R2689 Other abnormalities of gait and mobility: Secondary | ICD-10-CM | POA: Diagnosis not present

## 2019-07-08 DIAGNOSIS — J441 Chronic obstructive pulmonary disease with (acute) exacerbation: Secondary | ICD-10-CM | POA: Diagnosis not present

## 2019-07-08 DIAGNOSIS — M810 Age-related osteoporosis without current pathological fracture: Secondary | ICD-10-CM | POA: Diagnosis not present

## 2019-07-09 DIAGNOSIS — G629 Polyneuropathy, unspecified: Secondary | ICD-10-CM | POA: Diagnosis not present

## 2019-07-09 DIAGNOSIS — F419 Anxiety disorder, unspecified: Secondary | ICD-10-CM | POA: Diagnosis not present

## 2019-07-09 DIAGNOSIS — J9611 Chronic respiratory failure with hypoxia: Secondary | ICD-10-CM | POA: Diagnosis not present

## 2019-07-09 DIAGNOSIS — R2689 Other abnormalities of gait and mobility: Secondary | ICD-10-CM | POA: Diagnosis not present

## 2019-07-09 DIAGNOSIS — I2510 Atherosclerotic heart disease of native coronary artery without angina pectoris: Secondary | ICD-10-CM | POA: Diagnosis not present

## 2019-07-09 DIAGNOSIS — D649 Anemia, unspecified: Secondary | ICD-10-CM | POA: Diagnosis not present

## 2019-07-09 DIAGNOSIS — M810 Age-related osteoporosis without current pathological fracture: Secondary | ICD-10-CM | POA: Diagnosis not present

## 2019-07-09 DIAGNOSIS — J441 Chronic obstructive pulmonary disease with (acute) exacerbation: Secondary | ICD-10-CM | POA: Diagnosis not present

## 2019-07-09 DIAGNOSIS — I10 Essential (primary) hypertension: Secondary | ICD-10-CM | POA: Diagnosis not present

## 2019-07-10 DIAGNOSIS — I2510 Atherosclerotic heart disease of native coronary artery without angina pectoris: Secondary | ICD-10-CM | POA: Diagnosis not present

## 2019-07-10 DIAGNOSIS — J441 Chronic obstructive pulmonary disease with (acute) exacerbation: Secondary | ICD-10-CM | POA: Diagnosis not present

## 2019-07-10 DIAGNOSIS — J9611 Chronic respiratory failure with hypoxia: Secondary | ICD-10-CM | POA: Diagnosis not present

## 2019-07-10 DIAGNOSIS — G629 Polyneuropathy, unspecified: Secondary | ICD-10-CM | POA: Diagnosis not present

## 2019-07-10 DIAGNOSIS — D649 Anemia, unspecified: Secondary | ICD-10-CM | POA: Diagnosis not present

## 2019-07-10 DIAGNOSIS — M810 Age-related osteoporosis without current pathological fracture: Secondary | ICD-10-CM | POA: Diagnosis not present

## 2019-07-10 DIAGNOSIS — F419 Anxiety disorder, unspecified: Secondary | ICD-10-CM | POA: Diagnosis not present

## 2019-07-10 DIAGNOSIS — I10 Essential (primary) hypertension: Secondary | ICD-10-CM | POA: Diagnosis not present

## 2019-07-10 DIAGNOSIS — R2689 Other abnormalities of gait and mobility: Secondary | ICD-10-CM | POA: Diagnosis not present

## 2019-07-11 DIAGNOSIS — J449 Chronic obstructive pulmonary disease, unspecified: Secondary | ICD-10-CM | POA: Diagnosis not present

## 2019-07-13 DIAGNOSIS — I2510 Atherosclerotic heart disease of native coronary artery without angina pectoris: Secondary | ICD-10-CM | POA: Diagnosis not present

## 2019-07-13 DIAGNOSIS — I10 Essential (primary) hypertension: Secondary | ICD-10-CM | POA: Diagnosis not present

## 2019-07-13 DIAGNOSIS — J9611 Chronic respiratory failure with hypoxia: Secondary | ICD-10-CM | POA: Diagnosis not present

## 2019-07-13 DIAGNOSIS — R2689 Other abnormalities of gait and mobility: Secondary | ICD-10-CM | POA: Diagnosis not present

## 2019-07-13 DIAGNOSIS — J441 Chronic obstructive pulmonary disease with (acute) exacerbation: Secondary | ICD-10-CM | POA: Diagnosis not present

## 2019-07-13 DIAGNOSIS — D649 Anemia, unspecified: Secondary | ICD-10-CM | POA: Diagnosis not present

## 2019-07-13 DIAGNOSIS — M810 Age-related osteoporosis without current pathological fracture: Secondary | ICD-10-CM | POA: Diagnosis not present

## 2019-07-13 DIAGNOSIS — G629 Polyneuropathy, unspecified: Secondary | ICD-10-CM | POA: Diagnosis not present

## 2019-07-13 DIAGNOSIS — F419 Anxiety disorder, unspecified: Secondary | ICD-10-CM | POA: Diagnosis not present

## 2019-07-14 ENCOUNTER — Other Ambulatory Visit: Payer: Self-pay | Admitting: Family Medicine

## 2019-07-14 ENCOUNTER — Encounter: Payer: Medicare HMO | Admitting: Family Medicine

## 2019-07-14 DIAGNOSIS — J441 Chronic obstructive pulmonary disease with (acute) exacerbation: Secondary | ICD-10-CM

## 2019-07-16 ENCOUNTER — Other Ambulatory Visit: Payer: Self-pay

## 2019-07-16 ENCOUNTER — Encounter: Payer: Self-pay | Admitting: Nurse Practitioner

## 2019-07-16 ENCOUNTER — Other Ambulatory Visit: Payer: Medicare HMO | Admitting: Nurse Practitioner

## 2019-07-16 DIAGNOSIS — R2689 Other abnormalities of gait and mobility: Secondary | ICD-10-CM | POA: Diagnosis not present

## 2019-07-16 DIAGNOSIS — D649 Anemia, unspecified: Secondary | ICD-10-CM | POA: Diagnosis not present

## 2019-07-16 DIAGNOSIS — Z515 Encounter for palliative care: Secondary | ICD-10-CM

## 2019-07-16 DIAGNOSIS — F419 Anxiety disorder, unspecified: Secondary | ICD-10-CM | POA: Diagnosis not present

## 2019-07-16 DIAGNOSIS — I10 Essential (primary) hypertension: Secondary | ICD-10-CM | POA: Diagnosis not present

## 2019-07-16 DIAGNOSIS — M810 Age-related osteoporosis without current pathological fracture: Secondary | ICD-10-CM | POA: Diagnosis not present

## 2019-07-16 DIAGNOSIS — J9611 Chronic respiratory failure with hypoxia: Secondary | ICD-10-CM | POA: Diagnosis not present

## 2019-07-16 DIAGNOSIS — J441 Chronic obstructive pulmonary disease with (acute) exacerbation: Secondary | ICD-10-CM | POA: Diagnosis not present

## 2019-07-16 DIAGNOSIS — G629 Polyneuropathy, unspecified: Secondary | ICD-10-CM | POA: Diagnosis not present

## 2019-07-16 DIAGNOSIS — I2510 Atherosclerotic heart disease of native coronary artery without angina pectoris: Secondary | ICD-10-CM | POA: Diagnosis not present

## 2019-07-16 NOTE — Progress Notes (Signed)
[facility] Consult Note Telephone: [phone]  Fax: [phone]  PATIENT NAME: Hailey Gibbs DOB: [birth year] MRN: [id number]  PRIMARY CARE PROVIDER:   Tamsen Roershrismon, Dennis E, PA  REFERRING PROVIDER:  Tamsen Roershrismon, Dennis E, PA [address] [place],  [place] [id number]  RESPONSIBLE PARTY:   Self  Due to the COVID-19 crisis, this visit was done via telemedicine from my office and it was initiated and consent by this patient and or family.   RECOMMENDATIONS and PLAN:  1. ACP: DNR completed in Epic/Vynca; will mail blank most form and hard choice book so she and her son can review and will revisit at next scheduled PC f/u visit.  2. Dyspneic; secondary COPD has improved, stable, continue inhalation therapy  3. Generalized weakness; secondary to COPD improving since hospitalization continue with therapy as able. Encourage energy conservation and rest times.  4. Palliative care encounter Z51.5; Palliative medicine team will continue to support patient, patient's family, and medical team. Visit consisted of counseling and education dealing with the complex and emotionally intense issues of symptom management and palliative care in the setting of serious and potentially life-threatening illness  I spent 40 minutes providing this consultation,  from 11:00am to 11:40am. More than 50% of the time in this consultation was spent coordinating communication.   HISTORY OF PRESENT ILLNESS:  Hailey Gibbs Brodt is a 73 y.o. year old female with multiple medical problems including COPD, oxygen dependent, coronary artery disease, anemia, asthma, carotid stenosis, hypertension, hyperlipidemia, hard of hearing, GERD, anxiety, depression, seasonal allergies, vaginal hysterectomy, tubal ligation, enterectomy, bilateral cataracts, tobacco abuse. I called Hailey Gibbs for schedule palliative care follow-up visit. Hailey Gibbs in agreement. We talked about how she was  feeling today. She verbalize that she is doing fine. She feels like she has improved since hospitalization. She continues to receive home health services including therapy. She talked about the work she's doing with therapy and has been making progress she feels like. We talked about symptoms of pain when she denies. We talked about shortness of breath what she denies. We talked about her appetite which has been improving. We talked about most form and her choice book of which she has not received. Discussed will have that resent to her for her to review and discuss with her son. Hailey Gibbs in agreement. We talked about role of palliative care and plan of care. She does continue to improve and wants to continue to treat what is treatable, continue her therapy to get stronger. She does live independently. Therapeutic listening and emotional support provided. Hailey Gibbs in agreement to continue palliative care services and schedule follow-up visit for six weeks if needed or sooner should she declined. Questions answered to satisfaction. Contact information provided. Palliative Care was asked to help to continue to address goals of care.   CODE STATUS: DNR  PPS: 60% HOSPICE ELIGIBILITY/DIAGNOSIS: TBD  PAST MEDICAL HISTORY:  Past Medical History:  Diagnosis Date  . Abnormal chest sounds 08/29/2015  . Acute respiratory failure ([facility])   . Anemia   . Anxiety   . BP (high blood pressure) 08/29/2015  . Carotid artery occlusion   . Carotid stenosis 06/23/2015  . COPD (chronic obstructive pulmonary disease) ([facility])   . COPD exacerbation ([facility]) 10/26/2016  . Coronary artery disease   . Depression   . Dysrhythmia   . GERD (gastroesophageal reflux disease)   . History of orthopnea   . HOH (hard of hearing)   .  Hyperlipidemia   . Hypertension   . Obstructive chronic bronchitis with exacerbation ([place]) 08/29/2015  . Osteoporosis   . Oxygen dependent    2L/MIN CONTINUOUS  . Palliative care by specialist   .  Palpitations   . Seasonal allergies   . Shortness of breath dyspnea     SOCIAL HX:  Social History   Tobacco Use  . Smoking status: Current Every Day Smoker    Packs/day: 1.50    Years: 55.00    Pack years: 82.50    Types: Cigarettes  . Smokeless tobacco: Never Used  Substance Use Topics  . Alcohol use: No    Alcohol/week: 0.0 standard drinks    ALLERGIES:  Allergies  Allergen Reactions  . Codeine Anxiety          PERTINENT MEDICATIONS:  Outpatient Encounter Medications as of 07/16/2019  Medication Sig  . acetaminophen (TYLENOL) 500 MG tablet Take 1,000 mg by mouth every 6 (six) hours as needed for mild pain or headache.   . ALPRAZolam (XANAX) 0.25 MG tablet Take 1 tablet (0.25 mg total) by mouth 3 (three) times daily as needed for anxiety.  Marland Kitchen aspirin 81 MG chewable tablet Chew 81 mg by mouth at bedtime.   Marland Kitchen atorvastatin (LIPITOR) 10 MG tablet Take 1 tablet (10 mg total) by mouth at bedtime.  . bisacodyl (BISACODYL) 5 MG EC tablet Take 5 mg by mouth daily as needed for moderate constipation.  . calcium carbonate (OSCAL) 1500 (600 Ca) MG TABS tablet Take 600 mg of elemental calcium by mouth 2 (two) times daily.   . cetirizine (ZYRTEC) 10 MG tablet Take 10 mg by mouth at bedtime.   . Cholecalciferol (VITAMIN D3) 2000 units TABS Take 2,000 Units by mouth at bedtime.   . feeding supplement, ENSURE ENLIVE, (ENSURE ENLIVE) LIQD Take 237 mLs by mouth 2 (two) times daily between meals.  . ferrous sulfate 325 (65 FE) MG tablet Take 325 mg by mouth at bedtime.   Marland Kitchen HYDROcodone-acetaminophen (NORCO/VICODIN) 5-325 MG tablet Take 1 tablet by mouth 2 (two) times daily as needed for moderate pain.  Marland Kitchen ipratropium-albuterol (DUONEB) 0.5-2.5 (3) MG/3ML SOLN Take 3 mLs by nebulization every 6 (six) hours as needed.  . Multiple Vitamin (MULTIVITAMIN) tablet Take 1 tablet by mouth at bedtime.   . predniSONE (STERAPRED UNI-PAK 21 TAB) 10 MG (21) TBPK tablet Take 6 tabs first day, 5 tab on day 2, then  4 on day 3rd, 3 tabs on day 4th , 2 tab on day 5th, and 1 tab on 6th day.  . sertraline (ZOLOFT) 100 MG tablet Take 100 mg by mouth daily.  . sodium chloride (OCEAN) 0.65 % SOLN nasal spray Place 1 spray into both nostrils as needed (dryness).  . SUPER B COMPLEX/C PO Take 1 tablet by mouth at bedtime.  . SYMBICORT 160-4.5 MCG/ACT inhaler INHALE 2 PUFFS INTO THE LUNGS 2 (TWO) TIMES DAILY.  Marland Kitchen tiotropium (SPIRIVA) 18 MCG inhalation capsule Place 18 mcg into inhaler and inhale daily.  . VENTOLIN HFA 108 (90 Base) MCG/ACT inhaler TAKE 2 PUFFS BY MOUTH EVERY 6 HOURS AS NEEDED FOR WHEEZE OR SHORTNESS OF BREATH  . vitamin E 400 UNIT capsule Take 400 Units by mouth at bedtime.    No facility-administered encounter medications on file as of 07/16/2019.     PHYSICAL EXAM:   Deferred  Linetta Regner Z Roma Bierlein, NP

## 2019-07-20 ENCOUNTER — Other Ambulatory Visit: Payer: Self-pay

## 2019-07-20 ENCOUNTER — Ambulatory Visit (INDEPENDENT_AMBULATORY_CARE_PROVIDER_SITE_OTHER): Payer: Medicare HMO | Admitting: Family Medicine

## 2019-07-20 VITALS — BP 146/62 | HR 77 | Temp 97.7°F | Wt 106.0 lb

## 2019-07-20 DIAGNOSIS — J441 Chronic obstructive pulmonary disease with (acute) exacerbation: Secondary | ICD-10-CM | POA: Diagnosis not present

## 2019-07-20 DIAGNOSIS — D649 Anemia, unspecified: Secondary | ICD-10-CM

## 2019-07-20 DIAGNOSIS — F419 Anxiety disorder, unspecified: Secondary | ICD-10-CM

## 2019-07-20 DIAGNOSIS — M810 Age-related osteoporosis without current pathological fracture: Secondary | ICD-10-CM | POA: Diagnosis not present

## 2019-07-20 DIAGNOSIS — R531 Weakness: Secondary | ICD-10-CM

## 2019-07-20 DIAGNOSIS — I10 Essential (primary) hypertension: Secondary | ICD-10-CM | POA: Diagnosis not present

## 2019-07-20 DIAGNOSIS — R2689 Other abnormalities of gait and mobility: Secondary | ICD-10-CM | POA: Diagnosis not present

## 2019-07-20 DIAGNOSIS — I2510 Atherosclerotic heart disease of native coronary artery without angina pectoris: Secondary | ICD-10-CM | POA: Diagnosis not present

## 2019-07-20 DIAGNOSIS — J9611 Chronic respiratory failure with hypoxia: Secondary | ICD-10-CM | POA: Diagnosis not present

## 2019-07-20 DIAGNOSIS — Z9981 Dependence on supplemental oxygen: Secondary | ICD-10-CM

## 2019-07-20 DIAGNOSIS — G629 Polyneuropathy, unspecified: Secondary | ICD-10-CM | POA: Diagnosis not present

## 2019-07-20 DIAGNOSIS — E782 Mixed hyperlipidemia: Secondary | ICD-10-CM

## 2019-07-20 DIAGNOSIS — F329 Major depressive disorder, single episode, unspecified: Secondary | ICD-10-CM | POA: Diagnosis not present

## 2019-07-20 DIAGNOSIS — F32A Depression, unspecified: Secondary | ICD-10-CM

## 2019-07-20 DIAGNOSIS — Z8701 Personal history of pneumonia (recurrent): Secondary | ICD-10-CM | POA: Diagnosis not present

## 2019-07-20 NOTE — Progress Notes (Signed)
Hailey Gibbs  MRN: [id number] DOB: 09-Nov-1946  Subjective:  HPI   The patient is a 73 year old female who was scheduled for her wellness exam today.  However, she has been hospitalized and has palliative home care in place since her last visit.  Her visit today will be as a hospital follow up so that we may update our records since her changes.  Patient is currently on 3 LPM of oxygen.  She is concerned that her blood pressure is going up.  She has not been on any blood pressure medications since they had taken her off in the hospital.   Patient Active Problem List   Diagnosis Date Noted   Palliative care encounter 06/29/2019   Shortness of breath 06/29/2019   Generalized weakness 06/29/2019   Community acquired pneumonia    Advanced care planning/counseling discussion    Goals of care, counseling/discussion    Chronic respiratory failure with hypoxia, on home O2 therapy (HCC) 04/23/2019   Osteoporosis 09/01/2018   Acute respiratory failure (HCC)    Palliative care by specialist    UTI (urinary tract infection) 01/17/2018   Sepsis (HCC) 11/07/2016   COPD exacerbation (HCC) 10/26/2016   Abnormal chest sounds 08/29/2015   Obstructive chronic bronchitis with exacerbation (HCC) 08/29/2015   Anxiety and depression 08/29/2015   BP (high blood pressure) 08/29/2015   Carotid stenosis 06/23/2015   DNR (do not resuscitate) discussion 05/30/2015   Tobacco use 05/30/2015   Hyperlipidemia    Past Medical History:  Diagnosis Date   Abnormal chest sounds 08/29/2015   Acute respiratory failure (HCC)    Anemia    Anxiety    BP (high blood pressure) 08/29/2015   Carotid artery occlusion    Carotid stenosis 06/23/2015   COPD (chronic obstructive pulmonary disease) (HCC)    COPD exacerbation (HCC) 10/26/2016   Coronary artery disease    Depression    Dysrhythmia    GERD (gastroesophageal reflux disease)    History of orthopnea    HOH (hard of  hearing)    Hyperlipidemia    Hypertension    Obstructive chronic bronchitis with exacerbation (HCC) 08/29/2015   Osteoporosis    Oxygen dependent    2L/MIN CONTINUOUS   Palliative care by specialist    Palpitations    Seasonal allergies    Shortness of breath dyspnea    Social History   Socioeconomic History   Marital status: Divorced    Spouse name: Not on file   Number of children: 2   Years of education: Not on file   Highest education level: 12th grade  Occupational History   Occupation: retired  Ecologistocial Needs   Financial resource strain: Not hard at [employer]   Food insecurity    Worry: Never true    Inability: Never true   Transportation needs    Medical: No    Non-medical: No  Tobacco Use   Smoking status: Current Every Day Smoker    Packs/day: 1.50    Years: 55.00    Pack years: 82.50    Types: Cigarettes   Smokeless tobacco: Never Used  Substance and Sexual Activity   Alcohol use: No    Alcohol/week: 0.0 standard drinks   Drug use: Yes    Comment: prescribed   Sexual activity: Not on file  Lifestyle   Physical activity    Days per week: 0 days    Minutes per session: 0 min   Stress: Only a little  Relationships  Social Musicianconnections    Talks on phone: Patient refused    Gets together: Patient refused    Attends religious service: Patient refused    Active member of club or organization: Patient refused    Attends meetings of clubs or organizations: Patient refused    Relationship status: Patient refused   Intimate partner violence    Fear of current or ex partner: Patient refused    Emotionally abused: Patient refused    Physically abused: Patient refused    Forced sexual activity: Patient refused  Other Topics Concern   Not on file  Social History Narrative   Not on file   Outpatient Encounter Medications as of 07/20/2019  Medication Sig   acetaminophen (TYLENOL) 500 MG tablet Take 1,000 mg by mouth every 6 (six) hours  as needed for mild pain or headache.    ALPRAZolam (XANAX) 0.25 MG tablet Take 1 tablet (0.25 mg total) by mouth 3 (three) times daily as needed for anxiety.   aspirin 81 MG chewable tablet Chew 81 mg by mouth at bedtime.    atorvastatin (LIPITOR) 10 MG tablet Take 1 tablet (10 mg total) by mouth at bedtime.   bisacodyl (BISACODYL) 5 MG EC tablet Take 5 mg by mouth daily as needed for moderate constipation.   calcium carbonate (OSCAL) 1500 (600 Ca) MG TABS tablet Take 600 mg of elemental calcium by mouth 2 (two) times daily.    cetirizine (ZYRTEC) 10 MG tablet Take 10 mg by mouth at bedtime.    Cholecalciferol (VITAMIN D3) 2000 units TABS Take 2,000 Units by mouth at bedtime.    feeding supplement, ENSURE ENLIVE, (ENSURE ENLIVE) LIQD Take 237 mLs by mouth 2 (two) times daily between meals.   ferrous sulfate 325 (65 FE) MG tablet Take 325 mg by mouth at bedtime.    HYDROcodone-acetaminophen (NORCO/VICODIN) 5-325 MG tablet Take 1 tablet by mouth 2 (two) times daily as needed for moderate pain.   ipratropium-albuterol (DUONEB) 0.5-2.5 (3) MG/3ML SOLN Take 3 mLs by nebulization every 6 (six) hours as needed.   Multiple Vitamin (MULTIVITAMIN) tablet Take 1 tablet by mouth at bedtime.    predniSONE (STERAPRED UNI-PAK 21 TAB) 10 MG (21) TBPK tablet Take 6 tabs first day, 5 tab on day 2, then 4 on day 3rd, 3 tabs on day 4th , 2 tab on day 5th, and 1 tab on 6th day.   sertraline (ZOLOFT) 100 MG tablet Take 100 mg by mouth daily.   sodium chloride (OCEAN) 0.65 % SOLN nasal spray Place 1 spray into both nostrils as needed (dryness).   SUPER B COMPLEX/C PO Take 1 tablet by mouth at bedtime.   SYMBICORT 160-4.5 MCG/ACT inhaler INHALE 2 PUFFS INTO THE LUNGS 2 (TWO) TIMES DAILY.   tiotropium (SPIRIVA) 18 MCG inhalation capsule Place 18 mcg into inhaler and inhale daily.   VENTOLIN HFA 108 (90 Base) MCG/ACT inhaler TAKE 2 PUFFS BY MOUTH EVERY 6 HOURS AS NEEDED FOR WHEEZE OR SHORTNESS OF BREATH     vitamin E 400 UNIT capsule Take 400 Units by mouth at bedtime.    No facility-administered encounter medications on file as of 07/20/2019.     Allergies  Allergen Reactions   Codeine Anxiety         Review of Systems  Constitutional: Negative.   HENT: Negative.   Eyes: Negative.   Respiratory: Positive for shortness of breath and wheezing.   Cardiovascular: Positive for leg swelling.  Gastrointestinal: Negative.   Genitourinary: Negative.  Musculoskeletal: Negative.   Skin: Negative.   Neurological: Positive for weakness.  Endo/Heme/Allergies: Positive for environmental allergies. Bruises/bleeds easily.  Psychiatric/Behavioral: Negative.     Objective:  BP (!) 146/62 (BP Location: Right Arm, Patient Position: Sitting, Cuff Size: Normal)    Pulse 77    Temp 97.7 F (36.5 C) (Oral)    Wt 106 lb (48.1 kg)    SpO2 96%    BMI 19.39 kg/m   Physical Exam  Constitutional: She is oriented to person, place, and time and well-developed, well-nourished, and in no distress.  Cachexia without significant distress.  HENT:  Head: Normocephalic.  Eyes: Conjunctivae are normal.  Neck: Neck supple.  Cardiovascular:  Slight tachycardia after walking into the office today.  Pulmonary/Chest: Effort normal. She has no wheezes. She has no rales. She exhibits no tenderness.  No significant respiratory distress on the 2.5 - 3 LPM oxygen by nasal cannula.  Abdominal: Soft.  Musculoskeletal: Normal range of motion.  Neurological: She is alert and oriented to person, place, and time.  Skin: No rash noted.  Multiple bruises on both arms since hospitalization.  Psychiatric: Mood, affect and judgment normal.    Assessment and Plan :  1. Anemia, unspecified type Very thin and weak. During hospitalization, hemoglobin was 9.2 with hematocrit 30.2. Presently on Ferrous sulfate 325 mg hs. Continues Ensure supplements twice a day. Encouraged to continue 3 regular meals daily, also. Recheck CBC, CMP  and Iron level. Denies melena, hematochezia or hematochezia. Bruises easily and generalized weakness.  2. Chronic respiratory failure with hypoxia, on home O2 therapy (HCC) Presently on 2.5-3 LPM of oxygen by nasal cannula 24 hours a day. Continues to use Spiriva 18 mcg qd, Prednisone 10 mg 6 day dose pack, Duoneb by nebulizer QID prn and Albuterol-HFA 2 puffs QID prn wheeze. Feels this regimen is helping but dyspnea always worse with walking much distance. Drove herself to this appointment alone today and had to be rolled to her car in a wheelchair. Pulse oximetry 96% sitting in a chair with oxygen. Continues to smoke 1.5 ppd. Continue home health follow up with poor prognosis.  3. Hx of bacterial pneumonia Hospitalized for shortness of breath with diagnosis of clinical sepsis with multifocal pneumonia and tachycardia. Treated with Rocephin and Zithromax with Solu-Medrol and nebulizer treatments. COVID-19 test was negative and responded well to this treatment. Need to recheck CXR and CBC with diff.  4. Anxiety and depression Denies suicidal ideation and panic attacks. Feels the Xanax 0.25 mg TID and Sertraline 100 mg qd helping to control anxiety since starting it during her hospitalization. May use Xanax prn but must monitor pulse oximetry for adverse effects on respiratory failure.  5. Mixed hyperlipidemia Tolerating Atorvastatin 10 mg hs. Will recheck CMP and Lipid panel.  6. Generalized weakness Persistent since hospitalization for COPD, CAP and Sepsis 06-19-19 to 06-24-19. Will recheck CBC, CMP and continue home health care. Encouraged to drink plenty of fluids and regular diet with feeding supplements twice a day between meals. Using wheelchair to get into the office today and rolling walker when not available.

## 2019-07-23 DIAGNOSIS — J441 Chronic obstructive pulmonary disease with (acute) exacerbation: Secondary | ICD-10-CM | POA: Diagnosis not present

## 2019-07-23 DIAGNOSIS — D649 Anemia, unspecified: Secondary | ICD-10-CM | POA: Diagnosis not present

## 2019-07-23 DIAGNOSIS — R2689 Other abnormalities of gait and mobility: Secondary | ICD-10-CM | POA: Diagnosis not present

## 2019-07-23 DIAGNOSIS — M810 Age-related osteoporosis without current pathological fracture: Secondary | ICD-10-CM | POA: Diagnosis not present

## 2019-07-23 DIAGNOSIS — J9611 Chronic respiratory failure with hypoxia: Secondary | ICD-10-CM | POA: Diagnosis not present

## 2019-07-23 DIAGNOSIS — I2510 Atherosclerotic heart disease of native coronary artery without angina pectoris: Secondary | ICD-10-CM | POA: Diagnosis not present

## 2019-07-23 DIAGNOSIS — F419 Anxiety disorder, unspecified: Secondary | ICD-10-CM | POA: Diagnosis not present

## 2019-07-23 DIAGNOSIS — G629 Polyneuropathy, unspecified: Secondary | ICD-10-CM | POA: Diagnosis not present

## 2019-07-23 DIAGNOSIS — I10 Essential (primary) hypertension: Secondary | ICD-10-CM | POA: Diagnosis not present

## 2019-07-28 ENCOUNTER — Other Ambulatory Visit: Payer: Self-pay | Admitting: Family Medicine

## 2019-08-02 ENCOUNTER — Encounter: Payer: Self-pay | Admitting: Family Medicine

## 2019-08-11 DEATH — deceased

## 2019-09-16 ENCOUNTER — Other Ambulatory Visit: Payer: Self-pay | Admitting: Family Medicine

## 2020-05-02 ENCOUNTER — Ambulatory Visit: Payer: Medicare HMO
